# Patient Record
Sex: Female | Born: 1955 | Race: Black or African American | Hispanic: No | Smoking: Current every day smoker
Health system: Southern US, Community
[De-identification: ages and names within clinical notes are randomized; demographics above are authoritative.]

## PROBLEM LIST (undated history)

## (undated) DIAGNOSIS — M109 Gout, unspecified: Secondary | ICD-10-CM

## (undated) DIAGNOSIS — F329 Major depressive disorder, single episode, unspecified: Secondary | ICD-10-CM

## (undated) DIAGNOSIS — F209 Schizophrenia, unspecified: Secondary | ICD-10-CM

## (undated) DIAGNOSIS — I10 Essential (primary) hypertension: Secondary | ICD-10-CM

## (undated) DIAGNOSIS — M549 Dorsalgia, unspecified: Secondary | ICD-10-CM

## (undated) DIAGNOSIS — G8929 Other chronic pain: Secondary | ICD-10-CM

## (undated) DIAGNOSIS — M1990 Unspecified osteoarthritis, unspecified site: Secondary | ICD-10-CM

## (undated) DIAGNOSIS — E119 Type 2 diabetes mellitus without complications: Secondary | ICD-10-CM

## (undated) DIAGNOSIS — F32A Depression, unspecified: Secondary | ICD-10-CM

## (undated) HISTORY — DX: Essential (primary) hypertension: I10

## (undated) HISTORY — PX: BREAST BIOPSY: SHX20

---

## 1997-06-16 ENCOUNTER — Encounter: Admission: RE | Admit: 1997-06-16 | Discharge: 1997-06-16 | Payer: Self-pay | Admitting: Family Medicine

## 1997-07-09 ENCOUNTER — Encounter: Admission: RE | Admit: 1997-07-09 | Discharge: 1997-07-09 | Payer: Self-pay | Admitting: Family Medicine

## 1997-10-05 ENCOUNTER — Emergency Department (HOSPITAL_COMMUNITY): Admission: EM | Admit: 1997-10-05 | Discharge: 1997-10-05 | Payer: Self-pay | Admitting: Emergency Medicine

## 1997-10-15 ENCOUNTER — Ambulatory Visit (HOSPITAL_COMMUNITY): Admission: RE | Admit: 1997-10-15 | Discharge: 1997-10-16 | Payer: Self-pay | Admitting: General Surgery

## 1997-10-29 ENCOUNTER — Encounter: Payer: Self-pay | Admitting: General Surgery

## 1997-10-29 ENCOUNTER — Ambulatory Visit (HOSPITAL_COMMUNITY): Admission: RE | Admit: 1997-10-29 | Discharge: 1997-10-29 | Payer: Self-pay | Admitting: General Surgery

## 1998-01-04 ENCOUNTER — Other Ambulatory Visit: Admission: RE | Admit: 1998-01-04 | Discharge: 1998-01-04 | Payer: Self-pay | Admitting: *Deleted

## 1998-01-04 ENCOUNTER — Encounter: Admission: RE | Admit: 1998-01-04 | Discharge: 1998-01-04 | Payer: Self-pay | Admitting: Family Medicine

## 1998-02-17 ENCOUNTER — Encounter: Admission: RE | Admit: 1998-02-17 | Discharge: 1998-02-17 | Payer: Self-pay | Admitting: Family Medicine

## 1998-03-15 ENCOUNTER — Encounter: Admission: RE | Admit: 1998-03-15 | Discharge: 1998-03-15 | Payer: Self-pay | Admitting: Sports Medicine

## 1998-04-05 ENCOUNTER — Encounter: Admission: RE | Admit: 1998-04-05 | Discharge: 1998-04-05 | Payer: Self-pay | Admitting: Family Medicine

## 1998-04-12 ENCOUNTER — Encounter: Admission: RE | Admit: 1998-04-12 | Discharge: 1998-04-12 | Payer: Self-pay | Admitting: Sports Medicine

## 1998-04-19 ENCOUNTER — Encounter: Admission: RE | Admit: 1998-04-19 | Discharge: 1998-04-19 | Payer: Self-pay | Admitting: Sports Medicine

## 1998-04-26 ENCOUNTER — Encounter: Admission: RE | Admit: 1998-04-26 | Discharge: 1998-04-26 | Payer: Self-pay | Admitting: Sports Medicine

## 1998-05-03 ENCOUNTER — Encounter: Admission: RE | Admit: 1998-05-03 | Discharge: 1998-05-03 | Payer: Self-pay | Admitting: Family Medicine

## 1998-05-20 ENCOUNTER — Encounter: Admission: RE | Admit: 1998-05-20 | Discharge: 1998-05-20 | Payer: Self-pay | Admitting: Family Medicine

## 1998-07-18 ENCOUNTER — Encounter: Admission: RE | Admit: 1998-07-18 | Discharge: 1998-07-18 | Payer: Self-pay | Admitting: Family Medicine

## 1998-07-27 ENCOUNTER — Encounter: Admission: RE | Admit: 1998-07-27 | Discharge: 1998-07-27 | Payer: Self-pay | Admitting: Family Medicine

## 1998-08-04 ENCOUNTER — Encounter: Admission: RE | Admit: 1998-08-04 | Discharge: 1998-10-03 | Payer: Self-pay | Admitting: *Deleted

## 1998-08-29 ENCOUNTER — Encounter: Admission: RE | Admit: 1998-08-29 | Discharge: 1998-08-29 | Payer: Self-pay | Admitting: Family Medicine

## 1998-09-22 ENCOUNTER — Encounter: Admission: RE | Admit: 1998-09-22 | Discharge: 1998-09-22 | Payer: Self-pay | Admitting: Family Medicine

## 1998-10-04 ENCOUNTER — Encounter: Admission: RE | Admit: 1998-10-04 | Discharge: 1999-01-02 | Payer: Self-pay | Admitting: *Deleted

## 1998-10-14 ENCOUNTER — Encounter: Admission: RE | Admit: 1998-10-14 | Discharge: 1998-10-14 | Payer: Self-pay | Admitting: Family Medicine

## 1998-11-09 ENCOUNTER — Encounter: Admission: RE | Admit: 1998-11-09 | Discharge: 1998-11-09 | Payer: Self-pay | Admitting: Family Medicine

## 1998-11-23 ENCOUNTER — Encounter: Admission: RE | Admit: 1998-11-23 | Discharge: 1998-11-23 | Payer: Self-pay | Admitting: Family Medicine

## 1999-01-09 ENCOUNTER — Encounter: Admission: RE | Admit: 1999-01-09 | Discharge: 1999-01-09 | Payer: Self-pay | Admitting: Family Medicine

## 1999-01-16 ENCOUNTER — Encounter: Admission: RE | Admit: 1999-01-16 | Discharge: 1999-04-16 | Payer: Self-pay | Admitting: *Deleted

## 1999-01-19 ENCOUNTER — Encounter: Admission: RE | Admit: 1999-01-19 | Discharge: 1999-01-19 | Payer: Self-pay | Admitting: Family Medicine

## 1999-02-22 ENCOUNTER — Encounter: Admission: RE | Admit: 1999-02-22 | Discharge: 1999-02-22 | Payer: Self-pay | Admitting: Family Medicine

## 1999-03-17 ENCOUNTER — Encounter: Admission: RE | Admit: 1999-03-17 | Discharge: 1999-03-17 | Payer: Self-pay | Admitting: Family Medicine

## 1999-03-29 ENCOUNTER — Encounter: Admission: RE | Admit: 1999-03-29 | Discharge: 1999-03-29 | Payer: Self-pay | Admitting: Family Medicine

## 1999-04-24 ENCOUNTER — Encounter: Admission: RE | Admit: 1999-04-24 | Discharge: 1999-07-23 | Payer: Self-pay | Admitting: *Deleted

## 1999-05-03 ENCOUNTER — Encounter: Admission: RE | Admit: 1999-05-03 | Discharge: 1999-05-03 | Payer: Self-pay | Admitting: Family Medicine

## 1999-07-19 ENCOUNTER — Encounter: Admission: RE | Admit: 1999-07-19 | Discharge: 1999-07-19 | Payer: Self-pay | Admitting: Family Medicine

## 1999-07-24 ENCOUNTER — Encounter: Admission: RE | Admit: 1999-07-24 | Discharge: 1999-10-22 | Payer: Self-pay | Admitting: *Deleted

## 1999-07-26 ENCOUNTER — Encounter: Admission: RE | Admit: 1999-07-26 | Discharge: 1999-07-26 | Payer: Self-pay | Admitting: Family Medicine

## 1999-08-29 ENCOUNTER — Encounter: Admission: RE | Admit: 1999-08-29 | Discharge: 1999-08-29 | Payer: Self-pay | Admitting: Sports Medicine

## 1999-10-16 ENCOUNTER — Encounter: Admission: RE | Admit: 1999-10-16 | Discharge: 1999-10-16 | Payer: Self-pay | Admitting: Family Medicine

## 1999-10-24 ENCOUNTER — Encounter: Admission: RE | Admit: 1999-10-24 | Discharge: 2000-01-22 | Payer: Self-pay

## 1999-11-01 ENCOUNTER — Encounter: Admission: RE | Admit: 1999-11-01 | Discharge: 1999-11-01 | Payer: Self-pay | Admitting: Family Medicine

## 1999-11-17 ENCOUNTER — Encounter: Admission: RE | Admit: 1999-11-17 | Discharge: 1999-11-17 | Payer: Self-pay | Admitting: Family Medicine

## 1999-12-07 ENCOUNTER — Encounter: Admission: RE | Admit: 1999-12-07 | Discharge: 1999-12-07 | Payer: Self-pay | Admitting: Family Medicine

## 1999-12-19 ENCOUNTER — Encounter: Admission: RE | Admit: 1999-12-19 | Discharge: 1999-12-19 | Payer: Self-pay | Admitting: Family Medicine

## 2000-01-12 ENCOUNTER — Ambulatory Visit (HOSPITAL_COMMUNITY): Admission: RE | Admit: 2000-01-12 | Discharge: 2000-01-12 | Payer: Self-pay | Admitting: Family Medicine

## 2000-01-12 ENCOUNTER — Encounter: Admission: RE | Admit: 2000-01-12 | Discharge: 2000-01-12 | Payer: Self-pay | Admitting: Family Medicine

## 2000-01-12 ENCOUNTER — Encounter: Payer: Self-pay | Admitting: Family Medicine

## 2000-02-23 ENCOUNTER — Emergency Department (HOSPITAL_COMMUNITY): Admission: EM | Admit: 2000-02-23 | Discharge: 2000-02-23 | Payer: Self-pay | Admitting: Emergency Medicine

## 2000-03-04 ENCOUNTER — Encounter: Admission: RE | Admit: 2000-03-04 | Discharge: 2000-03-04 | Payer: Self-pay | Admitting: Family Medicine

## 2000-03-11 ENCOUNTER — Encounter: Admission: RE | Admit: 2000-03-11 | Discharge: 2000-03-11 | Payer: Self-pay | Admitting: Family Medicine

## 2000-03-29 ENCOUNTER — Encounter: Admission: RE | Admit: 2000-03-29 | Discharge: 2000-03-29 | Payer: Self-pay | Admitting: Family Medicine

## 2000-04-01 ENCOUNTER — Encounter: Admission: RE | Admit: 2000-04-01 | Discharge: 2000-04-01 | Payer: Self-pay | Admitting: Family Medicine

## 2000-04-02 ENCOUNTER — Encounter: Admission: RE | Admit: 2000-04-02 | Discharge: 2000-04-02 | Payer: Self-pay | Admitting: Family Medicine

## 2000-04-02 ENCOUNTER — Encounter: Payer: Self-pay | Admitting: Family Medicine

## 2000-04-08 ENCOUNTER — Encounter: Admission: RE | Admit: 2000-04-08 | Discharge: 2000-04-08 | Payer: Self-pay | Admitting: Family Medicine

## 2000-04-23 ENCOUNTER — Encounter: Admission: RE | Admit: 2000-04-23 | Discharge: 2000-04-23 | Payer: Self-pay | Admitting: Sports Medicine

## 2000-06-25 ENCOUNTER — Encounter: Admission: RE | Admit: 2000-06-25 | Discharge: 2000-06-25 | Payer: Self-pay | Admitting: Sports Medicine

## 2000-08-19 ENCOUNTER — Encounter: Admission: RE | Admit: 2000-08-19 | Discharge: 2000-08-19 | Payer: Self-pay | Admitting: Family Medicine

## 2000-09-03 ENCOUNTER — Encounter: Admission: RE | Admit: 2000-09-03 | Discharge: 2000-09-03 | Payer: Self-pay | Admitting: Family Medicine

## 2000-09-17 ENCOUNTER — Encounter: Admission: RE | Admit: 2000-09-17 | Discharge: 2000-09-17 | Payer: Self-pay | Admitting: Family Medicine

## 2000-09-19 ENCOUNTER — Inpatient Hospital Stay (HOSPITAL_COMMUNITY): Admission: EM | Admit: 2000-09-19 | Discharge: 2000-09-22 | Payer: Self-pay | Admitting: *Deleted

## 2000-09-19 ENCOUNTER — Encounter: Admission: RE | Admit: 2000-09-19 | Discharge: 2000-09-19 | Payer: Self-pay | Admitting: Family Medicine

## 2000-10-07 ENCOUNTER — Encounter: Admission: RE | Admit: 2000-10-07 | Discharge: 2000-10-07 | Payer: Self-pay | Admitting: Family Medicine

## 2000-10-18 ENCOUNTER — Encounter: Admission: RE | Admit: 2000-10-18 | Discharge: 2000-10-18 | Payer: Self-pay | Admitting: Family Medicine

## 2000-10-21 ENCOUNTER — Encounter: Payer: Self-pay | Admitting: Emergency Medicine

## 2000-10-21 ENCOUNTER — Emergency Department (HOSPITAL_COMMUNITY): Admission: EM | Admit: 2000-10-21 | Discharge: 2000-10-21 | Payer: Self-pay | Admitting: Emergency Medicine

## 2000-11-12 ENCOUNTER — Encounter: Admission: RE | Admit: 2000-11-12 | Discharge: 2000-11-12 | Payer: Self-pay | Admitting: Family Medicine

## 2000-11-14 ENCOUNTER — Encounter: Admission: RE | Admit: 2000-11-14 | Discharge: 2000-11-14 | Payer: Self-pay | Admitting: Family Medicine

## 2001-01-16 ENCOUNTER — Encounter: Admission: RE | Admit: 2001-01-16 | Discharge: 2001-01-16 | Payer: Self-pay | Admitting: Family Medicine

## 2001-02-17 ENCOUNTER — Encounter: Admission: RE | Admit: 2001-02-17 | Discharge: 2001-02-17 | Payer: Self-pay | Admitting: Family Medicine

## 2001-02-17 ENCOUNTER — Encounter: Payer: Self-pay | Admitting: Family Medicine

## 2001-03-14 ENCOUNTER — Encounter: Admission: RE | Admit: 2001-03-14 | Discharge: 2001-03-14 | Payer: Self-pay | Admitting: Family Medicine

## 2001-03-17 ENCOUNTER — Encounter: Admission: RE | Admit: 2001-03-17 | Discharge: 2001-03-17 | Payer: Self-pay | Admitting: Family Medicine

## 2001-03-25 ENCOUNTER — Encounter: Admission: RE | Admit: 2001-03-25 | Discharge: 2001-03-25 | Payer: Self-pay | Admitting: Family Medicine

## 2001-03-25 ENCOUNTER — Ambulatory Visit (HOSPITAL_COMMUNITY): Admission: RE | Admit: 2001-03-25 | Discharge: 2001-03-25 | Payer: Self-pay | Admitting: Family Medicine

## 2001-04-18 ENCOUNTER — Encounter: Admission: RE | Admit: 2001-04-18 | Discharge: 2001-04-18 | Payer: Self-pay | Admitting: Family Medicine

## 2001-06-04 ENCOUNTER — Encounter: Admission: RE | Admit: 2001-06-04 | Discharge: 2001-06-04 | Payer: Self-pay | Admitting: Family Medicine

## 2001-06-06 ENCOUNTER — Encounter: Admission: RE | Admit: 2001-06-06 | Discharge: 2001-06-06 | Payer: Self-pay | Admitting: Family Medicine

## 2001-07-03 ENCOUNTER — Encounter: Admission: RE | Admit: 2001-07-03 | Discharge: 2001-07-03 | Payer: Self-pay | Admitting: Family Medicine

## 2001-07-18 ENCOUNTER — Encounter: Admission: RE | Admit: 2001-07-18 | Discharge: 2001-07-18 | Payer: Self-pay | Admitting: Family Medicine

## 2001-08-05 ENCOUNTER — Encounter: Admission: RE | Admit: 2001-08-05 | Discharge: 2001-08-05 | Payer: Self-pay | Admitting: Family Medicine

## 2001-10-06 ENCOUNTER — Encounter: Admission: RE | Admit: 2001-10-06 | Discharge: 2001-10-06 | Payer: Self-pay | Admitting: Family Medicine

## 2001-11-07 ENCOUNTER — Encounter: Admission: RE | Admit: 2001-11-07 | Discharge: 2001-11-07 | Payer: Self-pay | Admitting: Family Medicine

## 2001-11-18 ENCOUNTER — Encounter: Admission: RE | Admit: 2001-11-18 | Discharge: 2001-11-18 | Payer: Self-pay | Admitting: Sports Medicine

## 2001-12-01 ENCOUNTER — Encounter: Admission: RE | Admit: 2001-12-01 | Discharge: 2001-12-01 | Payer: Self-pay | Admitting: Sports Medicine

## 2001-12-16 ENCOUNTER — Encounter: Admission: RE | Admit: 2001-12-16 | Discharge: 2001-12-16 | Payer: Self-pay | Admitting: *Deleted

## 2001-12-16 ENCOUNTER — Encounter: Admission: RE | Admit: 2001-12-16 | Discharge: 2001-12-16 | Payer: Self-pay | Admitting: Family Medicine

## 2001-12-16 ENCOUNTER — Encounter: Payer: Self-pay | Admitting: Sports Medicine

## 2002-01-02 ENCOUNTER — Encounter: Admission: RE | Admit: 2002-01-02 | Discharge: 2002-01-02 | Payer: Self-pay | Admitting: Family Medicine

## 2002-02-19 ENCOUNTER — Encounter: Payer: Self-pay | Admitting: Sports Medicine

## 2002-02-19 ENCOUNTER — Encounter: Admission: RE | Admit: 2002-02-19 | Discharge: 2002-02-19 | Payer: Self-pay | Admitting: Sports Medicine

## 2002-05-22 ENCOUNTER — Encounter: Admission: RE | Admit: 2002-05-22 | Discharge: 2002-05-22 | Payer: Self-pay | Admitting: Family Medicine

## 2002-06-01 ENCOUNTER — Encounter: Admission: RE | Admit: 2002-06-01 | Discharge: 2002-06-01 | Payer: Self-pay | Admitting: Family Medicine

## 2002-06-15 ENCOUNTER — Encounter: Admission: RE | Admit: 2002-06-15 | Discharge: 2002-06-15 | Payer: Self-pay | Admitting: Family Medicine

## 2002-06-25 ENCOUNTER — Encounter: Admission: RE | Admit: 2002-06-25 | Discharge: 2002-06-25 | Payer: Self-pay | Admitting: Family Medicine

## 2002-07-21 ENCOUNTER — Encounter: Admission: RE | Admit: 2002-07-21 | Discharge: 2002-07-21 | Payer: Self-pay | Admitting: Family Medicine

## 2003-05-10 ENCOUNTER — Encounter: Admission: RE | Admit: 2003-05-10 | Discharge: 2003-05-10 | Payer: Self-pay | Admitting: Sports Medicine

## 2003-05-14 ENCOUNTER — Encounter: Admission: RE | Admit: 2003-05-14 | Discharge: 2003-05-14 | Payer: Self-pay | Admitting: Family Medicine

## 2003-07-04 ENCOUNTER — Encounter (INDEPENDENT_AMBULATORY_CARE_PROVIDER_SITE_OTHER): Payer: Self-pay | Admitting: *Deleted

## 2003-07-04 LAB — CONVERTED CEMR LAB

## 2003-08-10 ENCOUNTER — Encounter: Admission: RE | Admit: 2003-08-10 | Discharge: 2003-08-10 | Payer: Self-pay | Admitting: Sports Medicine

## 2004-02-23 ENCOUNTER — Ambulatory Visit: Payer: Self-pay | Admitting: Family Medicine

## 2004-04-09 ENCOUNTER — Emergency Department (HOSPITAL_COMMUNITY): Admission: EM | Admit: 2004-04-09 | Discharge: 2004-04-09 | Payer: Self-pay | Admitting: Family Medicine

## 2004-04-18 ENCOUNTER — Ambulatory Visit: Payer: Self-pay | Admitting: Family Medicine

## 2004-04-28 ENCOUNTER — Ambulatory Visit: Payer: Self-pay | Admitting: Family Medicine

## 2004-05-10 ENCOUNTER — Encounter: Admission: RE | Admit: 2004-05-10 | Discharge: 2004-05-10 | Payer: Self-pay | Admitting: Sports Medicine

## 2004-05-24 ENCOUNTER — Ambulatory Visit: Payer: Self-pay | Admitting: Family Medicine

## 2004-06-06 ENCOUNTER — Ambulatory Visit: Payer: Self-pay | Admitting: Family Medicine

## 2004-07-04 ENCOUNTER — Ambulatory Visit: Payer: Self-pay | Admitting: Family Medicine

## 2004-07-14 ENCOUNTER — Ambulatory Visit: Payer: Self-pay | Admitting: Family Medicine

## 2004-07-24 ENCOUNTER — Ambulatory Visit: Payer: Self-pay | Admitting: Family Medicine

## 2004-08-01 ENCOUNTER — Ambulatory Visit: Payer: Self-pay | Admitting: Family Medicine

## 2004-08-16 ENCOUNTER — Ambulatory Visit: Payer: Self-pay | Admitting: Family Medicine

## 2004-08-31 ENCOUNTER — Ambulatory Visit: Payer: Self-pay | Admitting: Sports Medicine

## 2004-09-08 ENCOUNTER — Ambulatory Visit: Payer: Self-pay | Admitting: Family Medicine

## 2004-10-06 ENCOUNTER — Ambulatory Visit: Payer: Self-pay | Admitting: Family Medicine

## 2004-10-10 ENCOUNTER — Ambulatory Visit: Payer: Self-pay | Admitting: Family Medicine

## 2004-11-02 ENCOUNTER — Ambulatory Visit: Payer: Self-pay | Admitting: Family Medicine

## 2004-11-30 ENCOUNTER — Ambulatory Visit: Payer: Self-pay | Admitting: Family Medicine

## 2004-12-06 ENCOUNTER — Emergency Department (HOSPITAL_COMMUNITY): Admission: EM | Admit: 2004-12-06 | Discharge: 2004-12-06 | Payer: Self-pay | Admitting: Family Medicine

## 2004-12-08 ENCOUNTER — Ambulatory Visit: Payer: Self-pay | Admitting: Family Medicine

## 2005-01-24 ENCOUNTER — Ambulatory Visit: Payer: Self-pay | Admitting: Family Medicine

## 2005-02-20 ENCOUNTER — Ambulatory Visit: Payer: Self-pay | Admitting: Sports Medicine

## 2005-02-20 ENCOUNTER — Ambulatory Visit (HOSPITAL_COMMUNITY): Admission: RE | Admit: 2005-02-20 | Discharge: 2005-02-20 | Payer: Self-pay | Admitting: Family Medicine

## 2005-02-21 ENCOUNTER — Emergency Department (HOSPITAL_COMMUNITY): Admission: EM | Admit: 2005-02-21 | Discharge: 2005-02-21 | Payer: Self-pay | Admitting: Emergency Medicine

## 2005-02-23 ENCOUNTER — Ambulatory Visit: Payer: Self-pay | Admitting: Sports Medicine

## 2005-03-05 ENCOUNTER — Emergency Department (HOSPITAL_COMMUNITY): Admission: EM | Admit: 2005-03-05 | Discharge: 2005-03-05 | Payer: Self-pay | Admitting: Family Medicine

## 2005-03-08 ENCOUNTER — Ambulatory Visit: Payer: Self-pay | Admitting: Family Medicine

## 2005-04-09 ENCOUNTER — Ambulatory Visit: Payer: Self-pay | Admitting: Family Medicine

## 2005-04-24 ENCOUNTER — Ambulatory Visit: Payer: Self-pay | Admitting: Family Medicine

## 2005-04-26 ENCOUNTER — Ambulatory Visit: Payer: Self-pay | Admitting: Family Medicine

## 2005-05-14 ENCOUNTER — Encounter: Admission: RE | Admit: 2005-05-14 | Discharge: 2005-05-14 | Payer: Self-pay | Admitting: Sports Medicine

## 2005-05-21 ENCOUNTER — Encounter: Admission: RE | Admit: 2005-05-21 | Discharge: 2005-05-21 | Payer: Self-pay | Admitting: Sports Medicine

## 2005-05-21 ENCOUNTER — Encounter (INDEPENDENT_AMBULATORY_CARE_PROVIDER_SITE_OTHER): Payer: Self-pay | Admitting: *Deleted

## 2005-06-16 ENCOUNTER — Emergency Department (HOSPITAL_COMMUNITY): Admission: EM | Admit: 2005-06-16 | Discharge: 2005-06-16 | Payer: Self-pay | Admitting: Emergency Medicine

## 2005-06-22 ENCOUNTER — Emergency Department (HOSPITAL_COMMUNITY): Admission: EM | Admit: 2005-06-22 | Discharge: 2005-06-22 | Payer: Self-pay | Admitting: Family Medicine

## 2005-07-10 ENCOUNTER — Ambulatory Visit: Payer: Self-pay | Admitting: Sports Medicine

## 2005-07-16 ENCOUNTER — Ambulatory Visit: Payer: Self-pay | Admitting: Family Medicine

## 2005-08-26 ENCOUNTER — Emergency Department (HOSPITAL_COMMUNITY): Admission: EM | Admit: 2005-08-26 | Discharge: 2005-08-26 | Payer: Self-pay | Admitting: Emergency Medicine

## 2005-08-28 ENCOUNTER — Ambulatory Visit: Payer: Self-pay | Admitting: Family Medicine

## 2005-09-24 ENCOUNTER — Ambulatory Visit: Payer: Self-pay | Admitting: Family Medicine

## 2005-11-01 ENCOUNTER — Ambulatory Visit: Payer: Self-pay | Admitting: Family Medicine

## 2005-11-29 ENCOUNTER — Ambulatory Visit: Payer: Self-pay | Admitting: Sports Medicine

## 2005-12-25 ENCOUNTER — Ambulatory Visit: Payer: Self-pay | Admitting: Family Medicine

## 2006-01-15 ENCOUNTER — Encounter: Admission: RE | Admit: 2006-01-15 | Discharge: 2006-01-15 | Payer: Self-pay | Admitting: Sports Medicine

## 2006-01-15 ENCOUNTER — Ambulatory Visit: Payer: Self-pay | Admitting: Family Medicine

## 2006-02-01 ENCOUNTER — Encounter: Admission: RE | Admit: 2006-02-01 | Discharge: 2006-02-12 | Payer: Self-pay | Admitting: Family Medicine

## 2006-03-19 ENCOUNTER — Ambulatory Visit: Payer: Self-pay | Admitting: Family Medicine

## 2006-03-28 ENCOUNTER — Ambulatory Visit: Payer: Self-pay | Admitting: Family Medicine

## 2006-04-09 ENCOUNTER — Encounter: Payer: Self-pay | Admitting: Family Medicine

## 2006-04-09 ENCOUNTER — Ambulatory Visit: Payer: Self-pay | Admitting: Family Medicine

## 2006-04-09 LAB — CONVERTED CEMR LAB
AST: 14 units/L (ref 0–37)
Alkaline Phosphatase: 45 units/L (ref 39–117)
BUN: 11 mg/dL (ref 6–23)
CO2: 29 meq/L (ref 19–32)
Chloride: 99 meq/L (ref 96–112)
Creatinine, Ser: 0.87 mg/dL (ref 0.40–1.20)
Glucose, Bld: 103 mg/dL — ABNORMAL HIGH (ref 70–99)
Potassium: 3.9 meq/L (ref 3.5–5.3)
Total Bilirubin: 0.3 mg/dL (ref 0.3–1.2)
Total Protein: 7 g/dL (ref 6.0–8.3)

## 2006-05-02 DIAGNOSIS — I10 Essential (primary) hypertension: Secondary | ICD-10-CM | POA: Insufficient documentation

## 2006-05-02 DIAGNOSIS — K219 Gastro-esophageal reflux disease without esophagitis: Secondary | ICD-10-CM

## 2006-05-02 DIAGNOSIS — E669 Obesity, unspecified: Secondary | ICD-10-CM

## 2006-05-02 DIAGNOSIS — F209 Schizophrenia, unspecified: Secondary | ICD-10-CM | POA: Insufficient documentation

## 2006-05-03 ENCOUNTER — Encounter (INDEPENDENT_AMBULATORY_CARE_PROVIDER_SITE_OTHER): Payer: Self-pay | Admitting: *Deleted

## 2006-05-06 ENCOUNTER — Emergency Department (HOSPITAL_COMMUNITY): Admission: EM | Admit: 2006-05-06 | Discharge: 2006-05-06 | Payer: Self-pay | Admitting: Family Medicine

## 2006-05-07 ENCOUNTER — Telehealth: Payer: Self-pay | Admitting: *Deleted

## 2006-05-09 ENCOUNTER — Ambulatory Visit: Payer: Self-pay | Admitting: Sports Medicine

## 2006-05-13 ENCOUNTER — Telehealth: Payer: Self-pay | Admitting: Family Medicine

## 2006-05-16 ENCOUNTER — Telehealth: Payer: Self-pay | Admitting: Family Medicine

## 2006-05-16 ENCOUNTER — Encounter: Admission: RE | Admit: 2006-05-16 | Discharge: 2006-05-16 | Payer: Self-pay | Admitting: Sports Medicine

## 2006-05-16 ENCOUNTER — Emergency Department (HOSPITAL_COMMUNITY): Admission: EM | Admit: 2006-05-16 | Discharge: 2006-05-16 | Payer: Self-pay | Admitting: Family Medicine

## 2006-05-20 ENCOUNTER — Telehealth: Payer: Self-pay | Admitting: *Deleted

## 2006-05-20 ENCOUNTER — Emergency Department (HOSPITAL_COMMUNITY): Admission: EM | Admit: 2006-05-20 | Discharge: 2006-05-20 | Payer: Self-pay | Admitting: Family Medicine

## 2006-05-21 ENCOUNTER — Ambulatory Visit: Payer: Self-pay | Admitting: Family Medicine

## 2006-05-21 ENCOUNTER — Telehealth: Payer: Self-pay | Admitting: *Deleted

## 2006-05-23 ENCOUNTER — Telehealth: Payer: Self-pay | Admitting: Family Medicine

## 2006-05-24 ENCOUNTER — Telehealth: Payer: Self-pay | Admitting: *Deleted

## 2006-06-07 ENCOUNTER — Ambulatory Visit: Payer: Self-pay | Admitting: Family Medicine

## 2006-06-10 ENCOUNTER — Telehealth (INDEPENDENT_AMBULATORY_CARE_PROVIDER_SITE_OTHER): Payer: Self-pay | Admitting: *Deleted

## 2006-06-10 ENCOUNTER — Ambulatory Visit: Payer: Self-pay | Admitting: Family Medicine

## 2006-06-10 ENCOUNTER — Telehealth: Payer: Self-pay | Admitting: *Deleted

## 2006-06-11 ENCOUNTER — Telehealth: Payer: Self-pay | Admitting: *Deleted

## 2006-06-12 ENCOUNTER — Encounter: Payer: Self-pay | Admitting: Family Medicine

## 2006-06-12 ENCOUNTER — Ambulatory Visit: Payer: Self-pay | Admitting: Family Medicine

## 2006-06-12 LAB — CONVERTED CEMR LAB
ALT: 27 units/L (ref 0–35)
Albumin: 4.3 g/dL (ref 3.5–5.2)
Alkaline Phosphatase: 89 units/L (ref 39–117)
Basophils Absolute: <0.2 10*3/uL
Bilirubin Urine: NEGATIVE
Blood in Urine, dipstick: NEGATIVE
Creatinine, Ser: 0.75 mg/dL (ref 0.40–1.20)
Glucose, Bld: 102 mg/dL — ABNORMAL HIGH (ref 70–99)
Granulocyte count absolute: 3.8 10*3/uL
Granulocyte percent: 52.2 %
HCT: 40.2 %
Hemoglobin: 14 g/dL
Lymphocytes Relative: 40.5 %
MCV: 85 fL
Monocytes Absolute: 0.5 10*3/uL
Potassium: 3.8 meq/L (ref 3.5–5.3)
Protein, U semiquant: NEGATIVE
Specific Gravity, Urine: 1.015
Total Protein: 7.6 g/dL (ref 6.0–8.3)
Urobilinogen, UA: 0.2

## 2006-07-12 ENCOUNTER — Telehealth: Payer: Self-pay | Admitting: Family Medicine

## 2006-07-26 ENCOUNTER — Telehealth: Payer: Self-pay | Admitting: *Deleted

## 2006-07-27 ENCOUNTER — Emergency Department (HOSPITAL_COMMUNITY): Admission: EM | Admit: 2006-07-27 | Discharge: 2006-07-27 | Payer: Self-pay | Admitting: Emergency Medicine

## 2006-08-10 IMAGING — CR DG CHEST 2V
2 series · 2 of 2 positions shown · non-contrast
Comparison: 02/21/2005.

CLINICAL DATA: Chest pain for several months.    
 CHEST - 2 VIEW:

[w chest pa]
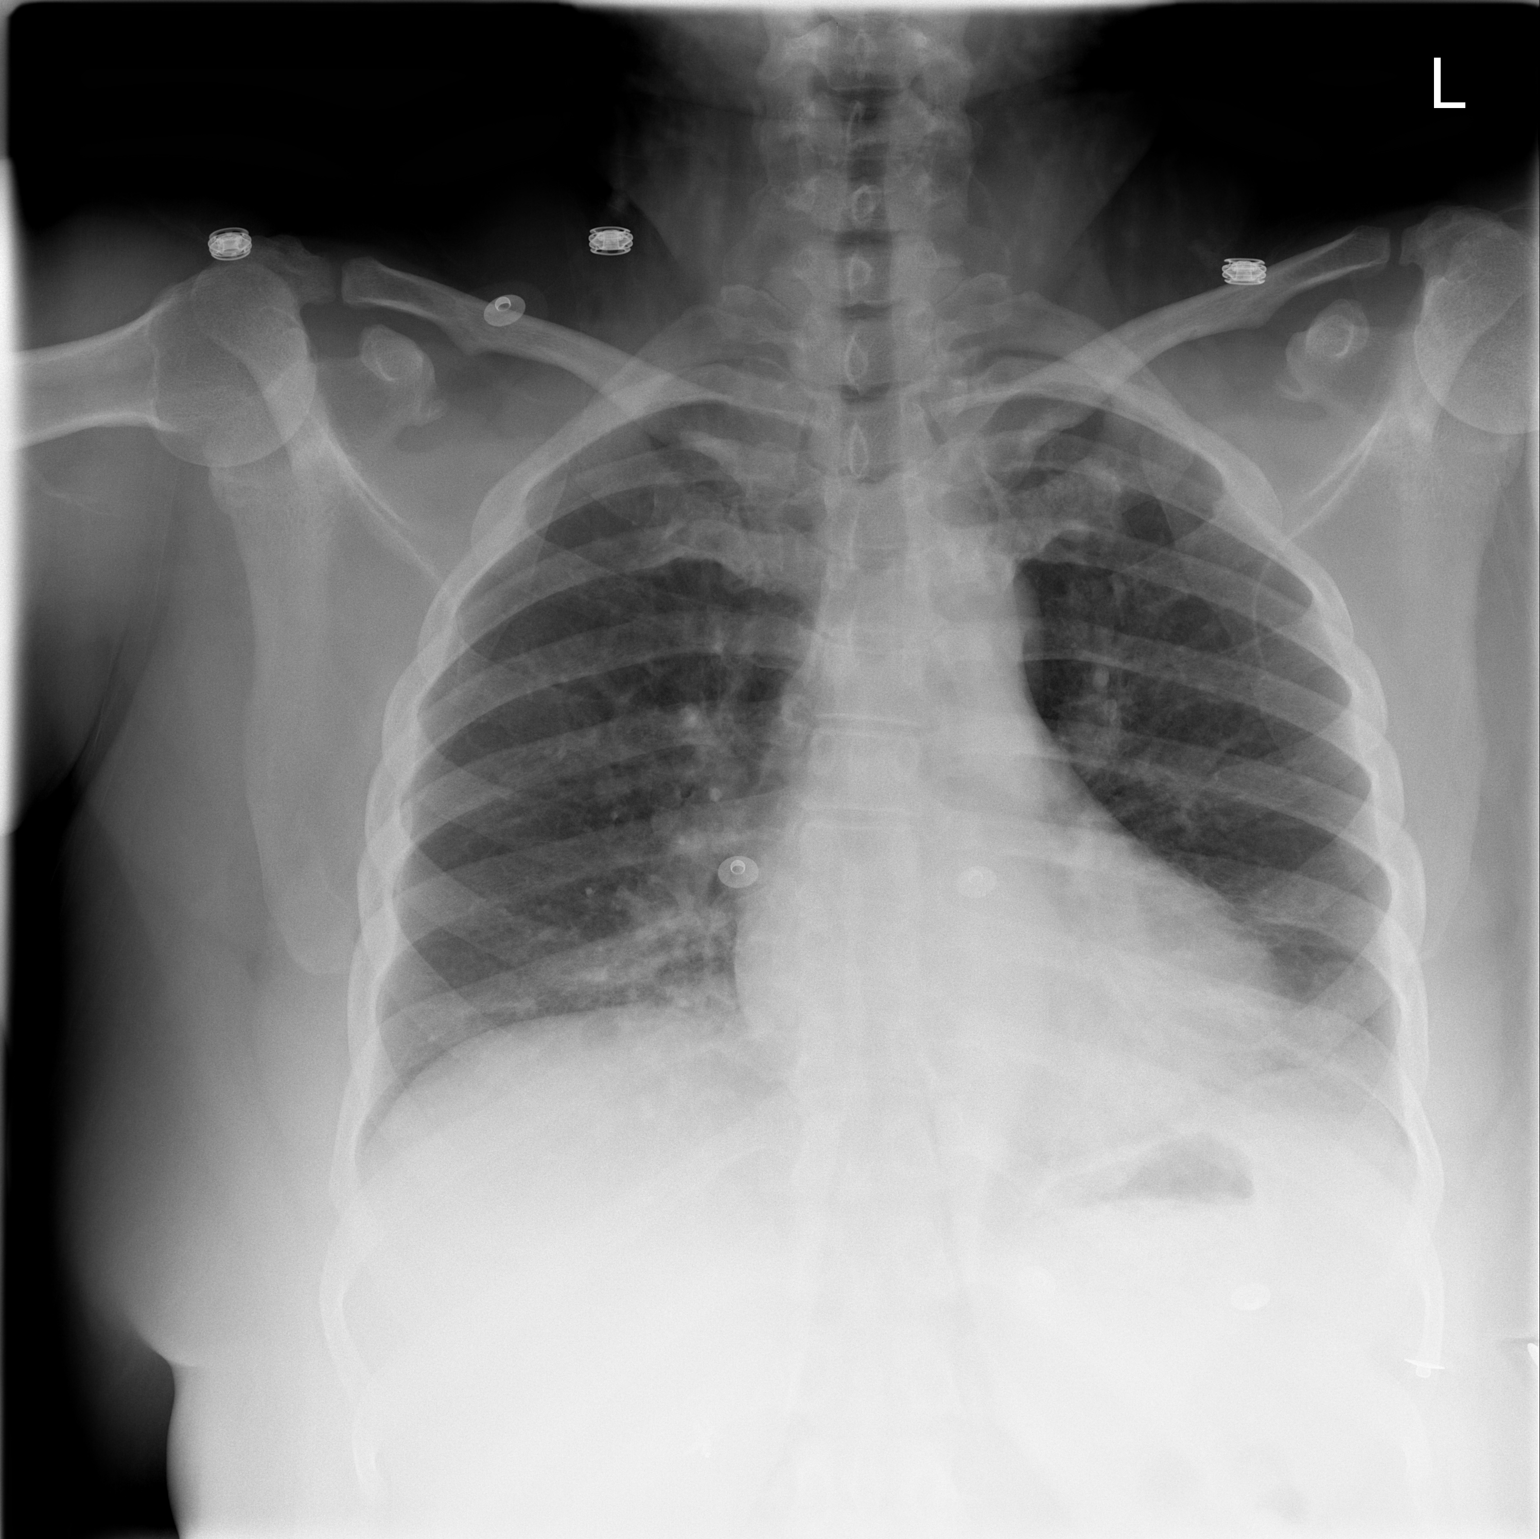

[w chest lat]
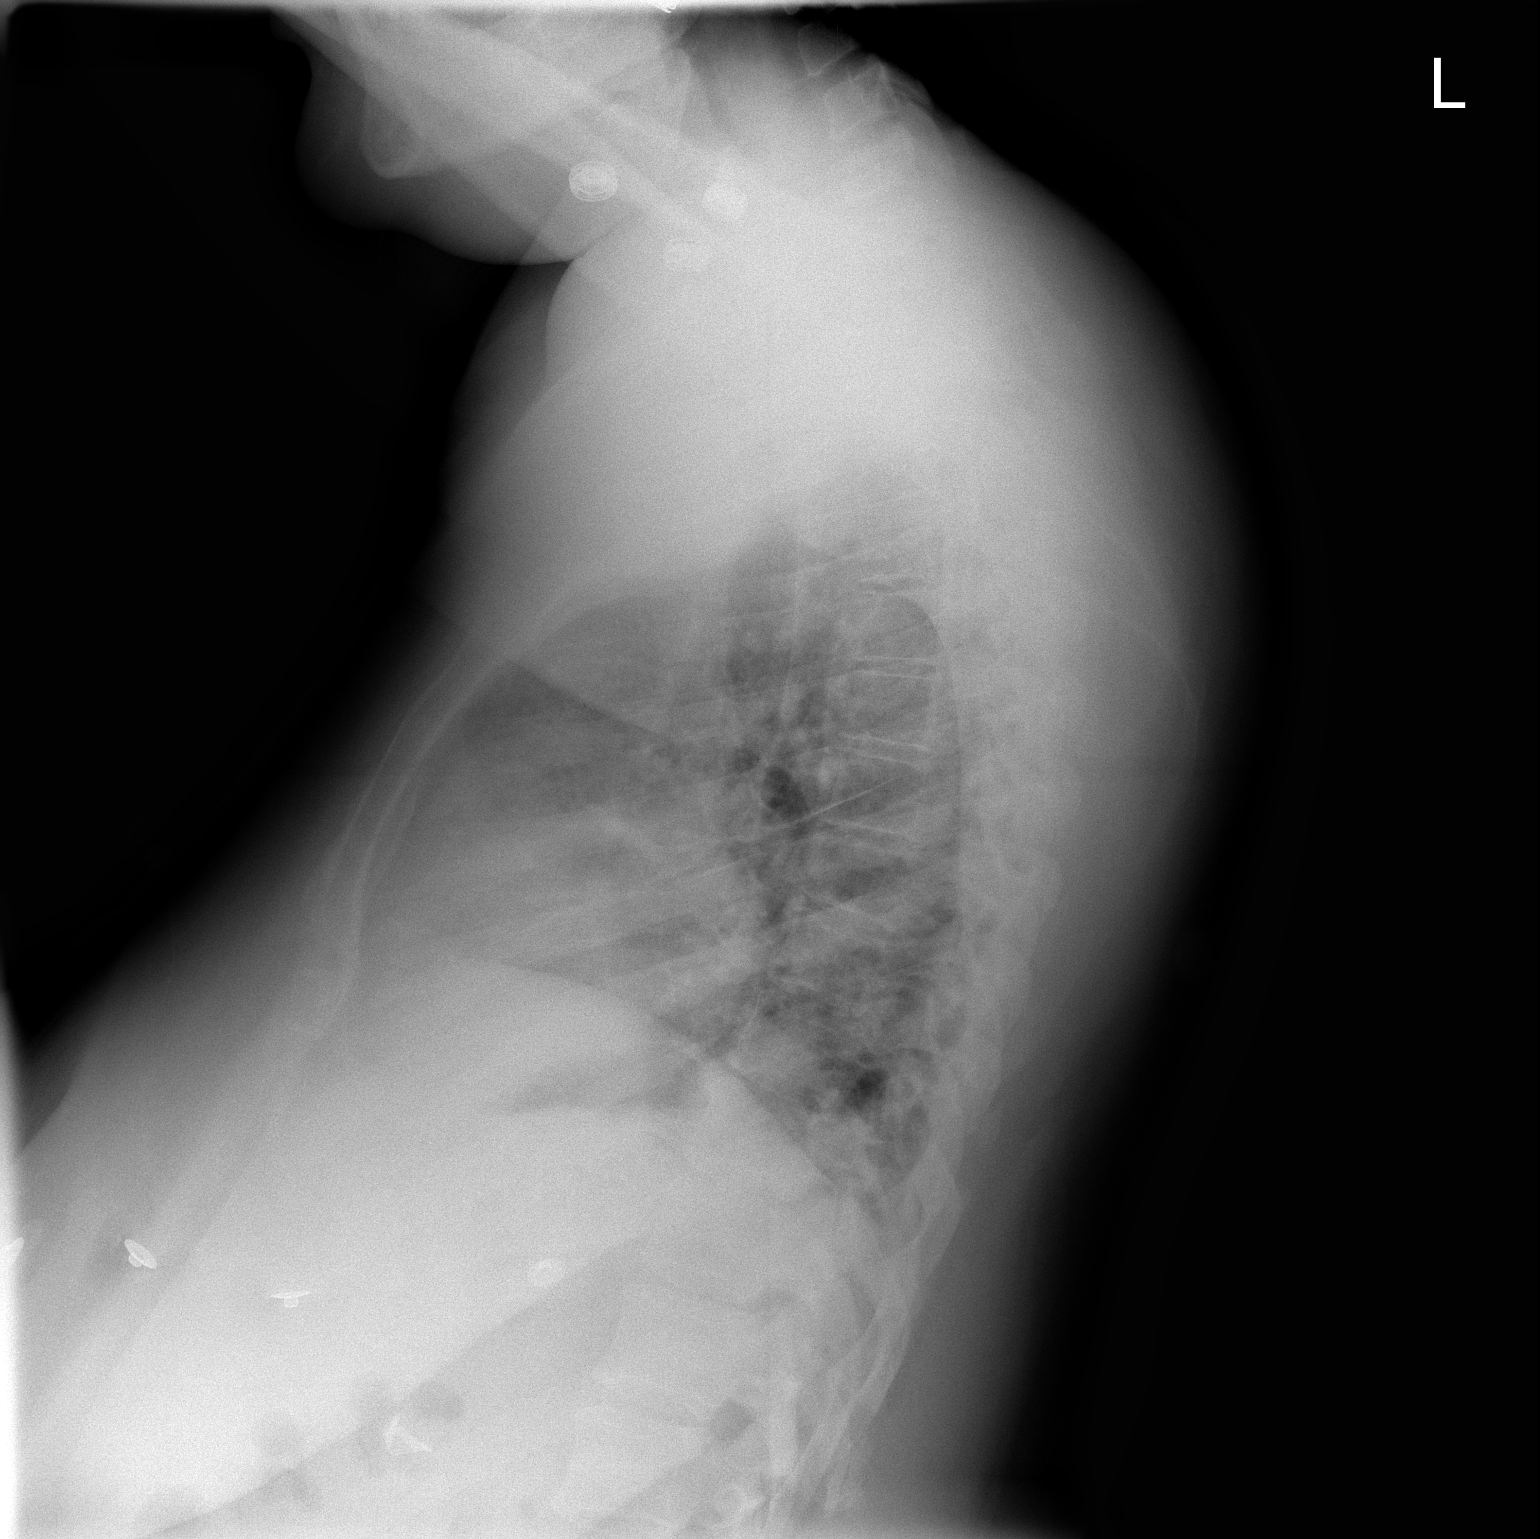

[2 of 2 positions shown; findings below may reference images not displayed]

FINDINGS: Heart size is normal.  There is bibasilar airspace disease which may be due to atelectasis and/or pneumonia.  No effusions noted.
IMPRESSION: Bibasilar airspace disease which may be due to atelectasis or infiltrate.

## 2006-08-20 ENCOUNTER — Telehealth: Payer: Self-pay | Admitting: *Deleted

## 2006-08-21 ENCOUNTER — Ambulatory Visit: Payer: Self-pay | Admitting: Family Medicine

## 2006-08-26 ENCOUNTER — Telehealth: Payer: Self-pay | Admitting: Family Medicine

## 2006-09-03 ENCOUNTER — Ambulatory Visit: Payer: Self-pay | Admitting: Family Medicine

## 2006-09-13 ENCOUNTER — Telehealth: Payer: Self-pay | Admitting: *Deleted

## 2006-09-16 ENCOUNTER — Ambulatory Visit: Payer: Self-pay | Admitting: Family Medicine

## 2006-09-20 ENCOUNTER — Telehealth: Payer: Self-pay | Admitting: Family Medicine

## 2006-10-12 ENCOUNTER — Emergency Department (HOSPITAL_COMMUNITY): Admission: EM | Admit: 2006-10-12 | Discharge: 2006-10-12 | Payer: Self-pay | Admitting: Family Medicine

## 2006-11-08 ENCOUNTER — Ambulatory Visit: Payer: Self-pay | Admitting: Family Medicine

## 2006-12-03 ENCOUNTER — Emergency Department (HOSPITAL_COMMUNITY): Admission: EM | Admit: 2006-12-03 | Discharge: 2006-12-03 | Payer: Self-pay | Admitting: Emergency Medicine

## 2006-12-12 ENCOUNTER — Telehealth (INDEPENDENT_AMBULATORY_CARE_PROVIDER_SITE_OTHER): Payer: Self-pay | Admitting: *Deleted

## 2006-12-12 ENCOUNTER — Encounter: Payer: Self-pay | Admitting: Family Medicine

## 2006-12-12 ENCOUNTER — Ambulatory Visit: Payer: Self-pay | Admitting: Family Medicine

## 2006-12-12 LAB — CONVERTED CEMR LAB
ALT: 13 units/L (ref 0–35)
AST: 15 units/L (ref 0–37)
BUN: 9 mg/dL (ref 6–23)
Basophils Absolute: 0 10*3/uL (ref 0.0–0.1)
Basophils Relative: 1 % (ref 0–1)
CO2: 28 meq/L (ref 19–32)
Creatinine, Ser: 0.77 mg/dL (ref 0.40–1.20)
Eosinophils Relative: 3 % (ref 0–5)
Glucose, Bld: 93 mg/dL (ref 70–99)
MCHC: 32.9 g/dL (ref 30.0–36.0)
Monocytes Absolute: 0.5 10*3/uL (ref 0.2–0.7)
Neutrophils Relative %: 45 % (ref 43–77)
RBC: 5.13 M/uL — ABNORMAL HIGH (ref 3.87–5.11)
WBC: 8.1 10*3/uL (ref 4.0–10.5)

## 2006-12-20 ENCOUNTER — Encounter: Payer: Self-pay | Admitting: Family Medicine

## 2006-12-27 ENCOUNTER — Telehealth: Payer: Self-pay | Admitting: *Deleted

## 2007-01-10 ENCOUNTER — Ambulatory Visit: Payer: Self-pay | Admitting: Family Medicine

## 2007-01-10 ENCOUNTER — Encounter: Payer: Self-pay | Admitting: Family Medicine

## 2007-01-10 LAB — CONVERTED CEMR LAB
ALT: 13 units/L (ref 0–35)
AST: 14 units/L (ref 0–37)
Albumin: 4.1 g/dL (ref 3.5–5.2)
Alkaline Phosphatase: 65 units/L (ref 39–117)
Blood in Urine, dipstick: NEGATIVE
Calcium: 9 mg/dL (ref 8.4–10.5)
Glucose, Bld: 96 mg/dL (ref 70–99)
Ketones, urine, test strip: NEGATIVE
MCHC: 32.5 g/dL (ref 30.0–36.0)
MCV: 87 fL (ref 78.0–100.0)
Potassium: 3.9 meq/L (ref 3.5–5.3)
Protein, U semiquant: NEGATIVE
RBC: 4.77 M/uL (ref 3.87–5.11)
Specific Gravity, Urine: 1.01
Total Bilirubin: 0.3 mg/dL (ref 0.3–1.2)
WBC: 8.2 10*3/uL (ref 4.0–10.5)
Whiff Test: NEGATIVE
pH: 5.5

## 2007-01-13 ENCOUNTER — Emergency Department (HOSPITAL_COMMUNITY): Admission: EM | Admit: 2007-01-13 | Discharge: 2007-01-13 | Payer: Self-pay | Admitting: Emergency Medicine

## 2007-01-24 ENCOUNTER — Encounter: Payer: Self-pay | Admitting: *Deleted

## 2007-01-24 ENCOUNTER — Telehealth: Payer: Self-pay | Admitting: Family Medicine

## 2007-01-24 ENCOUNTER — Emergency Department (HOSPITAL_COMMUNITY): Admission: EM | Admit: 2007-01-24 | Discharge: 2007-01-24 | Payer: Self-pay | Admitting: Family Medicine

## 2007-01-28 ENCOUNTER — Encounter: Admission: RE | Admit: 2007-01-28 | Discharge: 2007-01-28 | Payer: Self-pay | Admitting: Family Medicine

## 2007-02-03 ENCOUNTER — Telehealth: Payer: Self-pay | Admitting: Family Medicine

## 2007-02-03 ENCOUNTER — Encounter: Payer: Self-pay | Admitting: Family Medicine

## 2007-03-05 ENCOUNTER — Ambulatory Visit: Payer: Self-pay | Admitting: Family Medicine

## 2007-03-16 ENCOUNTER — Telehealth (INDEPENDENT_AMBULATORY_CARE_PROVIDER_SITE_OTHER): Payer: Self-pay | Admitting: *Deleted

## 2007-03-19 ENCOUNTER — Telehealth: Payer: Self-pay | Admitting: *Deleted

## 2007-04-01 ENCOUNTER — Ambulatory Visit: Payer: Self-pay | Admitting: Family Medicine

## 2007-04-01 ENCOUNTER — Encounter: Payer: Self-pay | Admitting: Family Medicine

## 2007-04-01 LAB — CONVERTED CEMR LAB
CO2: 25 meq/L (ref 19–32)
LDL Cholesterol: 87 mg/dL
Potassium: 4.3 meq/L (ref 3.5–5.3)
Sodium: 140 meq/L (ref 135–145)

## 2007-04-04 ENCOUNTER — Telehealth: Payer: Self-pay | Admitting: *Deleted

## 2007-04-25 ENCOUNTER — Telehealth: Payer: Self-pay | Admitting: Family Medicine

## 2007-04-25 ENCOUNTER — Encounter: Payer: Self-pay | Admitting: Family Medicine

## 2007-04-25 ENCOUNTER — Ambulatory Visit: Payer: Self-pay | Admitting: Family Medicine

## 2007-05-01 ENCOUNTER — Telehealth: Payer: Self-pay | Admitting: Family Medicine

## 2007-05-01 LAB — CONVERTED CEMR LAB: Pap Smear: NORMAL

## 2007-05-16 ENCOUNTER — Ambulatory Visit: Payer: Self-pay | Admitting: Family Medicine

## 2007-05-16 ENCOUNTER — Telehealth: Payer: Self-pay | Admitting: Family Medicine

## 2007-05-16 LAB — CONVERTED CEMR LAB
Bilirubin Urine: NEGATIVE
Blood in Urine, dipstick: NEGATIVE
Glucose, Urine, Semiquant: NEGATIVE
Nitrite: NEGATIVE
Protein, U semiquant: NEGATIVE

## 2007-05-19 ENCOUNTER — Encounter: Admission: RE | Admit: 2007-05-19 | Discharge: 2007-05-19 | Payer: Self-pay | Admitting: Sports Medicine

## 2007-05-21 ENCOUNTER — Encounter: Payer: Self-pay | Admitting: Family Medicine

## 2007-05-27 ENCOUNTER — Telehealth: Payer: Self-pay | Admitting: *Deleted

## 2007-05-27 ENCOUNTER — Encounter: Payer: Self-pay | Admitting: *Deleted

## 2007-05-28 ENCOUNTER — Emergency Department (HOSPITAL_COMMUNITY): Admission: EM | Admit: 2007-05-28 | Discharge: 2007-05-28 | Payer: Self-pay | Admitting: Family Medicine

## 2007-05-28 ENCOUNTER — Telehealth: Payer: Self-pay | Admitting: Family Medicine

## 2007-05-29 ENCOUNTER — Ambulatory Visit: Payer: Self-pay | Admitting: Family Medicine

## 2007-06-01 ENCOUNTER — Emergency Department (HOSPITAL_COMMUNITY): Admission: EM | Admit: 2007-06-01 | Discharge: 2007-06-01 | Payer: Self-pay | Admitting: Family Medicine

## 2007-06-03 ENCOUNTER — Telehealth: Payer: Self-pay | Admitting: Family Medicine

## 2007-06-05 ENCOUNTER — Ambulatory Visit: Payer: Self-pay | Admitting: Family Medicine

## 2007-06-06 ENCOUNTER — Telehealth: Payer: Self-pay | Admitting: *Deleted

## 2007-06-09 ENCOUNTER — Telehealth: Payer: Self-pay | Admitting: *Deleted

## 2007-06-09 ENCOUNTER — Ambulatory Visit: Payer: Self-pay | Admitting: Sports Medicine

## 2007-06-27 ENCOUNTER — Ambulatory Visit: Payer: Self-pay | Admitting: Family Medicine

## 2007-07-03 ENCOUNTER — Ambulatory Visit: Payer: Self-pay | Admitting: Family Medicine

## 2007-07-06 ENCOUNTER — Emergency Department (HOSPITAL_COMMUNITY): Admission: EM | Admit: 2007-07-06 | Discharge: 2007-07-06 | Payer: Self-pay | Admitting: Family Medicine

## 2007-07-29 ENCOUNTER — Emergency Department (HOSPITAL_COMMUNITY): Admission: EM | Admit: 2007-07-29 | Discharge: 2007-07-29 | Payer: Self-pay | Admitting: Family Medicine

## 2007-07-29 ENCOUNTER — Telehealth: Payer: Self-pay | Admitting: *Deleted

## 2007-08-11 ENCOUNTER — Ambulatory Visit: Payer: Self-pay | Admitting: Family Medicine

## 2007-08-15 ENCOUNTER — Emergency Department (HOSPITAL_COMMUNITY): Admission: EM | Admit: 2007-08-15 | Discharge: 2007-08-15 | Payer: Self-pay | Admitting: Emergency Medicine

## 2007-08-15 ENCOUNTER — Telehealth (INDEPENDENT_AMBULATORY_CARE_PROVIDER_SITE_OTHER): Payer: Self-pay | Admitting: Family Medicine

## 2007-08-15 ENCOUNTER — Telehealth: Payer: Self-pay | Admitting: *Deleted

## 2007-08-20 ENCOUNTER — Ambulatory Visit: Payer: Self-pay | Admitting: Family Medicine

## 2007-09-01 ENCOUNTER — Ambulatory Visit: Payer: Self-pay | Admitting: Family Medicine

## 2007-09-04 ENCOUNTER — Encounter: Payer: Self-pay | Admitting: Family Medicine

## 2007-09-04 ENCOUNTER — Telehealth: Payer: Self-pay | Admitting: *Deleted

## 2007-09-10 ENCOUNTER — Encounter: Payer: Self-pay | Admitting: *Deleted

## 2007-09-10 ENCOUNTER — Telehealth: Payer: Self-pay | Admitting: *Deleted

## 2007-09-23 ENCOUNTER — Ambulatory Visit: Payer: Self-pay | Admitting: Family Medicine

## 2007-09-30 ENCOUNTER — Encounter: Payer: Self-pay | Admitting: Family Medicine

## 2007-10-03 ENCOUNTER — Encounter: Payer: Self-pay | Admitting: Family Medicine

## 2007-10-18 ENCOUNTER — Emergency Department (HOSPITAL_COMMUNITY): Admission: EM | Admit: 2007-10-18 | Discharge: 2007-10-18 | Payer: Self-pay | Admitting: Family Medicine

## 2007-10-21 ENCOUNTER — Encounter: Payer: Self-pay | Admitting: Family Medicine

## 2007-11-07 ENCOUNTER — Telehealth: Payer: Self-pay | Admitting: Family Medicine

## 2007-11-17 ENCOUNTER — Ambulatory Visit: Payer: Self-pay | Admitting: Family Medicine

## 2007-11-20 ENCOUNTER — Telehealth: Payer: Self-pay | Admitting: *Deleted

## 2007-11-24 ENCOUNTER — Ambulatory Visit: Payer: Self-pay | Admitting: Family Medicine

## 2007-11-24 ENCOUNTER — Telehealth: Payer: Self-pay | Admitting: *Deleted

## 2007-11-24 ENCOUNTER — Encounter: Payer: Self-pay | Admitting: Family Medicine

## 2007-11-24 LAB — CONVERTED CEMR LAB
Bilirubin Urine: NEGATIVE
Glucose, Urine, Semiquant: NEGATIVE
Protein, U semiquant: NEGATIVE
WBC Urine, dipstick: NEGATIVE

## 2007-12-08 ENCOUNTER — Telehealth: Payer: Self-pay | Admitting: *Deleted

## 2007-12-08 ENCOUNTER — Encounter: Payer: Self-pay | Admitting: *Deleted

## 2008-02-03 ENCOUNTER — Ambulatory Visit: Payer: Self-pay | Admitting: Family Medicine

## 2008-02-03 ENCOUNTER — Telehealth: Payer: Self-pay | Admitting: *Deleted

## 2008-02-06 ENCOUNTER — Encounter: Payer: Self-pay | Admitting: Family Medicine

## 2008-02-06 ENCOUNTER — Telehealth: Payer: Self-pay | Admitting: Family Medicine

## 2008-02-19 ENCOUNTER — Telehealth: Payer: Self-pay | Admitting: *Deleted

## 2008-02-19 ENCOUNTER — Emergency Department (HOSPITAL_COMMUNITY): Admission: EM | Admit: 2008-02-19 | Discharge: 2008-02-19 | Payer: Self-pay | Admitting: Emergency Medicine

## 2008-02-24 ENCOUNTER — Telehealth: Payer: Self-pay | Admitting: *Deleted

## 2008-02-25 ENCOUNTER — Telehealth: Payer: Self-pay | Admitting: *Deleted

## 2008-02-25 ENCOUNTER — Ambulatory Visit: Payer: Self-pay | Admitting: Family Medicine

## 2008-03-19 ENCOUNTER — Ambulatory Visit: Payer: Self-pay | Admitting: Family Medicine

## 2008-03-19 ENCOUNTER — Encounter: Payer: Self-pay | Admitting: Family Medicine

## 2008-03-19 LAB — CONVERTED CEMR LAB: TSH: 1.19 microintl units/mL (ref 0.350–4.50)

## 2008-03-23 ENCOUNTER — Telehealth: Payer: Self-pay | Admitting: Family Medicine

## 2008-03-26 ENCOUNTER — Telehealth: Payer: Self-pay | Admitting: Family Medicine

## 2008-03-26 ENCOUNTER — Ambulatory Visit: Payer: Self-pay | Admitting: Family Medicine

## 2008-04-02 ENCOUNTER — Telehealth: Payer: Self-pay | Admitting: *Deleted

## 2008-04-16 ENCOUNTER — Ambulatory Visit (HOSPITAL_COMMUNITY): Admission: RE | Admit: 2008-04-16 | Discharge: 2008-04-16 | Payer: Self-pay | Admitting: Family Medicine

## 2008-04-16 ENCOUNTER — Ambulatory Visit: Payer: Self-pay | Admitting: Family Medicine

## 2008-04-16 DIAGNOSIS — H6060 Unspecified chronic otitis externa, unspecified ear: Secondary | ICD-10-CM

## 2008-04-20 ENCOUNTER — Encounter: Payer: Self-pay | Admitting: Family Medicine

## 2008-04-27 ENCOUNTER — Ambulatory Visit: Payer: Self-pay | Admitting: Family Medicine

## 2008-04-27 ENCOUNTER — Encounter: Payer: Self-pay | Admitting: Family Medicine

## 2008-04-27 ENCOUNTER — Other Ambulatory Visit: Admission: RE | Admit: 2008-04-27 | Discharge: 2008-04-27 | Payer: Self-pay | Admitting: Family Medicine

## 2008-04-27 LAB — CONVERTED CEMR LAB
ALT: 19 units/L (ref 0–35)
AST: 18 units/L (ref 0–37)
Albumin: 4.3 g/dL (ref 3.5–5.2)
Alkaline Phosphatase: 60 units/L (ref 39–117)
BUN: 13 mg/dL (ref 6–23)
Bilirubin Urine: NEGATIVE
Blood in Urine, dipstick: NEGATIVE
Calcium: 9.6 mg/dL (ref 8.4–10.5)
Chloride: 102 meq/L (ref 96–112)
Glucose, Urine, Semiquant: NEGATIVE
Ketones, urine, test strip: NEGATIVE
Potassium: 4.1 meq/L (ref 3.5–5.3)
Specific Gravity, Urine: 1.025
pH: 5.5

## 2008-04-28 ENCOUNTER — Telehealth: Payer: Self-pay | Admitting: Family Medicine

## 2008-05-19 ENCOUNTER — Encounter: Admission: RE | Admit: 2008-05-19 | Discharge: 2008-05-19 | Payer: Self-pay | Admitting: Family Medicine

## 2008-08-31 ENCOUNTER — Telehealth: Payer: Self-pay | Admitting: Family Medicine

## 2008-09-02 ENCOUNTER — Ambulatory Visit: Payer: Self-pay | Admitting: Family Medicine

## 2008-10-11 ENCOUNTER — Ambulatory Visit: Payer: Self-pay | Admitting: Family Medicine

## 2008-10-11 ENCOUNTER — Encounter: Payer: Self-pay | Admitting: Family Medicine

## 2008-10-12 ENCOUNTER — Encounter: Payer: Self-pay | Admitting: Family Medicine

## 2008-10-12 LAB — CONVERTED CEMR LAB
ALT: 16 units/L (ref 0–35)
AST: 17 units/L (ref 0–37)
Alkaline Phosphatase: 54 units/L (ref 39–117)
BUN: 11 mg/dL (ref 6–23)
Chloride: 100 meq/L (ref 96–112)
Creatinine, Ser: 0.74 mg/dL (ref 0.40–1.20)
HCT: 41.2 % (ref 36.0–46.0)
HCV Ab: NEGATIVE
Hemoglobin: 13.2 g/dL (ref 12.0–15.0)
MCHC: 32 g/dL (ref 30.0–36.0)
Platelets: 360 10*3/uL (ref 150–400)
Potassium: 3.9 meq/L (ref 3.5–5.3)
RDW: 14.1 % (ref 11.5–15.5)

## 2008-10-14 ENCOUNTER — Telehealth: Payer: Self-pay | Admitting: Family Medicine

## 2008-10-14 ENCOUNTER — Encounter: Admission: RE | Admit: 2008-10-14 | Discharge: 2008-10-14 | Payer: Self-pay | Admitting: Family Medicine

## 2008-10-26 ENCOUNTER — Telehealth: Payer: Self-pay | Admitting: *Deleted

## 2008-10-27 ENCOUNTER — Ambulatory Visit: Payer: Self-pay | Admitting: Family Medicine

## 2008-11-12 ENCOUNTER — Ambulatory Visit: Payer: Self-pay | Admitting: Family Medicine

## 2008-11-15 ENCOUNTER — Encounter: Payer: Self-pay | Admitting: Family Medicine

## 2008-11-25 ENCOUNTER — Telehealth: Payer: Self-pay | Admitting: Family Medicine

## 2008-11-30 ENCOUNTER — Ambulatory Visit: Payer: Self-pay | Admitting: Family Medicine

## 2008-11-30 DIAGNOSIS — L280 Lichen simplex chronicus: Secondary | ICD-10-CM

## 2008-12-07 ENCOUNTER — Ambulatory Visit: Payer: Self-pay | Admitting: Family Medicine

## 2009-01-20 ENCOUNTER — Telehealth: Payer: Self-pay | Admitting: Family Medicine

## 2009-01-21 ENCOUNTER — Encounter: Payer: Self-pay | Admitting: Family Medicine

## 2009-03-03 ENCOUNTER — Encounter: Admission: RE | Admit: 2009-03-03 | Discharge: 2009-03-03 | Payer: Self-pay | Admitting: Family Medicine

## 2009-03-03 ENCOUNTER — Telehealth: Payer: Self-pay | Admitting: Family Medicine

## 2009-03-03 ENCOUNTER — Ambulatory Visit: Payer: Self-pay | Admitting: Family Medicine

## 2009-03-07 ENCOUNTER — Telehealth: Payer: Self-pay | Admitting: Family Medicine

## 2009-03-10 ENCOUNTER — Telehealth: Payer: Self-pay | Admitting: Family Medicine

## 2009-03-10 ENCOUNTER — Encounter: Payer: Self-pay | Admitting: Family Medicine

## 2009-04-05 ENCOUNTER — Encounter: Payer: Self-pay | Admitting: Family Medicine

## 2009-04-21 ENCOUNTER — Telehealth: Payer: Self-pay | Admitting: Family Medicine

## 2009-04-27 ENCOUNTER — Telehealth: Payer: Self-pay | Admitting: Family Medicine

## 2009-05-06 ENCOUNTER — Telehealth: Payer: Self-pay | Admitting: Family Medicine

## 2009-05-18 ENCOUNTER — Telehealth: Payer: Self-pay | Admitting: Family Medicine

## 2009-05-20 ENCOUNTER — Encounter: Admission: RE | Admit: 2009-05-20 | Discharge: 2009-05-20 | Payer: Self-pay | Admitting: Family Medicine

## 2009-05-23 ENCOUNTER — Telehealth: Payer: Self-pay | Admitting: *Deleted

## 2009-05-27 ENCOUNTER — Ambulatory Visit: Payer: Self-pay | Admitting: Family Medicine

## 2009-05-30 ENCOUNTER — Encounter: Payer: Self-pay | Admitting: Family Medicine

## 2009-05-30 ENCOUNTER — Ambulatory Visit: Payer: Self-pay | Admitting: Family Medicine

## 2009-05-30 LAB — CONVERTED CEMR LAB
ALT: 18 units/L (ref 0–35)
Albumin: 4.3 g/dL (ref 3.5–5.2)
CO2: 26 meq/L (ref 19–32)
Calcium: 9.8 mg/dL (ref 8.4–10.5)
Chloride: 100 meq/L (ref 96–112)
Cholesterol: 132 mg/dL (ref 0–200)
Glucose, Bld: 103 mg/dL — ABNORMAL HIGH (ref 70–99)
Sodium: 143 meq/L (ref 135–145)
Total Protein: 7.4 g/dL (ref 6.0–8.3)
Triglycerides: 114 mg/dL (ref <150)

## 2009-05-31 ENCOUNTER — Encounter: Payer: Self-pay | Admitting: Family Medicine

## 2009-06-01 ENCOUNTER — Telehealth: Payer: Self-pay | Admitting: Family Medicine

## 2009-06-02 ENCOUNTER — Emergency Department (HOSPITAL_COMMUNITY): Admission: EM | Admit: 2009-06-02 | Discharge: 2009-06-02 | Payer: Self-pay | Admitting: Plastic Surgery

## 2009-06-02 ENCOUNTER — Encounter: Payer: Self-pay | Admitting: Family Medicine

## 2009-06-06 ENCOUNTER — Encounter: Payer: Self-pay | Admitting: Family Medicine

## 2009-06-06 ENCOUNTER — Ambulatory Visit: Payer: Self-pay | Admitting: Family Medicine

## 2009-06-06 ENCOUNTER — Telehealth: Payer: Self-pay | Admitting: *Deleted

## 2009-06-06 LAB — CONVERTED CEMR LAB: Uric Acid, Serum: 9.9 mg/dL — ABNORMAL HIGH (ref 2.4–7.0)

## 2009-06-07 ENCOUNTER — Telehealth: Payer: Self-pay | Admitting: Family Medicine

## 2009-06-07 ENCOUNTER — Encounter: Payer: Self-pay | Admitting: Family Medicine

## 2009-06-07 DIAGNOSIS — M109 Gout, unspecified: Secondary | ICD-10-CM

## 2009-06-08 ENCOUNTER — Telehealth: Payer: Self-pay | Admitting: Family Medicine

## 2009-06-09 ENCOUNTER — Telehealth: Payer: Self-pay | Admitting: Family Medicine

## 2009-06-23 ENCOUNTER — Telehealth: Payer: Self-pay | Admitting: Family Medicine

## 2009-07-06 ENCOUNTER — Telehealth: Payer: Self-pay | Admitting: *Deleted

## 2009-07-08 ENCOUNTER — Encounter: Payer: Self-pay | Admitting: Family Medicine

## 2009-07-08 ENCOUNTER — Ambulatory Visit: Payer: Self-pay | Admitting: Family Medicine

## 2009-07-08 LAB — CONVERTED CEMR LAB: Uric Acid, Serum: 5.9 mg/dL (ref 2.4–7.0)

## 2009-07-11 ENCOUNTER — Telehealth: Payer: Self-pay | Admitting: Family Medicine

## 2009-07-12 ENCOUNTER — Encounter: Payer: Self-pay | Admitting: Family Medicine

## 2009-07-18 ENCOUNTER — Telehealth: Payer: Self-pay | Admitting: Family Medicine

## 2009-07-22 ENCOUNTER — Telehealth: Payer: Self-pay | Admitting: *Deleted

## 2009-07-25 ENCOUNTER — Telehealth: Payer: Self-pay | Admitting: Family Medicine

## 2009-07-26 ENCOUNTER — Encounter: Payer: Self-pay | Admitting: Family Medicine

## 2009-08-03 ENCOUNTER — Telehealth: Payer: Self-pay | Admitting: Family Medicine

## 2009-10-04 ENCOUNTER — Ambulatory Visit: Payer: Self-pay | Admitting: Family Medicine

## 2009-10-04 DIAGNOSIS — N951 Menopausal and female climacteric states: Secondary | ICD-10-CM

## 2009-10-12 ENCOUNTER — Telehealth: Payer: Self-pay | Admitting: Family Medicine

## 2009-10-14 ENCOUNTER — Telehealth: Payer: Self-pay | Admitting: Family Medicine

## 2009-10-19 ENCOUNTER — Telehealth: Payer: Self-pay | Admitting: Family Medicine

## 2009-10-31 ENCOUNTER — Ambulatory Visit: Payer: Self-pay | Admitting: Family Medicine

## 2009-11-21 ENCOUNTER — Telehealth: Payer: Self-pay | Admitting: Family Medicine

## 2009-12-08 ENCOUNTER — Telehealth: Payer: Self-pay | Admitting: Family Medicine

## 2010-01-03 ENCOUNTER — Ambulatory Visit: Payer: Self-pay | Admitting: Family Medicine

## 2010-01-16 ENCOUNTER — Telehealth: Payer: Self-pay | Admitting: *Deleted

## 2010-01-19 ENCOUNTER — Telehealth: Payer: Self-pay | Admitting: Family Medicine

## 2010-01-19 ENCOUNTER — Ambulatory Visit: Payer: Self-pay | Admitting: Family Medicine

## 2010-01-19 DIAGNOSIS — E1165 Type 2 diabetes mellitus with hyperglycemia: Secondary | ICD-10-CM

## 2010-01-19 DIAGNOSIS — L97909 Non-pressure chronic ulcer of unspecified part of unspecified lower leg with unspecified severity: Secondary | ICD-10-CM | POA: Insufficient documentation

## 2010-01-19 LAB — CONVERTED CEMR LAB
Albumin: 4.6 g/dL (ref 3.5–5.2)
BUN: 17 mg/dL (ref 6–23)
CO2: 29 meq/L (ref 19–32)
Calcium: 9.9 mg/dL (ref 8.4–10.5)
Chloride: 98 meq/L (ref 96–112)
Glucose, Bld: 177 mg/dL — ABNORMAL HIGH (ref 70–99)
Hemoglobin: 13.6 g/dL (ref 12.0–15.0)
Hgb A1c MFr Bld: 8.4 %
Potassium: 4.1 meq/L (ref 3.5–5.3)
RBC: 4.8 M/uL (ref 3.87–5.11)
RDW: 14.2 % (ref 11.5–15.5)

## 2010-01-20 ENCOUNTER — Telehealth: Payer: Self-pay | Admitting: Family Medicine

## 2010-01-23 ENCOUNTER — Ambulatory Visit: Payer: Self-pay | Admitting: Family Medicine

## 2010-01-30 ENCOUNTER — Ambulatory Visit: Payer: Self-pay | Admitting: Family Medicine

## 2010-02-07 ENCOUNTER — Encounter: Payer: Self-pay | Admitting: Family Medicine

## 2010-02-21 ENCOUNTER — Encounter: Payer: Self-pay | Admitting: Family Medicine

## 2010-02-21 LAB — CONVERTED CEMR LAB
BUN: 14 mg/dL (ref 6–23)
Chloride: 99 meq/L (ref 96–112)
Glucose, Bld: 165 mg/dL — ABNORMAL HIGH (ref 70–99)
Potassium: 4 meq/L (ref 3.5–5.3)

## 2010-02-22 ENCOUNTER — Encounter: Payer: Self-pay | Admitting: Family Medicine

## 2010-02-22 ENCOUNTER — Ambulatory Visit: Payer: Self-pay | Admitting: Family Medicine

## 2010-02-22 ENCOUNTER — Telehealth: Payer: Self-pay | Admitting: Family Medicine

## 2010-02-23 ENCOUNTER — Telehealth: Payer: Self-pay | Admitting: Family Medicine

## 2010-03-27 ENCOUNTER — Encounter: Payer: Self-pay | Admitting: Family Medicine

## 2010-03-28 ENCOUNTER — Encounter: Payer: Self-pay | Admitting: Family Medicine

## 2010-03-28 ENCOUNTER — Encounter (INDEPENDENT_AMBULATORY_CARE_PROVIDER_SITE_OTHER): Payer: Self-pay | Admitting: *Deleted

## 2010-04-06 NOTE — Progress Notes (Signed)
Summary: phn msg  Phone Note Call from Patient Call back at Home Phone [phone]   Caller: Patient Summary of Call: pt called wanting to change doctors because she wants a younger one but not a resident- doesn't want to hear the doctors problems and that is not why she is coming there. Initial call taken by: De Nurse,  April 27, 2009 2:24 PM  Follow-up for Phone Call        Called NA left message to call back Follow-up by: Pearlean Brownie MD,  May 02, 2009 10:59 AM  Additional Follow-up for Phone Call Additional follow up Details #1::        Called again spoke with patient who sounded very sleepy.  Responded that she had thought about things and is happy to continue with her PCP Additional Follow-up by: Pearlean Brownie MD,  May 03, 2009 5:25 PM

## 2010-04-06 NOTE — Progress Notes (Signed)
Summary: triage  Phone Note Call from Patient   Caller: Patient Summary of Call: Confused about some medicine Jacqueline Reese put her on. Initial call taken by: Jacqueline Reese,  June 08, 2009 12:18 PM  Follow-up for Phone Call        told her what diclofenac was for. states she has no pain now but will take if she starts hurting. told her what allopurinol was & told her to take as written she agreed with plan Follow-up by: Jacqueline Circle RN,  June 08, 2009 12:27 PM  Additional Follow-up for Phone Call Additional follow up Details #1::        allopurinol alone could percipitate a flair, called and explained this to her.  Recommended that she use both for 4 weeks then allopurinol alone to prevent gout. Additional Follow-up by: Jacqueline Murphy NP,  June 08, 2009 1:50 PM

## 2010-04-06 NOTE — Assessment & Plan Note (Signed)
Summary: Jacqueline Reese,tcb   Vital Signs:  Patient profile:   55 year old female Height:      64 inches Weight:      239 pounds BMI:     41.17 Temp:     98.4 degrees F Pulse rate:   64 / minute BP sitting:   134 / 82  (left arm) Cuff size:   large  Vitals Entered By: Tessie Fass CMA (May 27, 2009 9:27 AM) CC: Jacqueline Reese Is Patient Diabetic? No Pain Assessment Patient in pain? no        Primary Care Provider:  Luretha Murphy NP  CC:  Jacqueline Reese.  History of Present Illness: Patient here for Jacqueline Reese without pap, reports no health problems or concerns this visit.  Continues to see mental health provider for management of schizophrenia.    Habits & Providers  Alcohol-Tobacco-Diet     Tobacco Status: quit  Current Medications (verified): 1)  Geodon 80 Mg Caps (Ziprasidone Hcl) .... Take 3 Capsule By Mouth Once A Day 2)  Hydrochlorothiazide 25 Mg  Tabs (Hydrochlorothiazide) .... Take 1 Tab By Mouth Every Morning 3)  Wellbutrin Sr 150 Mg  Tb12 (Bupropion Hcl) 4)  Aspirin Buffered 325 Mg  Tabs (Aspirin Buff(Mgcarb-Alaminoac)) 5)  Clonazepam 1 Mg  Tabs (Clonazepam) .... Two Times A Day 6)  Base D Polyethylene Glycol  Powd (Polyethylene Glycol 4500) .Marland KitchenMarland KitchenMarland Kitchen 17 Gm in 4 Oz Water Two Times A Day, Qs 7)  Anumed-Hc 25 Mg Supp (Hydrocortisone Acetate) .... Insert One As Needed 8)  Metformin Hcl 500 Mg Tabs (Metformin Hcl) .... Two Times A Day 9)  Ibuprofen 800 Mg Tabs (Ibuprofen) .... One Three Times A Day As Needed Pain 10)  Hydroxyzine Hcl 10 Mg Tabs (Hydroxyzine Hcl) .... Three Times A Day As Needed For Itch 11)  Hydrocortisone 2.5 % Oint (Hydrocortisone) .... Apply To Rash Bid , 80 Gm Tube 12)  Omeprazole 20 Mg Cpdr (Omeprazole) .... One Daily  Allergies (verified): 1)  ! Prednisone 2)  ! Doxycycline 3)  ! * Lamisil  Past History:  Past Medical History: Reviewed history from 03/10/2009 and no changes required. ? Syphillis exposure 8/96,  idiopathic angioedema  [facility] [phone],    radiocarpal joint space loss 6/98,  Reacurring vaginal candidiasis-Boric Acid Sup.,  secondary amenorrhea resolved 3/94     Recurrent chest wall pain from large breasts Cervical spine film: Evidence of chronic C5-C6 and C6-C7 disc degeneration. 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 Right Hip film: Mild to moderate degenerative changes at the right hip for age. No acute osseous abnormality identified.  Past Surgical History: Reviewed history from 08/20/2007 and no changes required. 3 hour GTT-no indiacative of DM - 01/29/2005,  Appendectomy 1977  c/s 1/97   ganglionectomy lt. Wrist 1977  Tubal ligation 1992  UGI Series 11/96 wnl       Family History: Reviewed history from 05/02/2006 and no changes required. asthma, diabetes, HTN  Social History: Reviewed history from 04/27/2008 and no changes required. tob-QUIT, no etoh, no drugs  Physical Exam  General:  Well-developed,well-nourished,in no acute distress; alert,appropriate and cooperative throughout examination Head:  Normocephalic and atraumatic without obvious abnormalities. No apparent alopecia or balding. Eyes:  No corneal or conjunctival inflammation noted. EOMI. Perrla. Vision grossly normal. Ears:  External ear exam shows no significant lesions or deformities.  Otoscopic examination reveals clear canals, tympanic membranes are intact bilaterally without bulging, retraction, inflammation or discharge. Hearing is grossly normal bilaterally. Nose:  External nasal examination  shows no deformity or inflammation. Nasal mucosa are pink and moist without lesions or exudates. Mouth:  Oral mucosa and oropharynx without lesions or exudates.  Teeth in good repair. Neck:  No deformities, masses, or tenderness noted. Chest Wall:  No deformities, masses, or tenderness noted. Lungs:  Normal respiratory effort, chest expands symmetrically. Lungs are clear to auscultation, no crackles or wheezes. Heart:  Normal rate  and regular rhythm. S1 and S2 normal without gallop, murmur, click, rub or other extra sounds. Abdomen:  Bowel sounds positive,abdomen soft and non-tender without masses, organomegaly or hernias noted. Msk:  No deformity or scoliosis noted of thoracic or lumbar spine.   Pulses:  R and L carotid,radial,femoral,dorsalis pedis and posterior tibial pulses are full and equal bilaterally Extremities:  No clubbing, cyanosis, edema, or deformity noted with normal full range of motion of all joints.   Neurologic:  No cranial nerve deficits noted. Station and gait are normal. Plantar reflexes are down-going bilaterally. DTRs are symmetrical throughout. Sensory, motor and coordinative functions appear intact. Skin:  Intact without suspicious lesions or rashes Cervical Nodes:  No lymphadenopathy noted Psych:  Cognition and judgment appear intact. Alert and cooperative, flat affect.  No apparent delusions, illusions, hallucinations   Impression & Recommendations:  Problem # 1:  HEALTH MAINTENANCE EXAM (ICD-V70.0)  For physical exam today, no concerns or complaints, to return to office fasting for labs and appointment in the next couple of months for Pap smear.  Orders: [facility] - Est  40-64 yrs (54098)  Problem # 2:  SCREENING OTHER&UNSPEC CARDIOVASCULAR CONDITIONS (ICD-V81.2) Fasting labs next week Future Orders: Lipid-[facility] ([id number]) ... 05/05/2010  Problem # 3:  HYPERTENSION, BENIGN SYSTEMIC (ICD-401.1) Well controlled Her updated medication list for this problem includes:    Hydrochlorothiazide 25 Mg Tabs (Hydrochlorothiazide) .Marland Kitchen... Take 1 tab by mouth every morning  Future Orders: Comp Met-[facility] ([id number]) ... 05/05/2010  Problem # 4:  OBESITY, NOS (ICD-278.00) One of her biggest struggles, she likes to eat, keeps food out on the table at all times.  Family snacks all day, they do not sit down and eat meals.  Complete Medication List: 1)  Geodon 80 Mg Caps (Ziprasidone hcl) .... Take 3  capsule by mouth once a day 2)  Hydrochlorothiazide 25 Mg Tabs (Hydrochlorothiazide) .... Take 1 tab by mouth every morning 3)  Wellbutrin Sr 150 Mg Tb12 (Bupropion hcl) 4)  Aspirin Buffered 325 Mg Tabs (Aspirin buff(mgcarb-alaminoac)) 5)  Clonazepam 1 Mg Tabs (Clonazepam) .... Two times a day 6)  Base D Polyethylene Glycol Powd (Polyethylene glycol 4500) .Marland KitchenMarland Kitchen. 17 gm in 4 oz water two times a day, qs 7)  Anumed-hc 25 Mg Supp (Hydrocortisone acetate) .... Insert one as needed 8)  Metformin Hcl 500 Mg Tabs (Metformin hcl) .... Two times a day 9)  Ibuprofen 800 Mg Tabs (Ibuprofen) .... One three times a day as needed pain 10)  Hydroxyzine Hcl 10 Mg Tabs (Hydroxyzine hcl) .... Three times a day as needed for itch 11)  Hydrocortisone 2.5 % Oint (Hydrocortisone) .... Apply to rash bid , 80 gm tube 12)  Omeprazole 20 Mg Cpdr (Omeprazole) .... One daily  Other Orders: Future Orders: TSH-[facility] ([id number]) ... 05/05/2010  Patient Instructions: 1)  Return Monday AM for fasting labs 2)  Schedule appt for Pap testing in 3 year intervals.   Prevention & Chronic Care Immunizations   Influenza vaccine: Fluvax MCR  (12/07/2008)   Influenza vaccine due: 11/23/2008    Tetanus booster: 01/03/2001: Done.   Tetanus booster  due: 01/04/2011    Pneumococcal vaccine: Not documented  Colorectal Screening   Hemoccult: abnormal  (09/04/2007)   Hemoccult action/deferral: Not indicated  (04/05/2009)   Hemoccult due: 09/03/2008    Colonoscopy: abnormal  (09/30/2007)   Colonoscopy due: 09/29/2012  Other Screening   Pap smear: NEGATIVE FOR INTRAEPITHELIAL LESIONS OR MALIGNANCY.  (04/27/2008)   Pap smear action/deferral: Deferred-3 yr interval  (04/05/2009)   Pap smear due: 04/30/2008    Mammogram: ASSESSMENT: Negative - BI-RADS 1^MM DIGITAL SCREENING  (05/20/2009)   Mammogram due: 05/18/2008   Smoking status: quit  (05/27/2009)  Lipids   Total Cholesterol: Not documented   LDL: 87  (04/01/2007)    LDL Direct: 83  (04/01/2007)   HDL: Not documented   Triglycerides: Not documented    SGOT (AST): 17  (10/11/2008)   SGPT (ALT): 16  (10/11/2008) CMP ordered    Alkaline phosphatase: 54  (10/11/2008)   Total bilirubin: 0.3  (10/11/2008)    Lipid flowsheet reviewed?: Yes   Progress toward LDL goal: At goal  Hypertension   Last Blood Pressure: 134 / 82  (05/27/2009)   Serum creatinine: 0.74  (10/11/2008)   Serum potassium 3.9  (10/11/2008) CMP ordered     Hypertension flowsheet reviewed?: Yes   Progress toward BP goal: At goal  Self-Management Support :   Personal Goals (by the next clinic visit) :      Personal blood pressure goal: 130/80  (11/30/2008)     Personal LDL goal: 130  (11/30/2008)    Hypertension self-management support: Not documented    Lipid self-management support: Not documented

## 2010-04-06 NOTE — Progress Notes (Signed)
Summary: start glipizide  Phone Note Call from Patient Call back at Home Phone [phone]   Caller: Patient Summary of Call: pt returning call Initial call taken by: De Nurse,  January 20, 2010 1:33 PM  Follow-up for Phone Call        I informed her that her sugars are high and I am sending in an additional medicine. Using Glipizide since her transaminases are slightly elevated.  Follow-up by: Zachery Dauer MD,  January 20, 2010 1:53 PM    New/Updated Medications: GLIPIZIDE 2.5 MG XR24H-TAB (GLIPIZIDE) Take one tab once daily Prescriptions: GLIPIZIDE 2.5 MG XR24H-TAB (GLIPIZIDE) Take one tab once daily  #30 x 3   Entered and Authorized by:   Zachery Dauer MD   Signed by:   Zachery Dauer MD on 01/20/2010   Method used:   Electronically to        [facility] [address] (retail)       [address]       [place], [place]  [id number]       Ph: [phone]       Fax: [phone]   RxID:   [id number]

## 2010-04-06 NOTE — Assessment & Plan Note (Signed)
Summary: F/U  KH   Vital Signs:  Patient profile:   55 year old female Height:      64 inches Weight:      248 pounds BMI:     42.72 Pulse rate:   73 / minute BP sitting:   133 / 82  (left arm) Cuff size:   large  Vitals Entered By: Tessie Fass CMA (February 21, 2010 8:30 AM) CC: F/U   Primary Care Provider:  Luretha Murphy NP  CC:  F/U.  History of Present Illness: Follow up for newly diagnosed diabetes with A1C at 8.4% at last visit, had been treateing glucose intolerace likely related to many factors including psych meds.  She is feeling better in general, she is afraid of diabetes.  She feels that she does not have the money to eat healthy.  Last night she had Ban[employer] Chicken, pees and mashed potatoes, she does not cook fresh food.  She is afraid to walk outside because of [place].  She brought in all of her meds today and her psych meds were updated.  Current Medications (verified): 1)  Geodon 80 Mg Caps (Ziprasidone Hcl) .... Take 3 Capsule By Mouth Once A Day 2)  Hydrochlorothiazide 25 Mg  Tabs (Hydrochlorothiazide) .... Take 1 Tab By Mouth Every Morning 3)  Wellbutrin Sr 150 Mg  Tb12 (Bupropion Hcl) 4)  Aspir-Low 81 Mg Tbec (Aspirin) .... One Daily 5)  Clonazepam 1 Mg  Tabs (Clonazepam) .... Two Times A Day 6)  Base D Polyethylene Glycol  Powd (Polyethylene Glycol 4500) .Marland KitchenMarland KitchenMarland Kitchen 17 Gm in 4 Oz Water Two Times A Day, Qs 7)  Metformin Hcl 500 Mg Tabs (Metformin Hcl) .... Two Times A Day 8)  Hydrocortisone 2.5 % Oint (Hydrocortisone) .... Apply To Rash Bid , 80 Gm Tube 9)  Omeprazole 20 Mg Cpdr (Omeprazole) .... One Daily 10)  Allopurinol 100 Mg Tabs (Allopurinol) .... One Tab Daily, in 2 Weeks Increase To 2 Tabs Daily 11)  Tramadol Hcl 50 Mg Tabs (Tramadol Hcl) .... One Three Times A Day As Needed Pain 12)  Diclofenac Sodium 75 Mg Tbec (Diclofenac Sodium) .... One Two Times A Day As Needed Pain 13)  Glipizide 2.5 Mg Xr24h-Tab (Glipizide) .... Take One Tab  Once Daily 14)  Haloperidol 2 Mg Tabs (Haloperidol) .... One At Bedtime Per Psych 15)  Lexapro 20 Mg Tabs (Escitalopram Oxalate) .... Per Psych  Allergies: 1)  ! Prednisone 2)  ! Doxycycline 3)  ! * Lamisil  Physical Exam  General:  Well-developed,well-nourished,in no acute distress; alert,appropriate and cooperative throughout examination Lungs:  normal respiratory effort and normal breath sounds.   Heart:  normal rate.     Impression & Recommendations:  Problem # 1:  DIAB W/O MENTION COMP TYPE II/UNS TYPE UNCNTRL (ICD-250.02) Will check labs today, at next visit will increase metformin, she is resistent to any big changes in a row.  She will need referral to Diabetes Management but not sure she will go so will start with pharmacy group and then Dr. Gerilyn Pilgrim to help with diet. Her updated medication list for this problem includes:    Aspir-low 81 Mg Tbec (Aspirin) ..... One daily    Metformin Hcl 500 Mg Tabs (Metformin hcl) .Marland Kitchen..Marland Kitchen Two times a day    Glipizide 2.5 Mg Xr24h-tab (Glipizide) .Marland Kitchen... Take one tab once daily  Orders: UA Microalbumin-[facility] ([address]109) Basic Met-[facility] ([id number]) [facility]- Est Level  3 (91478)  Problem # 2:  FOOT ULCER, RIGHT (ICD-707.10)  resolved  Problem # 3:  SCHIZOPHRENIA (ICD-295.90) med list updated, followed by psychiatriy, compliant with meds  Problem # 4:  HYPERTENSION, BENIGN SYSTEMIC (ICD-401.1) plan to switch to ACE/thiazide combo Her updated medication list for this problem includes:    Hydrochlorothiazide 25 Mg Tabs (Hydrochlorothiazide) .Marland Kitchen... Take 1 tab by mouth every morning  Orders: [facility]- Est Level  3 (99213)  Complete Medication List: 1)  Geodon 80 Mg Caps (Ziprasidone hcl) .... Take 3 capsule by mouth once a day 2)  Hydrochlorothiazide 25 Mg Tabs (Hydrochlorothiazide) .... Take 1 tab by mouth every morning 3)  Wellbutrin Sr 150 Mg Tb12 (Bupropion hcl) 4)  Aspir-low 81 Mg Tbec (Aspirin) .... One daily 5)  Clonazepam 1 Mg Tabs (Clonazepam)  .... Two times a day 6)  Base D Polyethylene Glycol Powd (Polyethylene glycol 4500) .Marland KitchenMarland Kitchen. 17 gm in 4 oz water two times a day, qs 7)  Metformin Hcl 500 Mg Tabs (Metformin hcl) .... Two times a day 8)  Hydrocortisone 2.5 % Oint (Hydrocortisone) .... Apply to rash bid , 80 gm tube 9)  Omeprazole 20 Mg Cpdr (Omeprazole) .... One daily 10)  Allopurinol 100 Mg Tabs (Allopurinol) .... One tab daily, in 2 weeks increase to 2 tabs daily 11)  Tramadol Hcl 50 Mg Tabs (Tramadol hcl) .... One three times a day as needed pain 12)  Diclofenac Sodium 75 Mg Tbec (Diclofenac sodium) .... One two times a day as needed pain 13)  Glipizide 2.5 Mg Xr24h-tab (Glipizide) .... Take one tab once daily 14)  Haloperidol 2 Mg Tabs (Haloperidol) .... One at bedtime per psych 15)  Lexapro 20 Mg Tabs (Escitalopram oxalate) .... Per psych  Patient Instructions: 1)  Please schedule a follow-up appointment in 2 months.  2)  I will call with your labs    Orders Added: 1)  UA Microalbumin-[facility] [[id number]] 2)  Basic Met-[facility] [[id number]] 3)  [facility]- Est Level  3 [71696]     Prevention & Chronic Care Immunizations   Influenza vaccine: Historical  (01/03/2010)   Influenza vaccine due: 11/23/2008    Tetanus booster: 01/03/2001: Done.   Tetanus booster due: 01/04/2011    Pneumococcal vaccine: Not documented  Colorectal Screening   Hemoccult: abnormal  (09/04/2007)   Hemoccult action/deferral: Not indicated  (04/05/2009)   Hemoccult due: 09/03/2008    Colonoscopy: abnormal  (09/30/2007)   Colonoscopy due: 09/29/2012  Other Screening   Pap smear: NEGATIVE FOR INTRAEPITHELIAL LESIONS OR MALIGNANCY.  (04/27/2008)   Pap smear action/deferral: Deferred-3 yr interval  (04/05/2009)   Pap smear due: 04/06/2011    Mammogram: ASSESSMENT: Negative - BI-RADS 1^MM DIGITAL SCREENING  (05/20/2009)   Mammogram due: 05/18/2008   Smoking status: quit  (01/30/2010)  Diabetes Mellitus   HgbA1C: 8.4  (01/19/2010)    Eye exam: Not  documented    Foot exam: yes  (01/19/2010)   High risk foot: Not documented   Foot care education: Not documented    Urine microalbumin/creatinine ratio: Not documented   Urine microalbumin action/deferral: Ordered    Diabetes flowsheet reviewed?: Yes   Progress toward A1C goal: Unchanged  Lipids   Total Cholesterol: 132  (05/30/2009)   LDL: 66  (05/30/2009)   LDL Direct: 83  (04/01/2007)   HDL: 43  (05/30/2009)   Triglycerides: 114  (05/30/2009)  Hypertension   Last Blood Pressure: 133 / 82  (02/21/2010)   Serum creatinine: 0.72  (01/19/2010)   Serum potassium 4.1  (01/19/2010)  Self-Management Support :   Personal Goals (by the  next clinic visit) :      Personal blood pressure goal: 130/80  (11/30/2008)     Personal LDL goal: 130  (11/30/2008)    Diabetes self-management support: Not documented    Hypertension self-management support: Written self-care plan  (07/08/2009)  Appended Document: MALBU report    Lab Visit  Laboratory Results   Urine Tests  Date/Time Received: February 21, 2010 8:56 AM  Date/Time Reported: February 21, 2010 12:22 PM   Microalbumin (urine): 10 mg/L Creatinine: 100mg /dL  A:C Ratio <16 Normal Comments: ...............test performed by......Marland KitchenBonnie A. [place], MLS (ASCP)cm    Orders Today:

## 2010-04-06 NOTE — Miscellaneous (Signed)
  Clinical Lists Changes  Medications: Added new medication of TRAMADOL HCL 50 MG TABS (TRAMADOL HCL) one three times a day as needed pain - Signed Rx of TRAMADOL HCL 50 MG TABS (TRAMADOL HCL) one three times a day as needed pain;  #90 x 1;  Signed;  Entered by: Luretha Murphy NP;  Authorized by: Luretha Murphy NP;  Method used: Electronically to [facility] [address], [place], [place]  [id number], Ph: [phone], Fax: [phone]    Prescriptions: TRAMADOL HCL 50 MG TABS (TRAMADOL HCL) one three times a day as needed pain  #90 x 1   Entered and Authorized by:   Luretha Murphy NP   Signed by:   Luretha Murphy NP on 07/26/2009   Method used:   Electronically to        [facility] (retail)       [address]       [place], [place]  [id number]       Ph: [phone]       Fax: [phone]   RxID:   [id number]

## 2010-04-06 NOTE — Assessment & Plan Note (Signed)
Summary: f/up,tcb   Vital Signs:  Patient profile:   55 year old female Weight:      236.4 pounds Pulse rate:   60 / minute BP sitting:   130 / 80  (left arm) Cuff size:   large  Vitals Entered By: [name], (Jul 08, 2009 8:36 AM) CC: labs per s.Wilder Amodei Is Patient Diabetic? No Pain Assessment Patient in pain? no        Primary Care Provider:  Luretha Murphy NP  CC:  labs per s.Houa Ackert.  History of Present Illness: Here for follow up of gout, feels better on the NSAID.  Has not had any bouts of severe pain in her feet.  Starting to miss periods, discussed menopause.  Habits & Providers  Alcohol-Tobacco-Diet     Tobacco Status: quit  Current Medications (verified): 1)  Geodon 80 Mg Caps (Ziprasidone Hcl) .... Take 3 Capsule By Mouth Once A Day 2)  Hydrochlorothiazide 25 Mg  Tabs (Hydrochlorothiazide) .... Take 1/2 Tab By Mouth Every Morning 3)  Wellbutrin Sr 150 Mg  Tb12 (Bupropion Hcl) 4)  Aspirin Buffered 325 Mg  Tabs (Aspirin Buff(Mgcarb-Alaminoac)) 5)  Clonazepam 1 Mg  Tabs (Clonazepam) .... Two Times A Day 6)  Base D Polyethylene Glycol  Powd (Polyethylene Glycol 4500) .Marland KitchenMarland KitchenMarland Kitchen 17 Gm in 4 Oz Water Two Times A Day, Qs 7)  Anumed-Hc 25 Mg Supp (Hydrocortisone Acetate) .... Insert One As Needed 8)  Metformin Hcl 500 Mg Tabs (Metformin Hcl) .... Two Times A Day 9)  Hydroxyzine Hcl 10 Mg Tabs (Hydroxyzine Hcl) .... Three Times A Day As Needed For Itch 10)  Hydrocortisone 2.5 % Oint (Hydrocortisone) .... Apply To Rash Bid , 80 Gm Tube 11)  Omeprazole 20 Mg Cpdr (Omeprazole) .... One Daily 12)  Diclofenac Sodium 75 Mg Tbec (Diclofenac Sodium) .... One Tab Two Times A Day 13)  Allopurinol 100 Mg Tabs (Allopurinol) .... One Tab Daily, in 2 Weeks Increase To 2 Tabs Daily  Allergies (verified): 1)  ! Prednisone 2)  ! Doxycycline 3)  ! * Lamisil  Physical Exam  General:  Usual appearing Lungs:  normal respiratory effort and normal breath sounds.   Heart:  normal rate,  regular rhythm, and no murmur.   Msk:  Normal ROM of all joints, no deformities, or tenderness.     Impression & Recommendations:  Problem # 1:  GOUT, UNSPECIFIED (ICD-274.9)  Her updated medication list for this problem includes:    Diclofenac Sodium 75 Mg Tbec (Diclofenac sodium) ..... One tab two times a day    Allopurinol 100 Mg Tabs (Allopurinol) ..... One tab daily, in 2 weeks increase to 2 tabs daily  Orders: Uric Acid-[facility] ([id number])  Problem # 2:  CONSTIPATION (ICD-564.00) resolved o PEG  Problem # 3:  HYPERTENSION, BENIGN SYSTEMIC (ICD-401.1) at goal Her updated medication list for this problem includes:    Hydrochlorothiazide 25 Mg Tabs (Hydrochlorothiazide) .Marland Kitchen... Take 1/2 tab by mouth every morning  Complete Medication List: 1)  Geodon 80 Mg Caps (Ziprasidone hcl) .... Take 3 capsule by mouth once a day 2)  Hydrochlorothiazide 25 Mg Tabs (Hydrochlorothiazide) .... Take 1/2 tab by mouth every morning 3)  Wellbutrin Sr 150 Mg Tb12 (Bupropion hcl) 4)  Aspirin Buffered 325 Mg Tabs (Aspirin buff(mgcarb-alaminoac)) 5)  Clonazepam 1 Mg Tabs (Clonazepam) .... Two times a day 6)  Base D Polyethylene Glycol Powd (Polyethylene glycol 4500) .Marland KitchenMarland Kitchen. 17 gm in 4 oz water two times a day, qs 7)  Anumed-hc 25  Mg Supp (Hydrocortisone acetate) .... Insert one as needed 8)  Metformin Hcl 500 Mg Tabs (Metformin hcl) .... Two times a day 9)  Hydroxyzine Hcl 10 Mg Tabs (Hydroxyzine hcl) .... Three times a day as needed for itch 10)  Hydrocortisone 2.5 % Oint (Hydrocortisone) .... Apply to rash bid , 80 gm tube 11)  Omeprazole 20 Mg Cpdr (Omeprazole) .... One daily 12)  Diclofenac Sodium 75 Mg Tbec (Diclofenac sodium) .... One tab two times a day 13)  Allopurinol 100 Mg Tabs (Allopurinol) .... One tab daily, in 2 weeks increase to 2 tabs daily  Patient Instructions: 1)  Please schedule a follow-up appointment in 2 months.    Prevention & Chronic Care Immunizations   Influenza vaccine:  Fluvax MCR  (12/07/2008)   Influenza vaccine due: 11/23/2008    Tetanus booster: 01/03/2001: Done.   Tetanus booster due: 01/04/2011    Pneumococcal vaccine: Not documented  Colorectal Screening   Hemoccult: abnormal  (09/04/2007)   Hemoccult action/deferral: Not indicated  (04/05/2009)   Hemoccult due: 09/03/2008    Colonoscopy: abnormal  (09/30/2007)   Colonoscopy due: 09/29/2012  Other Screening   Pap smear: NEGATIVE FOR INTRAEPITHELIAL LESIONS OR MALIGNANCY.  (04/27/2008)   Pap smear action/deferral: Deferred-3 yr interval  (04/05/2009)   Pap smear due: 04/06/2011    Mammogram: ASSESSMENT: Negative - BI-RADS 1^MM DIGITAL SCREENING  (05/20/2009)   Mammogram due: 05/18/2008   Smoking status: quit  (07/08/2009)  Lipids   Total Cholesterol: 132  (05/30/2009)   LDL: 66  (05/30/2009)   LDL Direct: 83  (04/01/2007)   HDL: 43  (05/30/2009)   Triglycerides: 114  (05/30/2009)  Hypertension   Last Blood Pressure: 130 / 80  (07/08/2009)   Serum creatinine: 0.80  (05/30/2009)   Serum potassium 4.2  (05/30/2009)    Hypertension flowsheet reviewed?: Yes   Progress toward BP goal: At goal  Self-Management Support :   Personal Goals (by the next clinic visit) :      Personal blood pressure goal: 130/80  (11/30/2008)   Patient will work on the following items until the next clinic visit to reach self-care goals:     Eating: drink diet soda or water instead of juice or soda, eat more vegetables, use fresh or frozen vegetables, eat foods that are low in salt, eat baked foods instead of fried foods, eat fruit for snacks and desserts  (07/08/2009)    Hypertension self-management support: Written self-care plan  (07/08/2009)   Hypertension self-care plan printed.

## 2010-04-06 NOTE — Progress Notes (Signed)
Summary: pt needs OV/TS  Phone Note Call from Patient Call back at Home Phone [phone]   Caller: Patient Summary of Call: has a swollen toe and it's "leaking" - wants to know what to do Initial call taken by: De Nurse,  January 16, 2010 3:22 PM  Follow-up for Phone Call        called pt and advised to sched. OV to have toe examined. pt agreed. (pt's speech was slurry) Follow-up by: [name],,  January 16, 2010 4:23 PM

## 2010-04-06 NOTE — Miscellaneous (Signed)
Summary: changing practices  Clinical Lists Changes  rec'd med rec request from [facility], Dr Ventura Bruns  March 28, 2010 4:38 PM

## 2010-04-06 NOTE — Progress Notes (Signed)
Summary: Lab Wk  Phone Note Call from Patient   Caller: Patient Summary of Call: Pt had blood work this a.m. wants to know if it can be checked to see if she has gout? Initial call taken by: [name],  June 06, 2009 11:23 AM  Follow-up for Phone Call        called pt and explained that she had uric acid checked today. pt verbalized understanding. Follow-up by: [name],,  June 06, 2009 11:55 AM

## 2010-04-06 NOTE — Progress Notes (Signed)
Summary: test results  Phone Note Call from Patient Call back at Home Phone [phone]   Caller: Patient Summary of Call: wants to know results of test Initial call taken by: De Nurse,  Jul 11, 2009 11:57 AM  Follow-up for Phone Call        told her uric acid levels were ok. wanted to know if she should still take the gout med. told her I will send that to S. Fleta Borgeson & will call her with the answer Follow-up by: Golden Circle RN,  Jul 11, 2009 12:09 PM  Additional Follow-up for Phone Call Additional follow up Details #1::        called, must stay on allopurinol to keep uric acid below 6, she understood Additional Follow-up by: Luretha Murphy NP,  Jul 11, 2009 12:13 PM

## 2010-04-06 NOTE — Miscellaneous (Signed)
Summary: call from pt  Clinical Lists Changes Pt want to know about her glucose level.  Want to know if there was an improvement showing since she has been taking her metformin.  Would like for you to call her at [phone] after 12:00 pm Abundio Miu  June 02, 2009 12:12 PM    called Luretha Murphy NP  June 02, 2009 1:30 PM

## 2010-04-06 NOTE — Assessment & Plan Note (Signed)
Summary: hot flashes,df   Vital Signs:  Patient profile:   55 year old female Height:      64 inches Weight:      251.3 pounds BMI:     43.29 Pulse rate:   74 / minute BP sitting:   144 / 89  (right arm)  Vitals Entered By: [name], (October 04, 2009 9:36 AM) CC: c/o hot flashes. during night is worse. Is Patient Diabetic? No Pain Assessment Patient in pain? no        Primary Care Provider:  Luretha Murphy NP  CC:  c/o hot flashes. during night is worse.Marland Kitchen  History of Present Illness: Severe hot flashes, having to change sheets during the night.  Hair is soaked.  She is miserable.  Had menses in May, but they have been very sporatic.  Would like to try estrogen replacement.  Discussed increased risk reported by studies of breast cancer.  She stays current with mammogram and does not have + family history. No personal history of deep vein thrombosis.  Does not like the fact that she will be going on another medicaion.  She feels that she is already on plenty.  Most meds for psychosis and prescribed by psychiatrist.  Habits & Providers  Alcohol-Tobacco-Diet     Tobacco Status: quit  Current Medications (verified): 1)  Geodon 80 Mg Caps (Ziprasidone Hcl) .... Take 3 Capsule By Mouth Once A Day 2)  Hydrochlorothiazide 25 Mg  Tabs (Hydrochlorothiazide) .... Take 1/2 Tab By Mouth Every Morning 3)  Wellbutrin Sr 150 Mg  Tb12 (Bupropion Hcl) 4)  Aspir-Low 81 Mg Tbec (Aspirin) .... One Daily 5)  Clonazepam 1 Mg  Tabs (Clonazepam) .... Two Times A Day 6)  Base D Polyethylene Glycol  Powd (Polyethylene Glycol 4500) .Marland KitchenMarland KitchenMarland Kitchen 17 Gm in 4 Oz Water Two Times A Day, Qs 7)  Anumed-Hc 25 Mg Supp (Hydrocortisone Acetate) .... Insert One As Needed 8)  Metformin Hcl 500 Mg Tabs (Metformin Hcl) .... Two Times A Day 9)  Hydroxyzine Hcl 10 Mg Tabs (Hydroxyzine Hcl) .... Three Times A Day As Needed For Itch 10)  Hydrocortisone 2.5 % Oint (Hydrocortisone) .... Apply To Rash Bid , 80 Gm Tube 11)   Omeprazole 20 Mg Cpdr (Omeprazole) .... One Daily 12)  Allopurinol 100 Mg Tabs (Allopurinol) .... One Tab Daily, in 2 Weeks Increase To 2 Tabs Daily 13)  Tramadol Hcl 50 Mg Tabs (Tramadol Hcl) .... One Three Times A Day As Needed Pain 14)  Estradiol 1 Mg Tabs (Estradiol) .... One Daily 15)  Provera 2.5 Mg Tabs (Medroxyprogesterone Acetate) .... One Daily  Allergies: 1)  ! Prednisone 2)  ! Doxycycline 3)  ! 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  Estradiol 1 mg and Provera 2.5 mg daily Her updated medication list for this problem includes:    Estradiol 1 Mg Tabs (Estradiol) ..... One daily  Orders: [facility]- Est Level  3 (99213)  Complete Medication List: 1)  Geodon 80 Mg Caps (Ziprasidone hcl) .... Take 3 capsule by mouth once a day 2)  Hydrochlorothiazide 25 Mg Tabs (Hydrochlorothiazide) .... Take 1/2 tab by mouth every morning 3)  Wellbutrin Sr 150 Mg Tb12 (Bupropion hcl) 4)  Aspir-low 81 Mg Tbec (Aspirin) .... One daily 5)  Clonazepam 1 Mg  Tabs (Clonazepam) .... Two times a day 6)  Base D Polyethylene Glycol Powd (Polyethylene glycol 4500) .Marland KitchenMarland Kitchen. 17 gm in 4 oz water two times a day, qs 7)  Anumed-hc 25 Mg Supp (Hydrocortisone acetate) .... Insert one as needed 8)  Metformin Hcl 500 Mg Tabs (Metformin hcl) .... Two times a day 9)  Hydroxyzine Hcl 10 Mg Tabs (Hydroxyzine hcl) .... Three times a day as needed for itch 10)  Hydrocortisone 2.5 % Oint (Hydrocortisone) .... Apply to rash bid , 80 gm tube 11)  Omeprazole 20 Mg Cpdr (Omeprazole) .... One daily 12)  Allopurinol 100 Mg Tabs (Allopurinol) .... One tab daily, in 2 weeks increase to 2 tabs daily 13)  Tramadol Hcl 50 Mg Tabs (Tramadol hcl) .... One three times a day as needed pain 14)  Estradiol 1 Mg Tabs (Estradiol)  .... One daily 15)  Provera 2.5 Mg Tabs (Medroxyprogesterone acetate) .... One daily  Patient Instructions: 1)  Please schedule a follow-up appointment in 1 month.  Prescriptions: PROVERA 2.5 MG TABS (MEDROXYPROGESTERONE ACETATE) one daily  #30 x 2   Entered and Authorized by:   Luretha Murphy NP   Signed by:   Luretha Murphy NP on 10/04/2009   Method used:   Electronically to        [facility] (retail)       [address]       [place], [place]  [id number]       Ph: [phone]       Fax: [phone]   RxID:   [id number] ESTRADIOL 1 MG TABS (ESTRADIOL) one daily  #30 x 2   Entered and Authorized by:   Luretha Murphy NP   Signed by:   Luretha Murphy NP on 10/04/2009   Method used:   Electronically to        [facility] [address] (retail)       [address]       [place], [place]  [id number]       Ph: [phone]       Fax: [phone]   RxID:   [id number]    Prevention & Chronic Care Immunizations   Influenza vaccine: Fluvax MCR  (12/07/2008)   Influenza vaccine due: 11/23/2008    Tetanus booster: 01/03/2001: Done.   Tetanus booster due: 01/04/2011    Pneumococcal vaccine: Not documented  Colorectal Screening   Hemoccult: abnormal  (09/04/2007)   Hemoccult action/deferral: Not indicated  (04/05/2009)   Hemoccult due: 09/03/2008    Colonoscopy: abnormal  (09/30/2007)   Colonoscopy due: 09/29/2012  Other Screening   Pap smear: NEGATIVE FOR INTRAEPITHELIAL LESIONS OR MALIGNANCY.  (04/27/2008)   Pap smear action/deferral: Deferred-3 yr interval  (04/05/2009)   Pap smear due: 04/06/2011    Mammogram: ASSESSMENT: Negative - BI-RADS 1^MM DIGITAL SCREENING  (05/20/2009)   Mammogram due: 05/18/2008   Smoking status: quit  (10/04/2009)  Lipids   Total Cholesterol: 132  (05/30/2009)   LDL: 66  (05/30/2009)   LDL Direct: 83  (04/01/2007)   HDL: 43  (05/30/2009)   Triglycerides: 114  (05/30/2009)  Hypertension   Last Blood  Pressure: 144 / 89  (10/04/2009)   Serum creatinine: 0.80  (05/30/2009)   Serum potassium 4.2  (05/30/2009)  Self-Management Support :   Personal Goals (by the next clinic visit) :      Personal blood pressure goal: 130/80  (11/30/2008)   Hypertension self-management support: Written self-care plan  (07/08/2009)

## 2010-04-06 NOTE — Miscellaneous (Signed)
  Clinical Lists Changes  Medications: Added new medication of TRAMADOL HCL 50 MG TABS (TRAMADOL HCL) 1-2 three times a day as needed pain - Signed Rx of TRAMADOL HCL 50 MG TABS (TRAMADOL HCL) 1-2 three times a day as needed pain;  #50 x 3;  Signed;  Entered by: Luretha Murphy NP;  Authorized by: Luretha Murphy NP;  Method used: Electronically to [facility] [address] [address], [place], [place]  [id number], Ph: [phone], Fax: [phone]    Prescriptions: TRAMADOL HCL 50 MG TABS (TRAMADOL HCL) 1-2 three times a day as needed pain  #50 x 3   Entered and Authorized by:   Luretha Murphy NP   Signed by:   Luretha Murphy NP on 06/02/2009   Method used:   Electronically to        [facility] [address] (retail)       [address]       [place], [place]  [id number]       Ph: [phone]       Fax: [phone]   RxID:   [id number]

## 2010-04-06 NOTE — Assessment & Plan Note (Signed)
Summary: hot flashes/eo   Vital Signs:  Patient profile:   55 year old female Height:      64 inches Weight:      248 pounds BMI:     42.72 Temp:     97.9 degrees F oral Pulse rate:   62 / minute BP sitting:   132 / 84  (left arm) Cuff size:   large  Vitals Entered By: Tessie Fass CMA (October 31, 2009 11:31 AM) CC: hot flashes Is Patient Diabetic? No Pain Assessment Patient in pain? no        Primary Care Provider:  Luretha Murphy NP  CC:  hot flashes.  History of Present Illness: Trial of HRT to control hot flashes cause her to have nausea and she stopped.  She subsequently had some vaginal bleeding.  She still has not gone one year without a menses.  Most bothered by sweats at night.  She does not like the idea of taking hormones and does not want to see a GYN for a second opinion.  She will stop the HRT and try to handle the hot flashes on her own.  She talked a lot about her psychiatric illness today, her private psychiatrist.  She wouldl like to not take her meds but knows that it keeps her stable.  She is worried about her daughter Arlana Hove who does not take care of herself and is pleased with her son at age 22.  He is overweight but does well in school.  Habits & Providers  Alcohol-Tobacco-Diet     Tobacco Status: quit  Current Medications (verified): 1)  Geodon 80 Mg Caps (Ziprasidone Hcl) .... Take 3 Capsule By Mouth Once A Day 2)  Hydrochlorothiazide 25 Mg  Tabs (Hydrochlorothiazide) .... Take 1/2 Tab By Mouth Every Morning 3)  Wellbutrin Sr 150 Mg  Tb12 (Bupropion Hcl) 4)  Aspir-Low 81 Mg Tbec (Aspirin) .... One Daily 5)  Clonazepam 1 Mg  Tabs (Clonazepam) .... Two Times A Day 6)  Base D Polyethylene Glycol  Powd (Polyethylene Glycol 4500) .Marland KitchenMarland KitchenMarland Kitchen 17 Gm in 4 Oz Water Two Times A Day, Qs 7)  Metformin Hcl 500 Mg Tabs (Metformin Hcl) .... Two Times A Day 8)  Hydrocortisone 2.5 % Oint (Hydrocortisone) .... Apply To Rash Bid , 80 Gm Tube 9)  Omeprazole 20 Mg Cpdr  (Omeprazole) .... One Daily 10)  Allopurinol 100 Mg Tabs (Allopurinol) .... One Tab Daily, in 2 Weeks Increase To 2 Tabs Daily 11)  Tramadol Hcl 50 Mg Tabs (Tramadol Hcl) .... One Three Times A Day As Needed Pain  Allergies (verified): 1)  ! Prednisone 2)  ! Doxycycline 3)  ! * Lamisil  Physical Exam  General:  Usual appearing Psych:  normally interactive and good eye contact.     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 The following medications were removed from the medication list:    Estradiol 1 Mg Tabs (Estradiol) ..... One daily  Problem # 2:  GOUT, UNSPECIFIED (ICD-274.9) continue prevention therapy no further bouts Her updated medication list for this problem includes:    Allopurinol 100 Mg Tabs (Allopurinol) ..... One tab daily, in 2 weeks increase to 2 tabs daily  Problem # 3:  HIP PAIN, RIGHT (ICD-719.45) improved on tramadol Her updated medication list for this problem includes:    Aspir-low 81 Mg Tbec (Aspirin) .Marland KitchenMarland KitchenMarland KitchenMarland Kitchen  One daily    Tramadol Hcl 50 Mg Tabs (Tramadol hcl) ..... One three times a day as needed pain  Problem # 4:  SCHIZOPHRENIA (ICD-295.90) very stable, compliant with her meds  Complete Medication List: 1)  Geodon 80 Mg Caps (Ziprasidone hcl) .... Take 3 capsule by mouth once a day 2)  Hydrochlorothiazide 25 Mg Tabs (Hydrochlorothiazide) .... Take 1/2 tab by mouth every morning 3)  Wellbutrin Sr 150 Mg Tb12 (Bupropion hcl) 4)  Aspir-low 81 Mg Tbec (Aspirin) .... One daily 5)  Clonazepam 1 Mg Tabs (Clonazepam) .... Two times a day 6)  Base D Polyethylene Glycol Powd (Polyethylene glycol 4500) .Marland KitchenMarland Kitchen. 17 gm in 4 oz water two times a day, qs 7)  Metformin Hcl 500 Mg Tabs (Metformin hcl) .... Two times a day 8)  Hydrocortisone 2.5 % Oint (Hydrocortisone) .... Apply to rash bid , 80 gm  tube 9)  Omeprazole 20 Mg Cpdr (Omeprazole) .... One daily 10)  Allopurinol 100 Mg Tabs (Allopurinol) .... One tab daily, in 2 weeks increase to 2 tabs daily 11)  Tramadol Hcl 50 Mg Tabs (Tramadol hcl) .... One three times a day as needed pain  Patient Instructions: 1)  return in October for flu shot Prescriptions: HYDROCHLOROTHIAZIDE 25 MG  TABS (HYDROCHLOROTHIAZIDE) Take 1/2 tab by mouth every morning  #30 x 6   Entered and Authorized by:   Luretha Murphy NP   Signed by:   Luretha Murphy NP on 10/31/2009   Method used:   Electronically to        [facility] (retail)       [address]       [place], [place]  [id number]       Ph: [phone]       Fax: [phone]   RxID:   [id number]    Prevention & Chronic Care Immunizations   Influenza vaccine: Fluvax MCR  (12/07/2008)   Influenza vaccine due: 11/23/2008    Tetanus booster: 01/03/2001: Done.   Tetanus booster due: 01/04/2011    Pneumococcal vaccine: Not documented  Colorectal Screening   Hemoccult: abnormal  (09/04/2007)   Hemoccult action/deferral: Not indicated  (04/05/2009)   Hemoccult due: 09/03/2008    Colonoscopy: abnormal  (09/30/2007)   Colonoscopy due: 09/29/2012  Other Screening   Pap smear: NEGATIVE FOR INTRAEPITHELIAL LESIONS OR MALIGNANCY.  (04/27/2008)   Pap smear action/deferral: Deferred-3 yr interval  (04/05/2009)   Pap smear due: 04/06/2011    Mammogram: ASSESSMENT: Negative - BI-RADS 1^MM DIGITAL SCREENING  (05/20/2009)   Mammogram due: 05/18/2008   Smoking status: quit  (10/31/2009)  Lipids   Total Cholesterol: 132  (05/30/2009)   LDL: 66  (05/30/2009)   LDL Direct: 83  (04/01/2007)   HDL: 43  (05/30/2009)   Triglycerides: 114  (05/30/2009)  Hypertension   Last Blood Pressure: 132 / 84  (10/31/2009)   Serum creatinine: 0.80  (05/30/2009)   Serum potassium 4.2  (05/30/2009)  Self-Management Support :   Personal Goals (by the next clinic visit) :       Personal blood pressure goal: 130/80  (11/30/2008)   Hypertension self-management support: Written self-care plan  (07/08/2009)  Appended Document: hot flashes/eo    Clinical Lists Changes  Orders: Added new Test order of [facility]- Est Level  3 (24401) - Signed

## 2010-04-06 NOTE — Progress Notes (Signed)
  Phone Note Call from Patient   Caller: Patient Call For: [phone] Summary of Call: Jacqueline Reese Bible c/o of rt foot burning to foot. Initial call taken by: Britta Mccreedy mcgregor  Follow-up for Phone Call        states this has happened before. R foot. same as the one she has gout in. taking all meds as ordered. wants to know if it is a side effect of any f her meds. unable to come for an appt. told her I will ask her pcp & call her back Follow-up by: Golden Circle RN,  November 21, 2009 11:37 AM  Additional Follow-up for Phone Call Additional follow up Details #1::        Burning sensation lasts a few minutes and comes and goes.  She is worried that she might have "sugar'. reasured her that blood sugars have been normal. Additional Follow-up by: Luretha Murphy NP,  November 21, 2009 12:11 PM

## 2010-04-06 NOTE — Assessment & Plan Note (Signed)
Summary: RECHECK FOOT/KH   Vital Signs:  Patient profile:   55 year old female Height:      64 inches Weight:      246 pounds BMI:     42.38 Temp:     99.0 degrees F oral Pulse rate:   80 / minute BP sitting:   136 / 81  (left arm) Cuff size:   large  Vitals Entered By: Tessie Fass CMA (January 23, 2010 8:41 AM) Is Patient Diabetic? Yes Pain Assessment Patient in pain? no        Primary Care Provider:  Luretha Murphy NP   History of Present Illness: 55 year old female comes to clinic for recheck of toe ulcer.  Was seen in clinic last Thurs and Rx Bactrim.  She is taking Bactrim 1 tab two times a day because 2 tab two times a day caused nausea.  She states that the ulcer is better and that there is less drainage from it.    ROS: Denies foot pain +R ankle sweling.   Denies fever, chills, pain that wakes her up at night, SOB, N/V.  Current Medications (verified): 1)  Geodon 80 Mg Caps (Ziprasidone Hcl) .... Take 3 Capsule By Mouth Once A Day 2)  Hydrochlorothiazide 25 Mg  Tabs (Hydrochlorothiazide) .... Take Tab By Mouth Every Morning 3)  Wellbutrin Sr 150 Mg  Tb12 (Bupropion Hcl) 4)  Aspir-Low 81 Mg Tbec (Aspirin) .... One Daily 5)  Clonazepam 1 Mg  Tabs (Clonazepam) .... Two Times A Day 6)  Base D Polyethylene Glycol  Powd (Polyethylene Glycol 4500) .Marland KitchenMarland KitchenMarland Kitchen 17 Gm in 4 Oz Water Two Times A Day, Qs 7)  Metformin Hcl 500 Mg Tabs (Metformin Hcl) .... Two Times A Day 8)  Hydrocortisone 2.5 % Oint (Hydrocortisone) .... Apply To Rash Bid , 80 Gm Tube 9)  Omeprazole 20 Mg Cpdr (Omeprazole) .... One Daily 10)  Allopurinol 100 Mg Tabs (Allopurinol) .... One Tab Daily, in 2 Weeks Increase To 2 Tabs Daily 11)  Tramadol Hcl 50 Mg Tabs (Tramadol Hcl) .... One Three Times A Day As Needed Pain 12)  Diclofenac Sodium 75 Mg Tbec (Diclofenac Sodium) .... One Two Times A Day As Needed Pain 13)  Bactrim Ds 800-160 Mg Tabs (Sulfamethoxazole-Trimethoprim) .... Take Two Tablets Every 12 Hours As  Scheduled. 14)  Glipizide 2.5 Mg Xr24h-Tab (Glipizide) .... Take One Tab Once Daily  Allergies (verified): 1)  ! Prednisone 2)  ! Doxycycline 3)  ! * Lamisil  Physical Exam  General:  Well-developed,well-nourished,in no acute distress; alert,appropriate and cooperative throughout examination. Vitals reviewed.  Msk:  Normal ROM of all joints, no deformities, or tenderness.   Extremities:  No warmth, trace right pedal edema, non-pitting.  Open ulcer between 2nd and 3rd digits, size .5cm x 1cm. No drainage from ulcer.  Normal gait.  Sensation intact to light touch. No streaking. No redness to rest of foot.   Impression & Recommendations:  Problem # 1:  FOOT ULCER, RIGHT (ICD-707.10) Assessment Improved Foot ulcer better since seen last week.  Pt did not tolerate 2 tabs of bactrim two times a day, so she is taking it 1 tab two times a day.  Advised her to continue this.  Pt to rtc in 1 wk for recheck, red flags given for return earlier (fever, chills, worsening redness, swelling, drainage).  Pt agreed.    Orders: [facility]- Est Level  3 (16109)  Problem # 2:  HYPERTENSION, BENIGN SYSTEMIC (ICD-401.1) Assessment: Comment Only Pt  states that she has not taken the 1/2 tab of this medication at all, she has been taking full tablet.  Looking at chart, change was made in Aug, but pt did not make this change.  Looking at 01/2010 labs, electrolytes wnl and renal function wnl.  Given that BP is at goal on this dose and labs look ok, will continue HCTZ 25mg  daily.     Her updated medication list for this problem includes:    Hydrochlorothiazide 25 Mg Tabs (Hydrochlorothiazide) .Marland Kitchen... Take 1 tab by mouth every morning  Orders: [facility]- Est Level  3 (99213)  Complete Medication List: 1)  Geodon 80 Mg Caps (Ziprasidone hcl) .... Take 3 capsule by mouth once a day 2)  Hydrochlorothiazide 25 Mg Tabs (Hydrochlorothiazide) .... Take 1 tab by mouth every morning 3)  Wellbutrin Sr 150 Mg Tb12 (Bupropion  hcl) 4)  Aspir-low 81 Mg Tbec (Aspirin) .... One daily 5)  Clonazepam 1 Mg Tabs (Clonazepam) .... Two times a day 6)  Base D Polyethylene Glycol Powd (Polyethylene glycol 4500) .Marland KitchenMarland Kitchen. 17 gm in 4 oz water two times a day, qs 7)  Metformin Hcl 500 Mg Tabs (Metformin hcl) .... Two times a day 8)  Hydrocortisone 2.5 % Oint (Hydrocortisone) .... Apply to rash bid , 80 gm tube 9)  Omeprazole 20 Mg Cpdr (Omeprazole) .... One daily 10)  Allopurinol 100 Mg Tabs (Allopurinol) .... One tab daily, in 2 weeks increase to 2 tabs daily 11)  Tramadol Hcl 50 Mg Tabs (Tramadol hcl) .... One three times a day as needed pain 12)  Diclofenac Sodium 75 Mg Tbec (Diclofenac sodium) .... One two times a day as needed pain 13)  Bactrim Ds 800-160 Mg Tabs (Sulfamethoxazole-trimethoprim) .... Take two tablets every 12 hours as scheduled. 14)  Glipizide 2.5 Mg Xr24h-tab (Glipizide) .... Take one tab once daily  Patient Instructions: 1)  Please schedule a follow-up appointment next Monday at 8:30 for foot ulcer. 2)  Continue antibiotic as directed.  3)  Take your new diabetes medications.     Orders Added: 1)  [facility]- Est Level  3 [16109]

## 2010-04-06 NOTE — Progress Notes (Signed)
Summary: triage  Phone Note Call from Patient Call back at Home Phone [phone]   Caller: Patient Summary of Call: Pt asking to speak to [facility]. Initial call taken by: [name],  May 18, 2009 12:01 PM  Follow-up for Phone Call        wants to take herbal life? hard to understand her speech at times. wants to know if it is safe to help her lose weight. told her i will send to her pcp & will call back with answer Follow-up by: Golden Circle RN,  May 18, 2009 12:13 PM  Additional Follow-up for Phone Call Additional follow up Details #1::        discouruage any use fo OTC diet pills, she has been talked to many times about diet and exercise. Additional Follow-up by: Luretha Murphy NP,  May 18, 2009 5:02 PM

## 2010-04-06 NOTE — Progress Notes (Signed)
Summary: triage  Phone Note Call from Patient   Caller: Patient Summary of Call: Pt having alot of stiff joints.  Requesting to speak to Luretha Murphy. Initial call taken by: [name],  May 06, 2009 1:59 PM  Follow-up for Phone Call        c/o stiff joints. states she takes the ibu once in a while. advised taking it every 8 hrs as ordered. she agreed to try. to eat before taking it Follow-up by: Golden Circle RN,  May 06, 2009 2:09 PM

## 2010-04-06 NOTE — Progress Notes (Signed)
Summary: results  Phone Note Call from Patient Call back at Home Phone [phone]   Caller: Patient Summary of Call: wants to the results of xrays Initial call taken by: De Nurse,  March 07, 2009 2:20 PM  Follow-up for Phone Call        told her arthritis shows on films. she wants pcp to call her friday Follow-up by: Golden Circle RN,  March 07, 2009 2:29 PM  Additional Follow-up for Phone Call Additional follow up Details #1::        pt returned call Additional Follow-up by: De Nurse,  March 10, 2009 9:28 AM    Additional Follow-up for Phone Call Additional follow up Details #2::    called 03/10/09, see note Follow-up by: Luretha Murphy NP,  March 10, 2009 10:56 AM

## 2010-04-06 NOTE — Progress Notes (Signed)
Summary: phn msg/called pt/ts  Phone Note Call from Patient Call back at Home Phone [phone]   Caller: Patient Summary of Call: Wants to talk to Jacqueline Reese about losing weight. Initial call taken by: [name],  Jul 18, 2009 3:39 PM  Follow-up for Phone Call        called pt. [name] is in clinic. Pt said, that she thinks that her pain and gout meds make her gain weight and she does not want to take her meds any more. Advised to watch food intake and exercise. Will call her back, after [name]. Follow-up by: [name],,  Jul 18, 2009 3:47 PM  Additional Follow-up for Phone Call Additional follow up Details #1::        Tried to call, not at home.  It is up to her as to whether she takes her meds or not.  She was diagnosed with gout and this is the standard of treatment prescribed, as she had several visits including to Urgent care for such. Additional Follow-up by: Jacqueline Murphy NP,  Jul 19, 2009 10:59 AM

## 2010-04-06 NOTE — Progress Notes (Signed)
  Phone Note Call from Patient   Caller: Patient Call For: [phone] Summary of Call: Call patient back with lab results and to discuss dizziness before 1:00 today. Initial call taken by: Abundio Miu,  February 22, 2010 8:49 AM  Follow-up for Phone Call        discussed blood sugar elevation and increase in metformin.  She got dizzy going from lying to standing, she is on a number of psychotropic meds that could cause this, counseled on moving slowly.  She will need referral to Diabetic Management Classes she is resistant, she will come in the week after New Years. Follow-up by: Luretha Murphy NP,  February 22, 2010 9:24 AM

## 2010-04-06 NOTE — Progress Notes (Signed)
Summary: meds prob  Phone Note Call from Patient Call back at Home Phone [phone]   Caller: Patient Summary of Call: pt is taking 1/2 of HCTZ and is giving her a HA & wants to increase to whole Initial call taken by: De Nurse,  January 19, 2010 4:16 PM  Follow-up for Phone Call        spoke with patient  and she states she has always taken a whole HCTZ. now she has realized that her bottle states take 1/2 tablet. she did take 1/2 today and now she has a headache and is concerned about her BP.  reassured her that BP was in a good range this AM at her appointment. will send message to Dr. Sheffield Slider or to ask if need to wait for Luretha Murphy  to advise on Monday Follow-up by: Theresia Lo RN,  January 19, 2010 4:44 PM

## 2010-04-06 NOTE — Assessment & Plan Note (Signed)
Summary: fu per Jacqueline Reese/kh   Vital Signs:  Patient profile:   55 year old female Height:      64 inches Weight:      250 pounds BMI:     43.07 Temp:     98.9 degrees F oral Pulse rate:   81 / minute BP sitting:   129 / 85  (left arm) Cuff size:   large  Vitals Entered By: Tessie Fass CMA (January 30, 2010 8:34 AM) CC: F/U Is Patient Diabetic? Yes Pain Assessment Patient in pain? no        Primary Care Provider:  Luretha Murphy NP  CC:  F/U.  History of Present Illness: 54 year old female comes to clinic for recheck of toe ulcer.  Was seen in clinic last wee and Rx Bactrim.  She is taking Bactrim 1 tab two times a day because 2 tab two times a day caused nausea.  She has 15 tablets left (7 days).  She took one this morning.  She states that the ulcer is better and that there is no drainage from it.    ROS: Denies foot pain - R ankle sweling.   Denies fever, chills, pain that wakes her up at night, SOB, N/V.    Habits & Providers  Alcohol-Tobacco-Diet     Tobacco Status: quit  Current Medications (verified): 1)  Geodon 80 Mg Caps (Ziprasidone Hcl) .... Take 3 Capsule By Mouth Once A Day 2)  Hydrochlorothiazide 25 Mg  Tabs (Hydrochlorothiazide) .... Take 1 Tab By Mouth Every Morning 3)  Wellbutrin Sr 150 Mg  Tb12 (Bupropion Hcl) 4)  Aspir-Low 81 Mg Tbec (Aspirin) .... One Daily 5)  Clonazepam 1 Mg  Tabs (Clonazepam) .... Two Times A Day 6)  Base D Polyethylene Glycol  Powd (Polyethylene Glycol 4500) .Marland KitchenMarland KitchenMarland Kitchen 17 Gm in 4 Oz Water Two Times A Day, Qs 7)  Metformin Hcl 500 Mg Tabs (Metformin Hcl) .... Two Times A Day 8)  Hydrocortisone 2.5 % Oint (Hydrocortisone) .... Apply To Rash Bid , 80 Gm Tube 9)  Omeprazole 20 Mg Cpdr (Omeprazole) .... One Daily 10)  Allopurinol 100 Mg Tabs (Allopurinol) .... One Tab Daily, in 2 Weeks Increase To 2 Tabs Daily 11)  Tramadol Hcl 50 Mg Tabs (Tramadol Hcl) .... One Three Times A Day As Needed Pain 12)  Diclofenac Sodium 75 Mg Tbec (Diclofenac  Sodium) .... One Two Times A Day As Needed Pain 13)  Glipizide 2.5 Mg Xr24h-Tab (Glipizide) .... Take One Tab Once Daily  Allergies (verified): 1)  ! Prednisone 2)  ! Doxycycline 3)  ! * Lamisil  Review of Systems       per hpi   Physical Exam  General:  Well-developed,well-nourished,in no acute distress; alert,appropriate and cooperative throughout examination. Vitals reviewed.  Msk:  Normal ROM of all joints, no deformities, or tenderness.   Extremities:  No warmth, no right pedal edema.  Closed ulcer between 2nd and 3rd digits, size .5cm x 1cm. No drainage from ulcer.  Normal gait.  Sensation intact to light touch. No streaking. No redness to rest of foot.   Impression & Recommendations:  Problem # 1:  FOOT ULCER, RIGHT (ICD-707.10) Assessment Improved  Ulcer much improved.  Has closed.  No fever, chills, drainage, redness, swelling, streaking.  Will continue Bacrtim 1 tab two times a day (instead of 2 tabs two times a day) until all finished (7 more days).  She will cont to take glipizide 2.5mg  AM (new) and Metformin 500mg   two times a day.  Will rtc in 1 month to see pcp for dm f/u, sooner for acute issues.    Orders: [facility]- Est Level  3 (99213)  Complete Medication List: 1)  Geodon 80 Mg Caps (Ziprasidone hcl) .... Take 3 capsule by mouth once a day 2)  Hydrochlorothiazide 25 Mg Tabs (Hydrochlorothiazide) .... Take 1 tab by mouth every morning 3)  Wellbutrin Sr 150 Mg Tb12 (Bupropion hcl) 4)  Aspir-low 81 Mg Tbec (Aspirin) .... One daily 5)  Clonazepam 1 Mg Tabs (Clonazepam) .... Two times a day 6)  Base D Polyethylene Glycol Powd (Polyethylene glycol 4500) .Marland KitchenMarland Kitchen. 17 gm in 4 oz water two times a day, qs 7)  Metformin Hcl 500 Mg Tabs (Metformin hcl) .... Two times a day 8)  Hydrocortisone 2.5 % Oint (Hydrocortisone) .... Apply to rash bid , 80 gm tube 9)  Omeprazole 20 Mg Cpdr (Omeprazole) .... One daily 10)  Allopurinol 100 Mg Tabs (Allopurinol) .... One tab daily, in 2  weeks increase to 2 tabs daily 11)  Tramadol Hcl 50 Mg Tabs (Tramadol hcl) .... One three times a day as needed pain 12)  Diclofenac Sodium 75 Mg Tbec (Diclofenac sodium) .... One two times a day as needed pain 13)  Glipizide 2.5 Mg Xr24h-tab (Glipizide) .... Take one tab once daily  Patient Instructions: 1)  Please schedule a follow-up appointment in 1 month with Luretha Murphy for diabetes.  2)  You are doing a great job with keeping your foot dry and clean.  3)  Continue taking your antibiotic two times a day. 4)  Take all your diabetes and other medicines as directed.    Orders Added: 1)  [facility]- Est Level  3 [13086]

## 2010-04-06 NOTE — Progress Notes (Signed)
Summary: phn msg  Phone Note Call from Patient Call back at Home Phone [phone]   Caller: Patient Summary of Call: pt is needing to talk to nurse Initial call taken by: De Nurse,  May 23, 2009 10:54 AM  Follow-up for Phone Call        she returned my call about the herbal life. told her pcp not ok with this. do not take it. she was ok with answer Follow-up by: Golden Circle RN,  May 23, 2009 10:57 AM

## 2010-04-06 NOTE — Miscellaneous (Signed)
  Clinical Lists Changes  Orders: Added new Test order of [facility]- Est Level  2 (91478) - Signed

## 2010-04-06 NOTE — Miscellaneous (Signed)
  Clinical Lists Changes  Observations: Added new observation of PAPRECACT: Deferred-3 yr interval (04/05/2009 16:14) Added new observation of HEMOCCRECACT: Not indicated (04/05/2009 16:14) Added new observation of DM PROGRESS: N/A (04/05/2009 16:14) Added new observation of DM FSREVIEW: N/A (04/05/2009 16:14)      Prevention & Chronic Care Immunizations   Influenza vaccine: Fluvax MCR  (12/07/2008)   Influenza vaccine due: 11/23/2008    Tetanus booster: 01/03/2001: Done.   Tetanus booster due: 01/04/2011    Pneumococcal vaccine: Not documented  Colorectal Screening   Hemoccult: abnormal  (09/04/2007)   Hemoccult action/deferral: Not indicated  (04/05/2009)   Hemoccult due: 09/03/2008    Colonoscopy: abnormal  (09/30/2007)   Colonoscopy due: 09/29/2012  Other Screening   Pap smear: NEGATIVE FOR INTRAEPITHELIAL LESIONS OR MALIGNANCY.  (04/27/2008)   Pap smear action/deferral: Deferred-3 yr interval  (04/05/2009)   Pap smear due: 04/30/2008    Mammogram: ASSESSMENT: Negative - BI-RADS 1^MM DIGITAL SCREENING  (05/19/2008)   Mammogram due: 05/18/2008   Smoking status: quit  (03/03/2009)  Lipids   Total Cholesterol: Not documented   LDL: 87  (04/01/2007)   LDL Direct: 83  (04/01/2007)   HDL: Not documented   Triglycerides: Not documented    SGOT (AST): 17  (10/11/2008)   SGPT (ALT): 16  (10/11/2008)   Alkaline phosphatase: 54  (10/11/2008)   Total bilirubin: 0.3  (10/11/2008)  Hypertension   Last Blood Pressure: 134 / 81  (03/03/2009)   Serum creatinine: 0.74  (10/11/2008)   Serum potassium 3.9  (10/11/2008)  Self-Management Support :   Personal Goals (by the next clinic visit) :      Personal blood pressure goal: 130/80  (11/30/2008)     Personal LDL goal: 130  (11/30/2008)    Hypertension self-management support: Not documented    Lipid self-management support: Not documented

## 2010-04-06 NOTE — Progress Notes (Signed)
Summary: phn msg  Phone Note Call from Patient Call back at Home Phone [phone]   Caller: Patient Summary of Call: Says the medicine that Darl Pikes put her on for her hot flashes is not working. Initial call taken by: [name],  October 12, 2009 10:05 AM  Follow-up for Phone Call        she needs to give it 4-6 weeks. Follow-up by: Luretha Murphy NP,  October 12, 2009 12:29 PM

## 2010-04-06 NOTE — Progress Notes (Signed)
Summary: Rx Req-electronically sent 3/30  Phone Note Call from Patient Call back at Home Phone [phone]   Caller: Patient Summary of Call: Pt asking if she can get an rx for yeast inf called in. Initial call taken by: [name],  June 01, 2009 1:54 PM  Follow-up for Phone Call        will forward to Luretha Murphy. Follow-up by: Theresia Lo RN,  June 01, 2009 3:03 PM  Additional Follow-up for Phone Call Additional follow up Details #1::        sent in to her pharmacy. Additional Follow-up by: Luretha Murphy NP,  June 01, 2009 3:06 PM

## 2010-04-06 NOTE — Progress Notes (Signed)
Summary: change in medication  Phone Note Call from Patient Call back at Home Phone [phone]   Reason for Call: Talk to Nurse Summary of Call: pt requesting to lower her dosage in asprin Initial call taken by: Knox Royalty,  August 03, 2009 2:38 PM    New/Updated Medications: ASPIR-LOW 81 MG TBEC (ASPIRIN) one daily

## 2010-04-06 NOTE — Progress Notes (Signed)
Summary: triage  Phone Note Call from Patient Call back at Home Phone [phone]   Caller: Patient Summary of Call: pt wants to know if she can take Ibuprofen for her pain. Initial call taken by: De Nurse,  Jul 06, 2009 1:43 PM  Follow-up for Phone Call        Told her it was ok to take the Ibuprofen but to make sure not to take it any sooner that every 4 hours.  Voiced understanding. Follow-up by: Dennison Nancy RN,  Jul 06, 2009 1:59 PM

## 2010-04-06 NOTE — Progress Notes (Signed)
Summary: phn msg  Phone Note Call from Patient Call back at Home Phone [phone]   Caller: Patient Summary of Call: has yeast inf and wants something called in Initial call taken by: De Nurse,  December 08, 2009 9:40 AM  Follow-up for Phone Call        was done electronically Follow-up by: Luretha Murphy NP,  December 08, 2009 10:58 AM

## 2010-04-06 NOTE — Letter (Signed)
Summary: Generic Letter  [facility]  [address]   [place], [place] [address]   Phone: [phone]  Fax: [phone]    05/31/2009  Jacqueline Reese [address] [place], [place]  [address]  Dear Ms. Dethloff,   Your blood work is very normal.  Your blood sugar was a littl high but that is why we have you on the low dose metformin, to treat what I would refer to as pre-diabetes.  Your cholesterol was perfect.  Try to exercise as much as you can even if it is marching in place at home.  Keep active and eat healthy that is the best medicine.        Sincerely,   Luretha Murphy NP  Appended Document: Generic Letter mailed.

## 2010-04-06 NOTE — Progress Notes (Signed)
  Pt called: [phone] Pt says she is having pain in her toe (shooting pains) and that is gets worse from time to time. Worse in the morning. Suggested taking Tylenol 650 mg every 6 hours. Pt says she does not have any Tylenol but can get some in the morning. I suggested getting either regular Tylenol or Tylenol Arthritis. Told Pt I would let Luretha Murphy know. If she has more pain not controlled by her Diclofenax and Tylenol then she should make appt with Darl Pikes. She agrees.   Appended Document:  Added tramadol 50 mg three times a day as needed.  Patient has poor understanding of self care.  Tried to teach her not to call the doctor in the evennings unless it is an emergency.  Mental Illness interfers with her abilitiy to understand.

## 2010-04-06 NOTE — Progress Notes (Signed)
Summary: CALLED PT/FYI/TS  Phone Note Call from Patient Call back at Home Phone [phone]   Summary of Call: Pt says she is waiting on a phone call from Luretha Murphy. Initial call taken by: [name],  Jul 22, 2009 12:27 PM  Follow-up for Phone Call        called pt and advised per [name], that she can d/c her gout meds. and also discussed again how to loose weight. pt asked the same questions again and I asked her to write down all of her concerns and sched. ov to discuss. [name] is in clinic and can not call her every time she calls. Pt agreed. Follow-up by: [name],,  Jul 22, 2009 3:40 PM

## 2010-04-06 NOTE — Progress Notes (Signed)
Summary: triage  Phone Note Call from Patient Call back at Home Phone [phone]   Caller: Patient Summary of Call: Pt having side effects from medication she was given for hot flashes. Initial call taken by: [name],  October 14, 2009 4:11 PM  Follow-up for Phone Call        spoke with patient and she reports some nausea since starting medication and she is still having hot flashes. advised it may take  several weeks to notice a difference. advised to be sure sure she is taking medicine  after eating to maybe help with nausea. she has not had vomiting. advised if nausea continuing to let us know. Follow-up by: Theresia Lo RN,  October 14, 2009 4:28 PM

## 2010-04-06 NOTE — Progress Notes (Signed)
Summary: returning call  Phone Note Call from Patient Call back at Home Phone [phone]   Reason for Call: Talk to Nurse Summary of Call: pt is requesting MD call Initial call taken by: Knox Royalty,  June 07, 2009 11:54 AM  Follow-up for Phone Call        Called she is not sure she wants to take medicine for her gout.  She does not want to take medicine the rest of her life.  I explained several times that this is prevention.  She will talk to her pharmacist and make an apointment with me in one month Follow-up by: Luretha Murphy NP,  June 07, 2009 3:20 PM

## 2010-04-06 NOTE — Miscellaneous (Signed)
[facility]  [facility]: [facility]   Imported By: Knox Royalty 03/30/2010 10:18:26  _____________________________________________________________________  External Attachment:    Type:   Image     Comment:   External Document

## 2010-04-06 NOTE — Progress Notes (Signed)
Summary: phn msg  Phone Note Call from Patient Call back at Home Phone [phone]   Caller: Patient Summary of Call: Wants to talk to Luretha Murphy about her medication. Initial call taken by: [name],  June 09, 2009 2:33 PM  Follow-up for Phone Call        reclarified proper way to take medicaion for gout Follow-up by: Luretha Murphy NP,  June 09, 2009 3:07 PM

## 2010-04-06 NOTE — Assessment & Plan Note (Signed)
Summary: leg swollen,df   Vital Signs:  Patient profile:   55 year old female Height:      64 inches Weight:      249 pounds BMI:     42.90 Temp:     97.8 degrees F oral Pulse rate:   73 / minute BP sitting:   131 / 80  (left arm) Cuff size:   large  Vitals Entered By: Tessie Fass CMA (January 03, 2010 11:32 AM) CC: right leg edema x 1 week Is Patient Diabetic? No Pain Assessment Patient in pain? no        Primary Care Gagandeep Kossman:  Luretha Murphy NP  CC:  right leg edema x 1 week.  History of Present Illness: Right leg was swollen last week, her daughter pointed it out to her.  She denies pain.  She does consume a lot of sodium by eating frozen dinners.  Current Medications (verified): 1)  Geodon 80 Mg Caps (Ziprasidone Hcl) .... Take 3 Capsule By Mouth Once A Day 2)  Hydrochlorothiazide 25 Mg  Tabs (Hydrochlorothiazide) .... Take 1/2 Tab By Mouth Every Morning 3)  Wellbutrin Sr 150 Mg  Tb12 (Bupropion Hcl) 4)  Aspir-Low 81 Mg Tbec (Aspirin) .... One Daily 5)  Clonazepam 1 Mg  Tabs (Clonazepam) .... Two Times A Day 6)  Base D Polyethylene Glycol  Powd (Polyethylene Glycol 4500) .Marland KitchenMarland KitchenMarland Kitchen 17 Gm in 4 Oz Water Two Times A Day, Qs 7)  Metformin Hcl 500 Mg Tabs (Metformin Hcl) .... Two Times A Day 8)  Hydrocortisone 2.5 % Oint (Hydrocortisone) .... Apply To Rash Bid , 80 Gm Tube 9)  Omeprazole 20 Mg Cpdr (Omeprazole) .... One Daily 10)  Allopurinol 100 Mg Tabs (Allopurinol) .... One Tab Daily, in 2 Weeks Increase To 2 Tabs Daily 11)  Tramadol Hcl 50 Mg Tabs (Tramadol Hcl) .... One Three Times A Day As Needed Pain 12)  Diclofenac Sodium 75 Mg Tbec (Diclofenac Sodium) .... One Two Times A Day As Needed Pain  Allergies (verified): 1)  ! Prednisone 2)  ! Doxycycline 3)  ! * Lamisil  Review of Systems       The patient complains of peripheral edema.  The patient denies anorexia, fever, weight loss, weight gain, chest pain, dyspnea on exertion, prolonged cough, headaches,  abdominal pain, and severe indigestion/heartburn.    Physical Exam  General:  Obese, AA female in no distress Heart:  normal rate, regular rhythm, and no murmur.   Extremities:  No clubbing, cyanosis, edema, or deformity noted with normal full range of motion of all joints.     Impression & Recommendations:  Problem # 1:  EDEMA (ICD-782.3)  subjective complaint, no objective signs today, counseled on causes and sodium restriction Her updated medication list for this problem includes:    Hydrochlorothiazide 25 Mg Tabs (Hydrochlorothiazide) .Marland Kitchen... Take 1/2 tab by mouth every morning  Orders: [facility]- Est Level  3 (99213)  Complete Medication List: 1)  Geodon 80 Mg Caps (Ziprasidone hcl) .... Take 3 capsule by mouth once a day 2)  Hydrochlorothiazide 25 Mg Tabs (Hydrochlorothiazide) .... Take 1/2 tab by mouth every morning 3)  Wellbutrin Sr 150 Mg Tb12 (Bupropion hcl) 4)  Aspir-low 81 Mg Tbec (Aspirin) .... One daily 5)  Clonazepam 1 Mg Tabs (Clonazepam) .... Two times a day 6)  Base D Polyethylene Glycol Powd (Polyethylene glycol 4500) .Marland KitchenMarland Kitchen. 17 gm in 4 oz water two times a day, qs 7)  Metformin Hcl 500 Mg Tabs (Metformin hcl) .Marland KitchenMarland KitchenMarland Kitchen  Two times a day 8)  Hydrocortisone 2.5 % Oint (Hydrocortisone) .... Apply to rash bid , 80 gm tube 9)  Omeprazole 20 Mg Cpdr (Omeprazole) .... One daily 10)  Allopurinol 100 Mg Tabs (Allopurinol) .... One tab daily, in 2 weeks increase to 2 tabs daily 11)  Tramadol Hcl 50 Mg Tabs (Tramadol hcl) .... One three times a day as needed pain 12)  Diclofenac Sodium 75 Mg Tbec (Diclofenac sodium) .... One two times a day as needed pain   Orders Added: 1)  [facility]- Est Level  3 [95621]   Immunization History:  Influenza Immunization History:    Influenza:  historical (01/03/2010)   Immunization History:  Influenza Immunization History:    Influenza:  Historical (01/03/2010)   Prevention & Chronic Care Immunizations   Influenza vaccine: Historical   (01/03/2010)   Influenza vaccine due: 11/23/2008    Tetanus booster: 01/03/2001: Done.   Tetanus booster due: 01/04/2011    Pneumococcal vaccine: Not documented  Colorectal Screening   Hemoccult: abnormal  (09/04/2007)   Hemoccult action/deferral: Not indicated  (04/05/2009)   Hemoccult due: 09/03/2008    Colonoscopy: abnormal  (09/30/2007)   Colonoscopy due: 09/29/2012  Other Screening   Pap smear: NEGATIVE FOR INTRAEPITHELIAL LESIONS OR MALIGNANCY.  (04/27/2008)   Pap smear action/deferral: Deferred-3 yr interval  (04/05/2009)   Pap smear due: 04/06/2011    Mammogram: ASSESSMENT: Negative - BI-RADS 1^MM DIGITAL SCREENING  (05/20/2009)   Mammogram due: 05/18/2008   Smoking status: quit  (10/31/2009)  Lipids   Total Cholesterol: 132  (05/30/2009)   LDL: 66  (05/30/2009)   LDL Direct: 83  (04/01/2007)   HDL: 43  (05/30/2009)   Triglycerides: 114  (05/30/2009)  Hypertension   Last Blood Pressure: 131 / 80  (01/03/2010)   Serum creatinine: 0.80  (05/30/2009)   Serum potassium 4.2  (05/30/2009)    Hypertension flowsheet reviewed?: Yes   Progress toward BP goal: At goal  Self-Management Support :   Personal Goals (by the next clinic visit) :      Personal blood pressure goal: 130/80  (11/30/2008)   Patient will work on the following items until the next clinic visit to reach self-care goals:     Eating: drink diet soda or water instead of juice or soda, eat more vegetables, use fresh or frozen vegetables, eat foods that are low in salt, eat baked foods instead of fried foods, eat fruit for snacks and desserts  (01/03/2010)    Hypertension self-management support: Written self-care plan  (07/08/2009)

## 2010-04-06 NOTE — Assessment & Plan Note (Signed)
Summary: swollen foot and blister,df   Vital Signs:  Patient profile:   55 year old female Weight:      252 pounds Pulse rate:   80 / minute BP sitting:   130 / 80  (right arm)  Vitals Entered By: Renato Battles slade,cma CC: check feet. swelling.   Primary Care Provider:  Luretha Murphy NP  CC:  check feet. swelling.Marland Kitchen  History of Present Illness: 55 year old female comes to clinic for feet swelling.  Right ankle has been swollen x6 days.  It has become progressively worse.  Says it is different from acute gout.  Pt says a blister formed between her 2nd and 3rd toes 7 days ago; she peeled it off and it has been open and draining ever since.  Describes the pain as sore.  Nothing worsens the symptoms, nothing alleviates the symptoms.    ROS: Endorses mild right foot pain, R ankle sweling.  Denies fever, chills, pain that wakes her up at night, SOB, N/V.  Current Medications (verified): 1)  Geodon 80 Mg Caps (Ziprasidone Hcl) .... Take 3 Capsule By Mouth Once A Day 2)  Hydrochlorothiazide 25 Mg  Tabs (Hydrochlorothiazide) .... Take 1/2 Tab By Mouth Every Morning 3)  Wellbutrin Sr 150 Mg  Tb12 (Bupropion Hcl) 4)  Aspir-Low 81 Mg Tbec (Aspirin) .... One Daily 5)  Clonazepam 1 Mg  Tabs (Clonazepam) .... Two Times A Day 6)  Base D Polyethylene Glycol  Powd (Polyethylene Glycol 4500) .Marland KitchenMarland KitchenMarland Kitchen 17 Gm in 4 Oz Water Two Times A Day, Qs 7)  Metformin Hcl 500 Mg Tabs (Metformin Hcl) .... Two Times A Day 8)  Hydrocortisone 2.5 % Oint (Hydrocortisone) .... Apply To Rash Bid , 80 Gm Tube 9)  Omeprazole 20 Mg Cpdr (Omeprazole) .... One Daily 10)  Allopurinol 100 Mg Tabs (Allopurinol) .... One Tab Daily, in 2 Weeks Increase To 2 Tabs Daily 11)  Tramadol Hcl 50 Mg Tabs (Tramadol Hcl) .... One Three Times A Day As Needed Pain 12)  Diclofenac Sodium 75 Mg Tbec (Diclofenac Sodium) .... One Two Times A Day As Needed Pain 13)  Bactrim Ds 800-160 Mg Tabs (Sulfamethoxazole-Trimethoprim) .... Take Two Tablets Every 12  Hours As Scheduled.  Allergies (verified): 1)  ! Prednisone 2)  ! Doxycycline 3)  ! * Lamisil  Review of Systems       per HPI  Physical Exam  Extremities:  Warm, 2+ right pedal edema, non-pitting.  Open ulcer between 2nd and 3rd digits; clear drainage from ulcer.  Normal gait.  Sensation intact to light touch. No calf or thigh tendernes  Diabetes Management Exam:    Foot Exam (with socks and/or shoes not present):       Sensory-Monofilament:          Left foot: normal          Right foot: normal       Nails:          Left foot: normal          Right foot: normal   Impression & Recommendations:  Problem # 1:  FOOT ULCER, RIGHT (ICD-707.10) Will get wound culture.  Patient has not been diagnosed with DM yet, but we are concerned about the foot ulcer.  We will get a glucose cap today.   Will treat with Bactrim DS 2 tab two times a day x10 days.  Advised pt to take 1 tablet twice daily if she experiences GI side effects.  Advised pt to call  MD or RTC if symptoms become worse.  Patient is to return for follow-up appointment on Monday 11/21 to re-assess the wound.    Orders: Culture, Wound -[facility] (16109) Glucose Cap-[facility] (60454) [facility]- Est Level  3 (09811)  Problem # 2:  OBESITY, NOS (ICD-278.00) Patient has  metabolic disorder.  Will get A1C today.  Will also check CBC and CMET.  Patient currently taking Metformin.  May consider increasing Metformin to maximum dose at next visit.    Orders: A1C-[facility] (91478) Comp Met-[facility] ([address]562-13086) CBC-[facility] (57846) [facility]- Est Level  3 (96295)  Problem # 3:  DIAB W/O MENTION COMP TYPE II/UNS TYPE UNCNTRL (ICD-250.02)  Her updated medication list for this problem includes:    Aspir-low 81 Mg Tbec (Aspirin) ..... One daily    Metformin Hcl 500 Mg Tabs (Metformin hcl) .Marland Kitchen..Marland Kitchen Two times a day  Complete Medication List: 1)  Geodon 80 Mg Caps (Ziprasidone hcl) .... Take 3 capsule by mouth once a day 2)  Hydrochlorothiazide 25 Mg Tabs  (Hydrochlorothiazide) .... Take 1/2 tab by mouth every morning 3)  Wellbutrin Sr 150 Mg Tb12 (Bupropion hcl) 4)  Aspir-low 81 Mg Tbec (Aspirin) .... One daily 5)  Clonazepam 1 Mg Tabs (Clonazepam) .... Two times a day 6)  Base D Polyethylene Glycol Powd (Polyethylene glycol 4500) .Marland KitchenMarland Kitchen. 17 gm in 4 oz water two times a day, qs 7)  Metformin Hcl 500 Mg Tabs (Metformin hcl) .... Two times a day 8)  Hydrocortisone 2.5 % Oint (Hydrocortisone) .... Apply to rash bid , 80 gm tube 9)  Omeprazole 20 Mg Cpdr (Omeprazole) .... One daily 10)  Allopurinol 100 Mg Tabs (Allopurinol) .... One tab daily, in 2 weeks increase to 2 tabs daily 11)  Tramadol Hcl 50 Mg Tabs (Tramadol hcl) .... One three times a day as needed pain 12)  Diclofenac Sodium 75 Mg Tbec (Diclofenac sodium) .... One two times a day as needed pain 13)  Bactrim Ds 800-160 Mg Tabs (Sulfamethoxazole-trimethoprim) .... Take two tablets every 12 hours as scheduled.  Patient Instructions: 1)  It was great to see you today. 2)  Please pick up new medication at your pharmacy, [facility] [place]. 3)  Take 2 tablets every 12 hours.  If it upsets your stomach, you can take 1 tablet every 12 hours. 4)  Please schedule an appointment to re-evaulate your foot on Monday morning. 5)  We will call you with the results of your lab work. 6)  Please call MD if symptoms become worse. 7)  Thanks. Prescriptions: BACTRIM DS 800-160 MG TABS (SULFAMETHOXAZOLE-TRIMETHOPRIM) take two tablets every 12 hours as scheduled.  #40 x 0   Entered by:   Ivy de Lawson Radar  MD   Authorized by:   Zachery Dauer MD   Signed by:   Barnabas Lister  MD on 01/19/2010   Method used:   Electronically to        [facility] (retail)       [address]       [place], [place]  [id number]       Ph: [phone]       Fax: [phone]   RxID:   [id number]    Orders Added: 1)  Culture, Wound -[facility] [87070] 2)  A1C-[facility] [83036] 3)  Glucose Cap-[facility] [82948] 4)   Comp Met-[facility] [16606-30160] 5)  CBC-[facility] [85027] 6)  [facility]- Est Level  3 [10932]    Laboratory Results   Blood Tests  Date/Time Received: January 19, 2010 9:24 AM  Date/Time Reported: January 19, 2010 9:55 AM   HGBA1C: 8.4%   (Normal Range: Non-Diabetic - 3-6%   Control Diabetic - 6-8%)  Comments: ...............test performed by......Marland KitchenBonnie A. Swaz[place], MLS (ASCP)cm

## 2010-04-06 NOTE — Miscellaneous (Signed)
  Clinical Lists Changes  Medications: Changed medication from METFORMIN HCL 500 MG TABS (METFORMIN HCL) two times a day to METFORMIN HCL 1000 MG TABS (METFORMIN HCL) one tab two times a day (dosage change) - Signed Rx of METFORMIN HCL 1000 MG TABS (METFORMIN HCL) one tab two times a day (dosage change);  #60 x 6;  Signed;  Entered by: Luretha Murphy NP;  Authorized by: Luretha Murphy NP;  Method used: Electronically to [facility] [address], [place], [place]  [id number], Ph: [phone], Fax: [phone]  Fasting blood sugar was 165, will increase metformin to 1000 mg two times a day for one month then increase SU next month, likely change to glimepride. Luretha Murphy NP  February 22, 2010 8:40 AM   Prescriptions: METFORMIN HCL 1000 MG TABS (METFORMIN HCL) one tab two times a day (dosage change)  #60 x 6   Entered and Authorized by:   Luretha Murphy NP   Signed by:   Luretha Murphy NP on 02/22/2010   Method used:   Electronically to        [facility] (retail)       [address]       [place], [place]  [id number]       Ph: [phone]       Fax: [phone]   RxID:   [id number]

## 2010-04-06 NOTE — Miscellaneous (Signed)
  Clinical Lists Changes  Medications: Changed medication from HYDROCHLOROTHIAZIDE 25 MG  TABS (HYDROCHLOROTHIAZIDE) Take 1 tab by mouth every morning to HYDROCHLOROTHIAZIDE 25 MG  TABS (HYDROCHLOROTHIAZIDE) Take 1 tab by mouth every morning - Signed Rx of HYDROCHLOROTHIAZIDE 25 MG  TABS (HYDROCHLOROTHIAZIDE) Take 1 tab by mouth every morning;  #30 x 6;  Signed;  Entered by: Luretha Murphy NP;  Authorized by: Luretha Murphy NP;  Method used: Electronically to [facility] [address], [place], [place]  [id number], Ph: [phone], Fax: [phone]    Prescriptions: HYDROCHLOROTHIAZIDE 25 MG  TABS (HYDROCHLOROTHIAZIDE) Take 1 tab by mouth every morning  #30 x 6   Entered and Authorized by:   Luretha Murphy NP   Signed by:   Luretha Murphy NP on 02/07/2010   Method used:   Electronically to        [facility] (retail)       [address]       [place], [place]  [address]       Ph: [phone]       Fax: [phone]   RxID:   [id number]

## 2010-04-06 NOTE — Progress Notes (Signed)
Summary: needs meds  Phone Note Call from Patient Call back at Home Phone [phone]   Caller: Patient Summary of Call: wants cough meds [facility]  Follow-up for Phone Call        to pcp for advice or rx Follow-up by: Golden Circle RN,  June 23, 2009 8:44 AM

## 2010-04-06 NOTE — Progress Notes (Signed)
Summary: phn msg  Phone Note Call from Patient Call back at Home Phone [phone]   Caller: Patient Summary of Call: Pt says she is returning Kristin Bruins phone call. Initial call taken by: [name],  March 10, 2009 10:24 AM  Follow-up for Phone Call        called, discussed results. offered physical therapy, discussed weight loss Follow-up by: Luretha Murphy NP,  March 10, 2009 10:56 AM

## 2010-04-06 NOTE — Progress Notes (Signed)
Summary: phn msg  Phone Note Call from Patient Call back at Home Phone [phone]   Caller: Patient Summary of Call: has a question biotin 1000mg  - wants to know if it makes your hair grow Initial call taken by: De Nurse,  April 21, 2009 1:41 PM  Follow-up for Phone Call        wanted to know if it would help grow hair & improve engery. told me that her dermatologist told her to take it. told her I did not see anything on internet about it growing hair. a deficiency can cause hair breakage, but this is unlikely as most people get adequate amounts in their diets. I read her a list of food that have it. she wants to know if she should stop . told her no. if md ordered it, take it. also c/o increase in appeteite. told her i do not see that listed as a side effect. wants to know what treatment S. Humberto Seals wants her to do to grow hair. told her i will send that request to her pcp & have her call her to discuss Follow-up by: Golden Circle RN,  April 21, 2009 1:59 PM

## 2010-04-06 NOTE — Miscellaneous (Signed)
Summary: Gout  Clinical Lists Changes Awaiting patient call to explain plan to reduce uric acid and treat gout./see phone note Luretha Murphy NP  June 07, 2009 1:20 PM     Problems: Added new problem of GOUT, UNSPECIFIED (ICD-274.9) - Signed Medications: Removed medication of IBUPROFEN 800 MG TABS (IBUPROFEN) one three times a day as needed pain - Signed Added new medication of DICLOFENAC SODIUM 75 MG TBEC (DICLOFENAC SODIUM) one tab two times a day - Signed Added new medication of ALLOPURINOL 100 MG TABS (ALLOPURINOL) one tab daily, in 2 weeks increase to 2 tabs daily - Signed Changed medication from HYDROCHLOROTHIAZIDE 25 MG  TABS (HYDROCHLOROTHIAZIDE) Take 1 tab by mouth every morning to HYDROCHLOROTHIAZIDE 25 MG  TABS (HYDROCHLOROTHIAZIDE) Take 1/2 tab by mouth every morning - Signed Rx of DICLOFENAC SODIUM 75 MG TBEC (DICLOFENAC SODIUM) one tab two times a day;  #60 x 6;  Signed;  Entered by: Luretha Murphy NP;  Authorized by: Luretha Murphy NP;  Method used: Electronically to [facility] [address], [place], [place]  [id number], Ph: [phone], Fax: [phone] Rx of ALLOPURINOL 100 MG TABS (ALLOPURINOL) one tab daily, in 2 weeks increase to 2 tabs daily;  #60 x 6;  Signed;  Entered by: Luretha Murphy NP;  Authorized by: Luretha Murphy NP;  Method used: Electronically to [facility] [address], [place], [place]  [id number], Ph: [phone], Fax: [phone]    Prescriptions: ALLOPURINOL 100 MG TABS (ALLOPURINOL) one tab daily, in 2 weeks increase to 2 tabs daily  #60 x 6   Entered and Authorized by:   Luretha Murphy NP   Signed by:   Luretha Murphy NP on 06/07/2009   Method used:   Electronically to        [facility] (retail)       [address]       [place], [place]  [address]       Ph: [phone]       Fax: [phone]   RxID:   [id number] DICLOFENAC SODIUM 75 MG TBEC (DICLOFENAC SODIUM) one tab two  times a day  #60 x 6   Entered and Authorized by:   Luretha Murphy NP   Signed by:   Luretha Murphy NP on 06/07/2009   Method used:   Electronically to        [facility] (retail)       [address]       [place], [place]  [address]       Ph: [phone]       Fax: [phone]   RxID:   [id number]

## 2010-04-06 NOTE — Progress Notes (Signed)
Summary: triage  Phone Note Call from Patient Call back at Home Phone [phone]   Caller: Patient Summary of Call: Pt asking to speak to [facility] again. Initial call taken by: [name],  June 08, 2009 4:37 PM  Follow-up for Phone Call        to her pcp Follow-up by: Golden Circle RN,  June 08, 2009 4:41 PM  Additional Follow-up for Phone Call Additional follow up Details #1::        reviewed her meds again. told her what pcp wrote about the diclofenac. gave her the dates to stop the diclofenac & when to start 2 pills of allopurinol Additional Follow-up by: Golden Circle RN,  June 08, 2009 4:42 PM

## 2010-04-06 NOTE — Miscellaneous (Signed)
Summary: Xray reports to EMR  Clinical Lists Changes  Observations: Added new observation of PAST MED HX: ? 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 Right Hip film: Mild to moderate degenerative changes at the right hip for age. No acute osseous abnormality identified. (03/10/2009 9:15) Added new observation of OTHER X-RAY: Mild to moderate degenerative changes at the right hip for age. No acute osseous abnormality identified. (03/03/2009 9:18) Added new observation of OTHER X-RAY: Evidence of chronic C5-C6 and C6-C7 disc degeneration. 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 (03/03/2009 9:16)      X-ray Musculoskeletal  Procedure date:  03/03/2009  Findings:      Evidence of chronic C5-C6 and C6-C7 disc degeneration. 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  X-ray Musculoskeletal  Procedure date:  03/03/2009  Findings:      Mild to moderate degenerative changes at the right hip for age. No acute osseous abnormality identified.    Past Medical History:    ? 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    Right Hip film: Mild to moderate degenerative changes at the right hip for age. No    acute osseous abnormality  identified.    X-ray Musculoskeletal  Procedure date:  03/03/2009  Findings:      Evidence of chronic C5-C6 and C6-C7 disc degeneration. 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  X-ray Musculoskeletal  Procedure date:  03/03/2009  Findings:      Mild to moderate degenerative changes at the right hip for age. No acute osseous abnormality identified.

## 2010-04-06 NOTE — Progress Notes (Signed)
Summary: meds prob  Phone Note Call from Patient Call back at Home Phone [phone]   Caller: Patient Summary of Call: states that the metformin is too strong and is causing her to be shakey - wants to reduce it Initial call taken by: De Nurse,  February 23, 2010 1:36 PM  Follow-up for Phone Call        may reduce back to 500 mg feels that she is shaky and nervous and it is too strong for her.  She is to return next month. Follow-up by: Luretha Murphy NP,  February 23, 2010 1:44 PM

## 2010-04-06 NOTE — Progress Notes (Signed)
Summary: triage  Phone Note Call from Patient Call back at Home Phone [phone]   Caller: Patient Summary of Call: Pt having side effects from medication given to her for hot flashes.  Has tried taking it at different times during the day with no help. Initial call taken by: [name],  October 19, 2009 9:17 AM  Follow-up for Phone Call        appt made to see pcp. states she is not going to take the med Follow-up by: Golden Circle RN,  October 19, 2009 9:24 AM

## 2010-04-12 ENCOUNTER — Other Ambulatory Visit: Payer: Self-pay | Admitting: Internal Medicine

## 2010-04-12 DIAGNOSIS — Z1231 Encounter for screening mammogram for malignant neoplasm of breast: Secondary | ICD-10-CM

## 2010-04-30 ENCOUNTER — Other Ambulatory Visit: Payer: Self-pay | Admitting: Family Medicine

## 2010-04-30 NOTE — Telephone Encounter (Signed)
Refill request

## 2010-05-05 ENCOUNTER — Other Ambulatory Visit: Payer: Self-pay | Admitting: Family Medicine

## 2010-05-05 NOTE — Telephone Encounter (Signed)
Please review and refill

## 2010-05-11 ENCOUNTER — Other Ambulatory Visit: Payer: Self-pay | Admitting: Obstetrics and Gynecology

## 2010-05-22 ENCOUNTER — Ambulatory Visit
Admission: RE | Admit: 2010-05-22 | Discharge: 2010-05-22 | Disposition: A | Discharge status: Home / Self Care | Payer: Medicaid Other | Source: Ambulatory Visit | Attending: Internal Medicine | Admitting: Internal Medicine

## 2010-05-22 DIAGNOSIS — Z1231 Encounter for screening mammogram for malignant neoplasm of breast: Secondary | ICD-10-CM

## 2010-05-26 ENCOUNTER — Other Ambulatory Visit: Payer: Self-pay | Admitting: Family Medicine

## 2010-05-26 NOTE — Telephone Encounter (Signed)
Refill request

## 2010-06-02 ENCOUNTER — Emergency Department (HOSPITAL_COMMUNITY)
Admission: EM | Admit: 2010-06-02 | Discharge: 2010-06-02 | Disposition: A | Discharge status: Home / Self Care | Payer: Medicare Other | Attending: Emergency Medicine | Admitting: Emergency Medicine

## 2010-06-02 ENCOUNTER — Emergency Department (HOSPITAL_COMMUNITY): Payer: Medicare Other

## 2010-06-02 ENCOUNTER — Inpatient Hospital Stay (INDEPENDENT_AMBULATORY_CARE_PROVIDER_SITE_OTHER)
Admission: RE | Admit: 2010-06-02 | Discharge: 2010-06-02 | Disposition: A | Discharge status: Home / Self Care | Payer: Medicare Other | Source: Ambulatory Visit | Attending: Emergency Medicine | Admitting: Emergency Medicine

## 2010-06-02 DIAGNOSIS — I10 Essential (primary) hypertension: Secondary | ICD-10-CM | POA: Insufficient documentation

## 2010-06-02 DIAGNOSIS — E119 Type 2 diabetes mellitus without complications: Secondary | ICD-10-CM | POA: Insufficient documentation

## 2010-06-02 DIAGNOSIS — R1013 Epigastric pain: Secondary | ICD-10-CM | POA: Insufficient documentation

## 2010-06-02 DIAGNOSIS — R109 Unspecified abdominal pain: Secondary | ICD-10-CM

## 2010-06-02 DIAGNOSIS — N390 Urinary tract infection, site not specified: Secondary | ICD-10-CM | POA: Insufficient documentation

## 2010-06-02 DIAGNOSIS — R110 Nausea: Secondary | ICD-10-CM | POA: Insufficient documentation

## 2010-06-02 DIAGNOSIS — Z8659 Personal history of other mental and behavioral disorders: Secondary | ICD-10-CM | POA: Insufficient documentation

## 2010-06-02 DIAGNOSIS — Z79899 Other long term (current) drug therapy: Secondary | ICD-10-CM | POA: Insufficient documentation

## 2010-06-02 DIAGNOSIS — K219 Gastro-esophageal reflux disease without esophagitis: Secondary | ICD-10-CM | POA: Insufficient documentation

## 2010-06-02 LAB — URINALYSIS, ROUTINE W REFLEX MICROSCOPIC
Glucose, UA: NEGATIVE mg/dL
Protein, ur: >300 mg/dL — AB
Specific Gravity, Urine: 1.031 — ABNORMAL HIGH (ref 1.005–1.030)
Urobilinogen, UA: 1 mg/dL (ref 0.0–1.0)

## 2010-06-02 LAB — CBC
MCH: 29.8 pg (ref 26.0–34.0)
MCHC: 35 g/dL (ref 30.0–36.0)
MCV: 85.1 fL (ref 78.0–100.0)
Platelets: 324 10*3/uL (ref 150–400)
RBC: 4.9 MIL/uL (ref 3.87–5.11)
RDW: 13.7 % (ref 11.5–15.5)

## 2010-06-02 LAB — COMPREHENSIVE METABOLIC PANEL
AST: 31 U/L (ref 0–37)
Albumin: 4 g/dL (ref 3.5–5.2)
BUN: 11 mg/dL (ref 6–23)
Calcium: 9.1 mg/dL (ref 8.4–10.5)
Creatinine, Ser: 0.7 mg/dL (ref 0.4–1.2)
GFR calc Af Amer: >60 mL/min (ref >60)
Total Protein: 7.3 g/dL (ref 6.0–8.3)

## 2010-06-02 LAB — DIFFERENTIAL
Basophils Relative: 1 % (ref 0–1)
Eosinophils Relative: 3 % (ref 0–5)
Lymphocytes Relative: 43 % (ref 12–46)
Monocytes Relative: 7 % (ref 3–12)
Neutrophils Relative %: 46 % (ref 43–77)

## 2010-06-02 LAB — URINE MICROSCOPIC-ADD ON

## 2010-06-04 LAB — URINE CULTURE: Culture  Setup Time: 201203301740

## 2010-06-12 ENCOUNTER — Other Ambulatory Visit: Payer: Self-pay | Admitting: Obstetrics and Gynecology

## 2010-06-12 ENCOUNTER — Ambulatory Visit (HOSPITAL_COMMUNITY)
Admission: RE | Admit: 2010-06-12 | Discharge: 2010-06-12 | Disposition: A | Discharge status: Home / Self Care | Payer: Medicare Other | Source: Ambulatory Visit | Attending: Obstetrics and Gynecology | Admitting: Obstetrics and Gynecology

## 2010-06-12 DIAGNOSIS — E119 Type 2 diabetes mellitus without complications: Secondary | ICD-10-CM | POA: Insufficient documentation

## 2010-06-12 DIAGNOSIS — I10 Essential (primary) hypertension: Secondary | ICD-10-CM | POA: Insufficient documentation

## 2010-06-12 DIAGNOSIS — N950 Postmenopausal bleeding: Secondary | ICD-10-CM | POA: Insufficient documentation

## 2010-06-12 DIAGNOSIS — N840 Polyp of corpus uteri: Secondary | ICD-10-CM | POA: Insufficient documentation

## 2010-06-12 LAB — CBC
MCV: 87.8 fL (ref 78.0–100.0)
Platelets: 306 10*3/uL (ref 150–400)
RBC: 4.75 MIL/uL (ref 3.87–5.11)
RDW: 14.4 % (ref 11.5–15.5)
WBC: 7.6 10*3/uL (ref 4.0–10.5)

## 2010-06-12 LAB — GLUCOSE, CAPILLARY: Glucose-Capillary: 130 mg/dL — ABNORMAL HIGH (ref 70–99)

## 2010-06-21 NOTE — Op Note (Signed)
NAMEELIVIA, Jacqueline Reese           ACCOUNT NO.:  [id number]  MEDICAL RECORD NO.:  [id number]           PATIENT TYPE:  O  LOCATION:  [facility]                          FACILITY:  [facility]  PHYSICIAN:  Patsy Baltimore, MD     DATE OF BIRTH:  Jan 08, 1956  DATE OF PROCEDURE:  06/12/2010 DATE OF DISCHARGE:                              OPERATIVE REPORT   PREOPERATIVE DIAGNOSIS:  Postmenopausal bleeding.  POSTOPERATIVE DIAGNOSIS:  Uterine polyp.  PROCEDURES PERFORMED: 1. Hysteroscopy. 2. Polypectomy. 3. Dilation and curettage.  SURGEON:  Patsy Baltimore, MD  ANESTHESIA:  General.  FINDINGS:  Uterine polyps.  SPECIMENS SENT:  Endometrial polyps and curettings.  Specimen was sent to pathology.  ESTIMATED BLOOD LOSS:  Minimal.  FLUID DEFICIT:  540 mL with a pool of approximately 150 mL that leaked onto the floor.  Jacqueline Reese is a 55 year old para 0 who was seen in the office for irregular bleeding.  Her last menstrual period was 3 years ago.  She had an endometrial biopsy done that was negative.  However, her ultrasound demonstrated a thickened endometrial lining, it measured 1 cm.  Therefore, informed consent was obtained from the patient for hysteroscopy with D and C and possible polypectomy.  She was seen by her medical doctor and cleared for the surgery.  She stopped taking her aspirin 2 weeks prior to the surgery.  On the day of surgery, she was on her menstrual cycle.  She reiterated her informed consent.  She was taken to the operating room with IV fluids running.  She was put under general anesthesia without issue.  Her legs were then lifted up to the dorsal lithotomy position.  She was prepped and draped in the usual sterile fashion.  Time-out was called, and I began the procedure.  Her bladder was emptied with a red rubber catheter.  A one-sided speculum was inserted into the vagina.  The anterior lip of the cervix was grasped with a single-toothed tenaculum.   However, this kept on slipping off throughout the case and it was subsequently regrasped with a Jacobs tenaculum.  She has a very deep vagina.  It was actually easier to access the uterine cavity by grasping onto the posterior lip. Nevertheless, initial hysteroscopy revealed uterine polyps emanating from the fundal region of her uterus.  I tried using the resectoscope to remove the polyps for about 10 minutes; however, due to the size and limited visualization, I abandoned the hysteroscope and just used the Schweizer forceps to grasp the polyps.  There were three polyps removed. The specimen was sent to pathology, one of them was about 3 cm x 1 cm, the other ones were approximately a centimeter each.  The rest of the uterine lining was relatively thin.  Once the polyps were removed, both tubal ostia were visualized.  The uterine cavity was cleaned.  All the instruments were removed from the vagina.  The posterior lip of the cervix had some bleeding due to the tenaculum.  I put a figure-of-eight stitch using 2-0 Vicryl suture.  It was hemostatic.  All instruments were then removed.  The patient was reawakened and brought to the  PACU.  All sponge and instrument counts were correct.          ______________________________ Patsy Baltimore, MD     CO/MEDQ  D:  06/12/2010  T:  06/13/2010  Job:  [id number]  Electronically Signed by Patsy Baltimore MD on 06/21/2010 01:14:02 PM

## 2010-07-21 NOTE — H&P (Signed)
[facility]  Patient:    Jacqueline, Reese Visit Number: [id number] MRN: [id number]          Service Type: EMS Location: [facility] Attending Physician:  Osvaldo Human Dictated by:   Young Berry Lorin Picket, R.N., F.N.P. Adm. Date:  10/21/2000 Disc. Date: 10/21/2000                     Psychiatric Admission Assessment  DATE OF ADMISSION:  September 19, 2000  PATIENT IDENTIFICATION:  This is a 55 year old African-American female who is married.  This is a voluntary admission.  HISTORY OF PRESENT ILLNESS:  The patient reports, "I was tired of hearing the voices bothering me all of the time.  They are a burden and I was feeling sick."  The patient reports having auditory or visual hallucinations since her Geodon dose was decreased two weeks ago from 40 mg b.i.d. to 20 mg b.i.d.  The patient says she was having lip twitching on the increased dose.  She states the voices are telling her she is bad and that she is going to die.  She has been seeing peoples faces, especially the face of her deceased sister.  She denies any suicidal ideation or homicidal ideation.  PAST PSYCHIATRIC HISTORY:  The patient has been followed by Dr. Donell Beers for the past five years with her next appointment due October 08, 2000.  She also sees Equ[employer] who is her psychotherapist and she denies any history of previous inpatient treatment.  SUBSTANCE ABUSE HISTORY:  The patient denies any use of alcohol or illegal drugs.  She smokes two packs per day for the past 20 years of cigarettes.  PAST MEDICAL HISTORY:  The patient primary care Gal Feldhaus is Dr. Joetta Manners at [facility]; phone number [phone] extension [phone].  Medical problems are acute ear, she currently has a left otitis media.  MEDICATIONS: 1. Nicotrol inhaler. 2. HCTZ 25 mg q.d. 3. Zoloft 200 mg q.a.m. 4. Geodon 20 mg b.i.d. 5. Temazepam dose unknown at h.s. 6. She is currently taking a Z-Pak for  her otitis. 7. She was previously on Serevent but that has been discontinued. 8. The patient was also taking some type of opiate cough medicine, she thinks    possibly, but that is unclear.  DRUG ALLERGIES:  None.  PHYSICAL EXAMINATION:  GENERAL:  The patients physical examination was done at [facility].  LABORATORY DATA:  Urine drug screen was positive for benzodiazepines and opiates, which we believe may be due to the cough medicine she was taking. Hematocrit was at 50%, hemoglobin 17.  Urine pregnancy test was negative. Creatinine 1.0.  SOCIAL HISTORY:  The patient was married in February 2002 and the couple is living with the patients mother in the patients mothers house.  The patient wants to move away from the mother but cannot afford to do so.  This is her first marriage.  She has one 108-year-old son named Jacqueline Reese.  The patient is on disability for her schizophrenia and her husband is partially blind and also in disability.  MENTAL STATUS EXAMINATION:  This is an alert and cooperative female who is polite with a bright affect.  Affect is appropriate.  She is casually dressed and groomed.  Speech is relevant, spontaneous, and normal.  Mood is fairly euthymic.  Thought process is logical and coherent.  She is positive for some visual hallucinations and auditory hallucinations, no evidence of suicidal or homicidal ideations.  Cognitive:  Intact.  Intelligence: Below average. Judgment is fair.  Impulse control: Below average.  Judgement is fair. Impulse control: Average.  ADMISSION DIAGNOSES: Axis I:    1. Schizophrenia, not otherwise specified, acute exacerbation.            2. Rule out anxiety disorder by history. Axis II:   Deferred. Axis III:  1. Acute otitis media, left.            2. Tobacco abuse. Axis IV:   Moderate problems with her primary support group with stress with a            new husband and living with her mother. Axis V:    Current  36, past year 57.  INITIAL PLAN OF CARE:  Plan is to admit the patient to stabilize her thinking and improve her reality testing and decrease her psychotic symptoms.  We have increased her Geodon to 20 mg q.a.m. and 40 mg at h.s.  We will decrease her Zoloft to 150 mg p.o. daily.  We will phone her primary care M.D. to clarify her medications and we will order a copy of her physical examination from [facility].  The case manager will assist in contacting the patients mother about any concerns and the level of support there.  We will resume her routine medications including her Z-Pak for her otitis.  ESTIMATED LENGTH OF STAY:  Four to five days. Dictated by:   Young Berry Lorin Picket, R.N., F.N.P. Attending Physician:  Osvaldo Human DD:  10/24/00 TD:  10/28/00 Job: [phone] [id number]

## 2010-07-21 NOTE — Discharge Summary (Signed)
[facility]  Patient:    Jacqueline Reese, Jacqueline Reese Visit Number: [id number] MRN: [id number]          Service Type: EMS Location: [facility] Attending Physician:  Osvaldo Human Adm. Date:  10/21/2000 Disc. Date: 10/21/2000                             Discharge Summary  INTRODUCTION:  Jacqueline Reese is a 55 year old black female who was admitted voluntarily because of hearing voices.  She has been followed for past 5 years by Dr. Donell Beers and treated with Geodon, temazepam and Zoloft.  She has been diagnosed in the past suffering from schizophrenia.  The voices mostly simply started telling patient that she is going to die and she is frightened that somebody is going to hurt her.  MEDICAL PROBLEMS:  Patient has recent history of left otitis.  Otherwise, she is medically healthy.  PHYSICAL EXAMINATION:  Done at [facility] Emergency Room was normal.  HOSPITAL COURSE:  After being admitted to the ward, patient was placed on special observation.  She was started on Geodon 20 mg daily, Zoloft 200 mg daily, Zithromax for symptoms of otitis, and hydrochlorothiazide 25 mg daily. She also was started on Advair inhaler twice a day.  Because of existing hallucinations, Geodon order was quickly changed from 20 mg daily to 20 mg in the morning and 40 at night.  Vital signs remained stable, with blood pressure 120/73, normal pulse, respiration rate and temperature.  On July 19, family session took place and medication and status of patients illness were reviewed.  It seems like patient has a good family support and social support in form of her therapy and church.  On July 20, much better with hallucinations.  Denies during the day but at night when she tries to fall asleep, voices are getting worse.  No side effects from medication were observed.  No suicidal or homicidal ideations.  I increased Vistoril and changed timing on Geodon, hoping that the current dose  will be sufficient in the near future.  On July 21, the social worker was able to speak to patients mother who felt that she was doing well, and that she could go home as soon as possible.  On July 21 later on, after meeting the patient I felt that she was doing very well, free from hallucinations, delusions and dangerous ideation. Bright, pleasant affect.  Good sleep.  Tolerates medication well.  I felt that she was stable enough to be discharged for further care of Dr. Donell Beers.  REVIEW OF LABORATORY WORK:  Negative pregnancy test, normal urinalysis, and metabolic profile in the emergency room showed normal glucose, BUN, creatinine electrolytes and hemoglobin and hematocrit.  DISCHARGE DIAGNOSES: Axis I:    Schizophrenia, chronic, primary type, exacerbated. Axis II:   Diagnosis deferred. Axis III:  Acute otitis, treated. Axis IV:   Moderate stressors, problems with support group and medical            problems. Axis V:    Global assessment of function upon admission 35, maximum for past            year 70, upon discharge 60.  DISCHARGE RECOMMENDATIONS:  Diet should include low calories and drinking orange or tomato juice to provide potassium supplement since patient is on hydrochlorothiazide.  Patient should call if problems with medication or recurrence of symptoms.  She has appointment with Tonette Lederer on July 23 in the  morning and will make herself an appointment with Dr. Donell Beers within next 2 weeks.  Patient understood the instructions and in good condition was discharged home.  DISCHARGE MEDICATIONS: 1. Ativan 1 mg 3 times a day. 2. Geodon 20 mg in the morning and 40 at bedtime. 3. Zoloft 100 mg 1-1/2 tablet daily, for total dose of 150 mg daily. 4. Hydrochlorothiazide 25 mg once a day. 5. Atrovent, Advair, Humibid, and Flonase nasal spray as advised by her    family physician. Attending Physician:  Osvaldo Human DD:  10/23/00 TD:  10/24/00 Job: [id number]

## 2010-08-17 ENCOUNTER — Other Ambulatory Visit: Payer: Self-pay | Admitting: Family Medicine

## 2010-08-17 NOTE — Telephone Encounter (Signed)
Refill request

## 2010-11-20 ENCOUNTER — Other Ambulatory Visit: Payer: Self-pay | Admitting: Family Medicine

## 2010-11-20 NOTE — Telephone Encounter (Signed)
Refill request

## 2010-11-29 LAB — POCT URINALYSIS DIP (DEVICE)
Glucose, UA: NEGATIVE
Nitrite: NEGATIVE
Operator id: 282151
Protein, ur: NEGATIVE
Urobilinogen, UA: 0.2

## 2010-11-29 LAB — POCT I-STAT, CHEM 8
HCT: 41
Hemoglobin: 13.9
Potassium: 3.8
Sodium: 139
TCO2: 27

## 2010-12-09 ENCOUNTER — Inpatient Hospital Stay (INDEPENDENT_AMBULATORY_CARE_PROVIDER_SITE_OTHER)
Admission: RE | Admit: 2010-12-09 | Discharge: 2010-12-09 | Disposition: A | Discharge status: Home / Self Care | Payer: Medicare Other | Source: Ambulatory Visit | Attending: Family Medicine | Admitting: Family Medicine

## 2010-12-09 ENCOUNTER — Ambulatory Visit (INDEPENDENT_AMBULATORY_CARE_PROVIDER_SITE_OTHER): Payer: Medicare Other

## 2010-12-09 DIAGNOSIS — M79609 Pain in unspecified limb: Secondary | ICD-10-CM

## 2010-12-09 DIAGNOSIS — N760 Acute vaginitis: Secondary | ICD-10-CM

## 2010-12-09 LAB — POCT URINALYSIS DIP (DEVICE)
Bilirubin Urine: NEGATIVE
Glucose, UA: NEGATIVE mg/dL
Ketones, ur: NEGATIVE mg/dL
Leukocytes, UA: NEGATIVE
Nitrite: NEGATIVE
pH: 5 (ref 5.0–8.0)

## 2010-12-12 LAB — POCT PREGNANCY, URINE: Preg Test, Ur: NEGATIVE

## 2010-12-12 LAB — POCT URINALYSIS DIP (DEVICE)
Bilirubin Urine: NEGATIVE
Glucose, UA: NEGATIVE
Nitrite: NEGATIVE
Operator id: 200941
pH: 6.5

## 2011-01-06 ENCOUNTER — Other Ambulatory Visit: Payer: Self-pay | Admitting: Family Medicine

## 2011-01-08 ENCOUNTER — Other Ambulatory Visit: Payer: Self-pay | Admitting: Family Medicine

## 2011-01-08 NOTE — Telephone Encounter (Signed)
Message left on voicemail to call office. Needs appointment for refill on generic voltaren.

## 2011-01-15 ENCOUNTER — Other Ambulatory Visit: Payer: Self-pay | Admitting: Family Medicine

## 2011-03-09 DIAGNOSIS — E119 Type 2 diabetes mellitus without complications: Secondary | ICD-10-CM | POA: Diagnosis not present

## 2011-03-09 DIAGNOSIS — I10 Essential (primary) hypertension: Secondary | ICD-10-CM | POA: Diagnosis not present

## 2011-03-09 DIAGNOSIS — K219 Gastro-esophageal reflux disease without esophagitis: Secondary | ICD-10-CM | POA: Diagnosis not present

## 2011-03-09 DIAGNOSIS — F17200 Nicotine dependence, unspecified, uncomplicated: Secondary | ICD-10-CM | POA: Diagnosis not present

## 2011-03-09 DIAGNOSIS — E669 Obesity, unspecified: Secondary | ICD-10-CM | POA: Diagnosis not present

## 2011-03-09 DIAGNOSIS — M169 Osteoarthritis of hip, unspecified: Secondary | ICD-10-CM | POA: Diagnosis not present

## 2011-03-29 ENCOUNTER — Other Ambulatory Visit: Payer: Self-pay | Admitting: Internal Medicine

## 2011-03-29 DIAGNOSIS — Z1231 Encounter for screening mammogram for malignant neoplasm of breast: Secondary | ICD-10-CM

## 2011-04-04 DIAGNOSIS — E119 Type 2 diabetes mellitus without complications: Secondary | ICD-10-CM | POA: Diagnosis not present

## 2011-05-04 DIAGNOSIS — L538 Other specified erythematous conditions: Secondary | ICD-10-CM | POA: Diagnosis not present

## 2011-05-04 DIAGNOSIS — N898 Other specified noninflammatory disorders of vagina: Secondary | ICD-10-CM | POA: Diagnosis not present

## 2011-05-18 DIAGNOSIS — M79609 Pain in unspecified limb: Secondary | ICD-10-CM | POA: Diagnosis not present

## 2011-05-18 DIAGNOSIS — B351 Tinea unguium: Secondary | ICD-10-CM | POA: Diagnosis not present

## 2011-05-23 ENCOUNTER — Ambulatory Visit
Admission: RE | Admit: 2011-05-23 | Discharge: 2011-05-23 | Disposition: A | Discharge status: Home / Self Care | Payer: Medicare Other | Source: Ambulatory Visit | Attending: Internal Medicine | Admitting: Internal Medicine

## 2011-05-23 ENCOUNTER — Ambulatory Visit: Payer: Medicare Other

## 2011-05-23 DIAGNOSIS — Z1231 Encounter for screening mammogram for malignant neoplasm of breast: Secondary | ICD-10-CM

## 2011-05-25 ENCOUNTER — Other Ambulatory Visit: Payer: Self-pay | Admitting: Internal Medicine

## 2011-05-25 DIAGNOSIS — R928 Other abnormal and inconclusive findings on diagnostic imaging of breast: Secondary | ICD-10-CM

## 2011-05-29 ENCOUNTER — Ambulatory Visit
Admission: RE | Admit: 2011-05-29 | Discharge: 2011-05-29 | Disposition: A | Discharge status: Home / Self Care | Payer: Medicare Other | Source: Ambulatory Visit | Attending: Internal Medicine | Admitting: Internal Medicine

## 2011-05-29 DIAGNOSIS — R928 Other abnormal and inconclusive findings on diagnostic imaging of breast: Secondary | ICD-10-CM | POA: Diagnosis not present

## 2011-05-31 DIAGNOSIS — F259 Schizoaffective disorder, unspecified: Secondary | ICD-10-CM | POA: Diagnosis not present

## 2011-06-08 DIAGNOSIS — F17200 Nicotine dependence, unspecified, uncomplicated: Secondary | ICD-10-CM | POA: Diagnosis not present

## 2011-06-08 DIAGNOSIS — E119 Type 2 diabetes mellitus without complications: Secondary | ICD-10-CM | POA: Diagnosis not present

## 2011-06-08 DIAGNOSIS — I10 Essential (primary) hypertension: Secondary | ICD-10-CM | POA: Diagnosis not present

## 2011-06-08 DIAGNOSIS — L259 Unspecified contact dermatitis, unspecified cause: Secondary | ICD-10-CM | POA: Diagnosis not present

## 2011-06-08 DIAGNOSIS — E669 Obesity, unspecified: Secondary | ICD-10-CM | POA: Diagnosis not present

## 2011-08-31 DIAGNOSIS — B351 Tinea unguium: Secondary | ICD-10-CM | POA: Diagnosis not present

## 2011-08-31 DIAGNOSIS — M79609 Pain in unspecified limb: Secondary | ICD-10-CM | POA: Diagnosis not present

## 2011-10-09 DIAGNOSIS — F259 Schizoaffective disorder, unspecified: Secondary | ICD-10-CM | POA: Diagnosis not present

## 2011-10-18 DIAGNOSIS — M25569 Pain in unspecified knee: Secondary | ICD-10-CM | POA: Diagnosis not present

## 2011-10-18 DIAGNOSIS — S8000XA Contusion of unspecified knee, initial encounter: Secondary | ICD-10-CM | POA: Diagnosis not present

## 2011-10-18 DIAGNOSIS — M25469 Effusion, unspecified knee: Secondary | ICD-10-CM | POA: Diagnosis not present

## 2011-11-02 DIAGNOSIS — Z23 Encounter for immunization: Secondary | ICD-10-CM | POA: Diagnosis not present

## 2011-11-08 DIAGNOSIS — M25559 Pain in unspecified hip: Secondary | ICD-10-CM | POA: Diagnosis not present

## 2011-11-08 DIAGNOSIS — M169 Osteoarthritis of hip, unspecified: Secondary | ICD-10-CM | POA: Diagnosis not present

## 2011-11-09 DIAGNOSIS — B351 Tinea unguium: Secondary | ICD-10-CM | POA: Diagnosis not present

## 2011-11-09 DIAGNOSIS — M79609 Pain in unspecified limb: Secondary | ICD-10-CM | POA: Diagnosis not present

## 2011-11-29 DIAGNOSIS — F17200 Nicotine dependence, unspecified, uncomplicated: Secondary | ICD-10-CM | POA: Diagnosis not present

## 2011-11-29 DIAGNOSIS — L089 Local infection of the skin and subcutaneous tissue, unspecified: Secondary | ICD-10-CM | POA: Diagnosis not present

## 2011-11-29 DIAGNOSIS — E119 Type 2 diabetes mellitus without complications: Secondary | ICD-10-CM | POA: Diagnosis not present

## 2011-11-29 DIAGNOSIS — E669 Obesity, unspecified: Secondary | ICD-10-CM | POA: Diagnosis not present

## 2011-11-29 DIAGNOSIS — Z5181 Encounter for therapeutic drug level monitoring: Secondary | ICD-10-CM | POA: Diagnosis not present

## 2011-11-29 DIAGNOSIS — I10 Essential (primary) hypertension: Secondary | ICD-10-CM | POA: Diagnosis not present

## 2011-12-12 DIAGNOSIS — E119 Type 2 diabetes mellitus without complications: Secondary | ICD-10-CM | POA: Diagnosis not present

## 2012-01-25 DIAGNOSIS — M79609 Pain in unspecified limb: Secondary | ICD-10-CM | POA: Diagnosis not present

## 2012-01-25 DIAGNOSIS — B351 Tinea unguium: Secondary | ICD-10-CM | POA: Diagnosis not present

## 2012-02-18 DIAGNOSIS — R300 Dysuria: Secondary | ICD-10-CM | POA: Diagnosis not present

## 2012-02-19 DIAGNOSIS — L299 Pruritus, unspecified: Secondary | ICD-10-CM | POA: Diagnosis not present

## 2012-03-02 ENCOUNTER — Emergency Department (HOSPITAL_COMMUNITY): Payer: Medicare Other

## 2012-03-02 ENCOUNTER — Encounter (HOSPITAL_COMMUNITY): Payer: Self-pay

## 2012-03-02 ENCOUNTER — Emergency Department (HOSPITAL_COMMUNITY)
Admission: EM | Admit: 2012-03-02 | Discharge: 2012-03-02 | Disposition: A | Discharge status: Home / Self Care | Payer: Medicare Other | Attending: Emergency Medicine | Admitting: Emergency Medicine

## 2012-03-02 DIAGNOSIS — J029 Acute pharyngitis, unspecified: Secondary | ICD-10-CM | POA: Insufficient documentation

## 2012-03-02 DIAGNOSIS — R05 Cough: Secondary | ICD-10-CM | POA: Insufficient documentation

## 2012-03-02 DIAGNOSIS — E119 Type 2 diabetes mellitus without complications: Secondary | ICD-10-CM | POA: Diagnosis not present

## 2012-03-02 DIAGNOSIS — F17200 Nicotine dependence, unspecified, uncomplicated: Secondary | ICD-10-CM | POA: Diagnosis not present

## 2012-03-02 DIAGNOSIS — Z79899 Other long term (current) drug therapy: Secondary | ICD-10-CM | POA: Diagnosis not present

## 2012-03-02 DIAGNOSIS — F209 Schizophrenia, unspecified: Secondary | ICD-10-CM | POA: Diagnosis not present

## 2012-03-02 DIAGNOSIS — R6883 Chills (without fever): Secondary | ICD-10-CM | POA: Insufficient documentation

## 2012-03-02 DIAGNOSIS — R059 Cough, unspecified: Secondary | ICD-10-CM | POA: Diagnosis not present

## 2012-03-02 DIAGNOSIS — J069 Acute upper respiratory infection, unspecified: Secondary | ICD-10-CM | POA: Diagnosis not present

## 2012-03-02 DIAGNOSIS — R197 Diarrhea, unspecified: Secondary | ICD-10-CM | POA: Diagnosis not present

## 2012-03-02 DIAGNOSIS — J3489 Other specified disorders of nose and nasal sinuses: Secondary | ICD-10-CM | POA: Diagnosis not present

## 2012-03-02 DIAGNOSIS — Z7982 Long term (current) use of aspirin: Secondary | ICD-10-CM | POA: Diagnosis not present

## 2012-03-02 HISTORY — DX: Schizophrenia, unspecified: F20.9

## 2012-03-02 HISTORY — DX: Type 2 diabetes mellitus without complications: E11.9

## 2012-03-02 MED ORDER — HYDROCODONE-ACETAMINOPHEN 5-325 MG PO TABS: 1.0000 | ORAL_TABLET | Freq: Four times a day (QID) | ORAL | Status: DC | PRN

## 2012-03-02 MED ORDER — GUAIFENESIN 100 MG/5ML PO SYRP: 100.0000 mg | ORAL_SOLUTION | ORAL | Status: DC | PRN

## 2012-03-02 NOTE — ED Notes (Signed)
Pt reports nasal congestion, dry cough, and chills starting Wednesday

## 2012-03-02 NOTE — ED Provider Notes (Signed)
History    This chart was scribed for [employer]. Rubin Payor, MD, MD by Smitty Pluck, ED Scribe. The patient was seen in room TR08C/TR08C and the patient's care was started at 11:07AM.   CSN: [id number]  Arrival date & time 03/02/12  4098      Chief Complaint  Patient presents with  . Nasal Congestion    (Consider location/radiation/quality/duration/timing/severity/associated sxs/prior treatment) The history is provided by the patient. No language interpreter was used.   Jacqueline Reese is a 56 y.o. female who presents to the Emergency Department complaining of constant, moderate nasal congestion onset 4 days ago. Pt reports that she has non-productive cough and chills. She reports that she had sore throat and mild diarrhea. Pt denies nausea, vomiting, fever, chills and any other symptoms. Pt reports that she smokes cigarettes (pack/3 days).   Past Medical History  Diagnosis Date  . Diabetes mellitus without complication   . Schizophrenic disorder     History reviewed. No pertinent past surgical history.  History reviewed. No pertinent family history.  History  Substance Use Topics  . Smoking status: Current Every Day Smoker -- 0.5 packs/day    Types: Cigarettes  . Smokeless tobacco: Not on file  . Alcohol Use: No    OB History    Grav Para Term Preterm Abortions TAB SAB Ect Mult Living                  Review of Systems  Constitutional: Negative for fever and chills.  HENT: Positive for congestion.   Respiratory: Positive for cough. Negative for shortness of breath.   Gastrointestinal: Positive for diarrhea. Negative for nausea and vomiting.  Neurological: Negative for weakness.  All other systems reviewed and are negative.    Allergies  Doxycycline and Prednisone  Home Medications   Current Outpatient Rx  Name  Route  Sig  Dispense  Refill  . ALLOPURINOL 100 MG PO TABS   Oral   Take 200 mg by mouth daily.         . ASPIRIN 81 MG PO TBEC   Oral   Take 81 mg by mouth daily.          . BUPROPION HCL ER (SR) 150 MG PO TB12   Oral   Take 150 mg by mouth daily. NO INSTRUCTIONS GIVEN (per mental health)         . CLONAZEPAM 1 MG PO TABS   Oral   Take 0.5-1 mg by mouth 3 (three) times daily. Takes 1 mg in the morning and 0.5 mg in the evening and at  bedtime         . DICLOFENAC SODIUM 75 MG PO TBEC   Oral   Take 75 mg by mouth 2 (two) times daily.         Marland Kitchen ESCITALOPRAM OXALATE 20 MG PO TABS   Oral   Take 20 mg by mouth daily.          Marland Kitchen GLIPIZIDE ER 2.5 MG PO TB24   Oral   Take 2.5 mg by mouth daily.         Marland Kitchen HALOPERIDOL 2 MG PO TABS   Oral   Take 2 mg by mouth 2 (two) times daily. per psych         . HYDROCHLOROTHIAZIDE 25 MG PO TABS   Oral   Take 25 mg by mouth every morning.          . IBUPROFEN 800 MG PO TABS  Oral   Take 800 mg by mouth every 8 (eight) hours as needed. For pain         . METFORMIN HCL 1000 MG PO TABS   Oral   Take 1,000 mg by mouth 2 (two) times daily with a meal.          . OMEPRAZOLE 20 MG PO CPDR   Oral   Take 20 mg by mouth daily as needed. For heartburn         . ZIPRASIDONE HCL 80 MG PO CAPS   Oral   Take 240 mg by mouth every evening. (per mental health)         . GUAIFENESIN 100 MG/5ML PO SYRP   Oral   Take 5-10 mLs (100-200 mg total) by mouth every 4 (four) hours as needed for cough.   60 mL   0   . HYDROCODONE-ACETAMINOPHEN 5-325 MG PO TABS   Oral   Take 1 tablet by mouth every 6 (six) hours as needed for pain.   10 tablet   0     BP 129/80  Pulse 88  Temp 98.4 F (36.9 C) (Oral)  Resp 14  SpO2 92%  Physical Exam  Nursing note and vitals reviewed. Constitutional: She is oriented to person, place, and time. She appears well-developed and well-nourished. No distress.  HENT:  Head: Normocephalic and atraumatic.       Posterior pharynx nl except for soot  Eyes: EOM are normal.  Neck: Neck supple. No tracheal deviation present.    Cardiovascular: Normal rate.   Pulmonary/Chest: Effort normal. No respiratory distress. She has no wheezes.       Harsh breath sounds  Musculoskeletal: Normal range of motion.  Neurological: She is alert and oriented to person, place, and time.  Skin: Skin is warm and dry.  Psychiatric: She has a normal mood and affect. Her behavior is normal.    ED Course  Procedures (including critical care time)   COORDINATION OF CARE: 11:19 AM Discussed ED treatment with pt     Labs Reviewed - No data to display Dg Chest 2 View  03/02/2012  *RADIOLOGY REPORT*  Clinical Data: nasal congestion  CHEST - 2 VIEW  Comparison: 06/16/05  Findings: Normal heart size.  No pleural effusion identified. Atelectasis versus scarring noted in the left base.  No airspace consolidation.  The visualized osseous structures are unremarkable.  IMPRESSION:  1.  Left base atelectasis versus scarring.   Original Report Authenticated By: Signa Kell, M.D.      1. URI (upper respiratory infection)       MDM  Patient with URI symptoms. X-ray negative for pneumonia. Doubt influenza or strep. Will give symptomatic relief.  I personally performed the services described in this documentation, which was scribed in my presence. The recorded information has been reviewed and is accurate.      Juliet Rude. Rubin Payor, MD 03/02/12 1120

## 2012-03-17 ENCOUNTER — Other Ambulatory Visit: Payer: Self-pay | Admitting: Internal Medicine

## 2012-03-17 DIAGNOSIS — Z1231 Encounter for screening mammogram for malignant neoplasm of breast: Secondary | ICD-10-CM

## 2012-04-09 DIAGNOSIS — E119 Type 2 diabetes mellitus without complications: Secondary | ICD-10-CM | POA: Diagnosis not present

## 2012-04-11 DIAGNOSIS — B351 Tinea unguium: Secondary | ICD-10-CM | POA: Diagnosis not present

## 2012-04-11 DIAGNOSIS — M79609 Pain in unspecified limb: Secondary | ICD-10-CM | POA: Diagnosis not present

## 2012-05-08 DIAGNOSIS — E669 Obesity, unspecified: Secondary | ICD-10-CM | POA: Diagnosis not present

## 2012-05-08 DIAGNOSIS — M25559 Pain in unspecified hip: Secondary | ICD-10-CM | POA: Diagnosis not present

## 2012-05-08 DIAGNOSIS — F17200 Nicotine dependence, unspecified, uncomplicated: Secondary | ICD-10-CM | POA: Diagnosis not present

## 2012-05-29 ENCOUNTER — Ambulatory Visit
Admission: RE | Admit: 2012-05-29 | Discharge: 2012-05-29 | Disposition: A | Discharge status: Home / Self Care | Payer: Medicare Other | Source: Ambulatory Visit | Attending: Internal Medicine | Admitting: Internal Medicine

## 2012-05-29 DIAGNOSIS — Z1231 Encounter for screening mammogram for malignant neoplasm of breast: Secondary | ICD-10-CM

## 2012-07-02 DIAGNOSIS — B351 Tinea unguium: Secondary | ICD-10-CM | POA: Diagnosis not present

## 2012-07-02 DIAGNOSIS — M79609 Pain in unspecified limb: Secondary | ICD-10-CM | POA: Diagnosis not present

## 2012-07-15 DIAGNOSIS — L0292 Furuncle, unspecified: Secondary | ICD-10-CM | POA: Diagnosis not present

## 2012-07-15 DIAGNOSIS — L0293 Carbuncle, unspecified: Secondary | ICD-10-CM | POA: Diagnosis not present

## 2012-08-12 DIAGNOSIS — R609 Edema, unspecified: Secondary | ICD-10-CM | POA: Diagnosis not present

## 2012-08-27 DIAGNOSIS — R05 Cough: Secondary | ICD-10-CM | POA: Diagnosis not present

## 2012-08-27 DIAGNOSIS — R079 Chest pain, unspecified: Secondary | ICD-10-CM | POA: Diagnosis not present

## 2012-09-02 DIAGNOSIS — R079 Chest pain, unspecified: Secondary | ICD-10-CM | POA: Diagnosis not present

## 2012-09-02 DIAGNOSIS — R05 Cough: Secondary | ICD-10-CM | POA: Diagnosis not present

## 2012-09-24 DIAGNOSIS — M79609 Pain in unspecified limb: Secondary | ICD-10-CM | POA: Diagnosis not present

## 2012-09-24 DIAGNOSIS — B351 Tinea unguium: Secondary | ICD-10-CM | POA: Diagnosis not present

## 2012-10-06 DIAGNOSIS — K219 Gastro-esophageal reflux disease without esophagitis: Secondary | ICD-10-CM | POA: Diagnosis not present

## 2012-10-06 DIAGNOSIS — Z86010 Personal history of colonic polyps: Secondary | ICD-10-CM | POA: Diagnosis not present

## 2012-10-14 DIAGNOSIS — Z86010 Personal history of colonic polyps: Secondary | ICD-10-CM | POA: Diagnosis not present

## 2012-10-14 DIAGNOSIS — K5730 Diverticulosis of large intestine without perforation or abscess without bleeding: Secondary | ICD-10-CM | POA: Diagnosis not present

## 2012-10-14 DIAGNOSIS — K649 Unspecified hemorrhoids: Secondary | ICD-10-CM | POA: Diagnosis not present

## 2012-10-16 DIAGNOSIS — E119 Type 2 diabetes mellitus without complications: Secondary | ICD-10-CM | POA: Diagnosis not present

## 2012-10-16 DIAGNOSIS — M25559 Pain in unspecified hip: Secondary | ICD-10-CM | POA: Diagnosis not present

## 2012-10-16 DIAGNOSIS — IMO0002 Reserved for concepts with insufficient information to code with codable children: Secondary | ICD-10-CM | POA: Diagnosis not present

## 2012-10-16 DIAGNOSIS — I10 Essential (primary) hypertension: Secondary | ICD-10-CM | POA: Diagnosis not present

## 2012-10-24 DIAGNOSIS — M5430 Sciatica, unspecified side: Secondary | ICD-10-CM | POA: Diagnosis not present

## 2012-10-24 DIAGNOSIS — M25559 Pain in unspecified hip: Secondary | ICD-10-CM | POA: Diagnosis not present

## 2012-10-28 DIAGNOSIS — Z23 Encounter for immunization: Secondary | ICD-10-CM | POA: Diagnosis not present

## 2012-11-19 DIAGNOSIS — B351 Tinea unguium: Secondary | ICD-10-CM | POA: Diagnosis not present

## 2012-11-19 DIAGNOSIS — M79609 Pain in unspecified limb: Secondary | ICD-10-CM | POA: Diagnosis not present

## 2012-12-02 DIAGNOSIS — E119 Type 2 diabetes mellitus without complications: Secondary | ICD-10-CM | POA: Diagnosis not present

## 2012-12-02 DIAGNOSIS — F209 Schizophrenia, unspecified: Secondary | ICD-10-CM | POA: Diagnosis not present

## 2012-12-02 DIAGNOSIS — I10 Essential (primary) hypertension: Secondary | ICD-10-CM | POA: Diagnosis not present

## 2012-12-02 DIAGNOSIS — F17200 Nicotine dependence, unspecified, uncomplicated: Secondary | ICD-10-CM | POA: Diagnosis not present

## 2012-12-02 DIAGNOSIS — Z0000 Encounter for general adult medical examination without abnormal findings: Secondary | ICD-10-CM | POA: Diagnosis not present

## 2012-12-02 DIAGNOSIS — M109 Gout, unspecified: Secondary | ICD-10-CM | POA: Diagnosis not present

## 2012-12-15 ENCOUNTER — Telehealth: Payer: Self-pay | Admitting: *Deleted

## 2012-12-15 DIAGNOSIS — B488 Other specified mycoses: Secondary | ICD-10-CM

## 2012-12-15 MED ORDER — ECONAZOLE NITRATE 1 % EX CREA: TOPICAL_CREAM | Freq: Every day | CUTANEOUS | Status: DC

## 2012-12-15 NOTE — Telephone Encounter (Signed)
Refill econazole cream done.

## 2012-12-15 NOTE — Telephone Encounter (Signed)
Pt complains of fungus under toenail that got on her feet now has acracked heel that the cream has helped, would like refill Econazole cream.

## 2012-12-19 DIAGNOSIS — B353 Tinea pedis: Secondary | ICD-10-CM | POA: Diagnosis not present

## 2012-12-19 DIAGNOSIS — M109 Gout, unspecified: Secondary | ICD-10-CM | POA: Diagnosis not present

## 2012-12-19 DIAGNOSIS — I10 Essential (primary) hypertension: Secondary | ICD-10-CM | POA: Diagnosis not present

## 2013-01-02 DIAGNOSIS — I10 Essential (primary) hypertension: Secondary | ICD-10-CM | POA: Diagnosis not present

## 2013-01-07 DIAGNOSIS — J209 Acute bronchitis, unspecified: Secondary | ICD-10-CM | POA: Diagnosis not present

## 2013-01-12 ENCOUNTER — Ambulatory Visit: Payer: Self-pay | Admitting: Podiatry

## 2013-01-13 DIAGNOSIS — J40 Bronchitis, not specified as acute or chronic: Secondary | ICD-10-CM | POA: Diagnosis not present

## 2013-01-28 ENCOUNTER — Encounter: Payer: Self-pay | Admitting: Podiatry

## 2013-02-02 ENCOUNTER — Encounter: Payer: Self-pay | Admitting: Podiatry

## 2013-02-02 ENCOUNTER — Ambulatory Visit (INDEPENDENT_AMBULATORY_CARE_PROVIDER_SITE_OTHER): Payer: Medicare Other | Admitting: Podiatry

## 2013-02-02 VITALS — BP 129/77 | HR 57 | Resp 28 | Ht 64.0 in | Wt 228.0 lb

## 2013-02-02 DIAGNOSIS — B351 Tinea unguium: Secondary | ICD-10-CM

## 2013-02-02 DIAGNOSIS — M79609 Pain in unspecified limb: Secondary | ICD-10-CM | POA: Diagnosis not present

## 2013-02-02 NOTE — Progress Notes (Signed)
Patient ID: Jacqueline Reese, female   DOB: 08-08-1955, 57 y.o.   MRN: [id number]  Subjective: Patient complaining of uncomfortable toenails. She is a known diabetic and presents at three-month intervals for debridement of painful mycotic toenails. She has been a patient in our practice since 2006.  Objective: Incurvated, hypertrophic, deformed toenails with texture and color changes x10. The nails are tender to palpation x10.  Assessment: Symptomatic onychomycoses x10  Plan: All 10 toenails are debrided without any bleeding. Reappoint at three-month intervals.

## 2013-02-18 ENCOUNTER — Ambulatory Visit: Payer: Self-pay | Admitting: Podiatry

## 2013-03-16 ENCOUNTER — Other Ambulatory Visit: Payer: Self-pay

## 2013-03-16 DIAGNOSIS — Z1231 Encounter for screening mammogram for malignant neoplasm of breast: Secondary | ICD-10-CM

## 2013-03-18 DIAGNOSIS — M545 Low back pain, unspecified: Secondary | ICD-10-CM | POA: Diagnosis not present

## 2013-03-23 DIAGNOSIS — M109 Gout, unspecified: Secondary | ICD-10-CM | POA: Diagnosis not present

## 2013-03-23 DIAGNOSIS — E785 Hyperlipidemia, unspecified: Secondary | ICD-10-CM | POA: Diagnosis not present

## 2013-03-23 DIAGNOSIS — I10 Essential (primary) hypertension: Secondary | ICD-10-CM | POA: Diagnosis not present

## 2013-03-23 DIAGNOSIS — E119 Type 2 diabetes mellitus without complications: Secondary | ICD-10-CM | POA: Diagnosis not present

## 2013-04-02 DIAGNOSIS — M545 Low back pain, unspecified: Secondary | ICD-10-CM | POA: Diagnosis not present

## 2013-04-02 DIAGNOSIS — M25559 Pain in unspecified hip: Secondary | ICD-10-CM | POA: Diagnosis not present

## 2013-04-08 DIAGNOSIS — M5430 Sciatica, unspecified side: Secondary | ICD-10-CM | POA: Diagnosis not present

## 2013-04-09 ENCOUNTER — Telehealth: Payer: Self-pay | Admitting: *Deleted

## 2013-04-09 NOTE — Telephone Encounter (Addendum)
Pt states the cream is not helping the fungus between the right 1st and 2nd toes, wants the oral medication.  I will inform Dr Valentina Lucks.  Dr Valentina Lucks ordered Lamisil generic 250mg  #21 one tablet po q daily no refills.  I informed the pt and told her a [place] would cost between $4.00 and $12.00.  Pt states use [facility] at [address].  Orders sent.

## 2013-04-10 MED ORDER — TERBINAFINE HCL 250 MG PO TABS: 250.0000 mg | ORAL_TABLET | Freq: Every day | ORAL | Status: DC

## 2013-04-27 ENCOUNTER — Encounter: Payer: Self-pay | Admitting: Podiatry

## 2013-04-27 ENCOUNTER — Ambulatory Visit (INDEPENDENT_AMBULATORY_CARE_PROVIDER_SITE_OTHER): Payer: Medicare Other | Admitting: Podiatry

## 2013-04-27 VITALS — BP 117/73 | HR 57 | Resp 18

## 2013-04-27 DIAGNOSIS — B351 Tinea unguium: Secondary | ICD-10-CM | POA: Diagnosis not present

## 2013-04-27 DIAGNOSIS — M79609 Pain in unspecified limb: Secondary | ICD-10-CM

## 2013-04-27 NOTE — Progress Notes (Signed)
° °  Subjective:    Patient ID: Jacqueline Reese, female    DOB: [birth year], 58 y.o.   MRN: [id number]  HPI I am here to get my toenails trimmed and I called about getting some medicine for the itching and moisture inbetween my toes and they gave me TERBINAFINE and I am allergic to that but didn't realize that is what the medicine was. She denies any skin lesions bleeding problems or any other bodily complaints.  Dr.Egerton called in terbinafine 250 mg #30 one by mouth daily x6 refills. Her note stated terbinafine 250 mg 21 1 daily no refill. Patient still having itching in the first right with space area.  Also, patient presents for ongoing debridement of mycotic toenails  Review of Systems     Objective:   Physical Exam Black female orientated x3  Dermatological: Small amount of white macerated tissue first right webspace. No active drainage, erythema, malodor or warmth noted in the web space area on the right foot. The dorsal right hallux has some dry lichenified .  All 10 toenails are hypertrophic, incurvated, discolored, and brittle. The nails are tender to palpation.      Assessment & Plan:   Assessment: No obvious systemic reaction or dermatological reaction to terbinafine. First right webspace tinea versus possible yeast Symptomatic onychomycoses x10  Plan: Patient advised complete no more than 30 doses of terbinafine and DC. Also, advised to purchase over-the-counter Lamisil cream applied twice a day to the right hallux or first web space x10 days.  All 10 toenails are debrided back down to bleeding. Reappoint x90 days

## 2013-04-27 NOTE — Patient Instructions (Signed)
Complete the first bottle of terbinafine 250 mg (Lamisil) and then discontinue. By over-the-counter Lamisil cream and rub into the skin on the right great toe and web space twice a day x30 days.

## 2013-05-04 ENCOUNTER — Ambulatory Visit: Payer: Medicare Other | Admitting: Podiatry

## 2013-05-09 ENCOUNTER — Encounter (HOSPITAL_COMMUNITY): Payer: Self-pay | Admitting: Emergency Medicine

## 2013-05-09 ENCOUNTER — Emergency Department (INDEPENDENT_AMBULATORY_CARE_PROVIDER_SITE_OTHER)
Admission: EM | Admit: 2013-05-09 | Discharge: 2013-05-09 | Disposition: A | Discharge status: Home / Self Care | Payer: Medicare Other | Source: Home / Self Care

## 2013-05-09 DIAGNOSIS — J9801 Acute bronchospasm: Secondary | ICD-10-CM | POA: Diagnosis not present

## 2013-05-09 DIAGNOSIS — F17200 Nicotine dependence, unspecified, uncomplicated: Secondary | ICD-10-CM

## 2013-05-09 DIAGNOSIS — J309 Allergic rhinitis, unspecified: Secondary | ICD-10-CM | POA: Diagnosis not present

## 2013-05-09 DIAGNOSIS — Z720 Tobacco use: Secondary | ICD-10-CM

## 2013-05-09 MED ORDER — IPRATROPIUM-ALBUTEROL 0.5-2.5 (3) MG/3ML IN SOLN
RESPIRATORY_TRACT | Status: AC
  Filled 2013-05-09: qty 3

## 2013-05-09 MED ORDER — ALBUTEROL SULFATE HFA 108 (90 BASE) MCG/ACT IN AERS: 2.0000 | INHALATION_SPRAY | RESPIRATORY_TRACT | Status: DC | PRN

## 2013-05-09 MED ORDER — IPRATROPIUM-ALBUTEROL 0.5-2.5 (3) MG/3ML IN SOLN
3.0000 mL | Freq: Once | RESPIRATORY_TRACT | Status: AC
  Administered 2013-05-09: 3 mL via RESPIRATORY_TRACT

## 2013-05-09 MED ORDER — FLUTICASONE-SALMETEROL 250-50 MCG/DOSE IN AEPB: 1.0000 | INHALATION_SPRAY | Freq: Two times a day (BID) | RESPIRATORY_TRACT | Status: DC

## 2013-05-09 NOTE — Discharge Instructions (Signed)
Allergies °Allergies may happen from anything your body is sensitive to. This may be food, medicines, pollens, chemicals, and nearly anything around you in everyday life that produces allergens. An allergen is anything that causes an allergy producing substance. Heredity is often a factor in causing these problems. This means you may have some of the same allergies as your parents. °Food allergies happen in all age groups. Food allergies are some of the most severe and life threatening. Some common food allergies are cow's milk, seafood, eggs, nuts, wheat, and soybeans. °SYMPTOMS  °· Swelling around the mouth. °· An itchy red rash or hives. °· Vomiting or diarrhea. °· Difficulty breathing. °SEVERE ALLERGIC REACTIONS ARE LIFE-THREATENING. °This reaction is called anaphylaxis. It can cause the mouth and throat to swell and cause difficulty with breathing and swallowing. In severe reactions only a trace amount of food (for example, peanut oil in a salad) may cause death within seconds. °Seasonal allergies occur in all age groups. These are seasonal because they usually occur during the same season every year. They may be a reaction to molds, grass pollens, or tree pollens. Other causes of problems are house dust mite allergens, pet dander, and mold spores. The symptoms often consist of nasal congestion, a runny itchy nose associated with sneezing, and tearing itchy eyes. There is often an associated itching of the mouth and ears. The problems happen when you come in contact with pollens and other allergens. Allergens are the particles in the air that the body reacts to with an allergic reaction. This causes you to release allergic antibodies. Through a chain of events, these eventually cause you to release histamine into the blood stream. Although it is meant to be protective to the body, it is this release that causes your discomfort. This is why you were given anti-histamines to feel better.  If you are unable to  pinpoint the offending allergen, it may be determined by skin or blood testing. Allergies cannot be cured but can be controlled with medicine. °Hay fever is a collection of all or some of the seasonal allergy problems. It may often be treated with simple over-the-counter medicine such as diphenhydramine. Take medicine as directed. Do not drink alcohol or drive while taking this medicine. Check with your caregiver or package insert for child dosages. °If these medicines are not effective, there are many new medicines your caregiver can prescribe. Stronger medicine such as nasal spray, eye drops, and corticosteroids may be used if the first things you try do not work well. Other treatments such as immunotherapy or desensitizing injections can be used if all else fails. Follow up with your caregiver if problems continue. These seasonal allergies are usually not life threatening. They are generally more of a nuisance that can often be handled using medicine. °HOME CARE INSTRUCTIONS  °· If unsure what causes a reaction, keep a diary of foods eaten and symptoms that follow. Avoid foods that cause reactions. °· If hives or rash are present: °· Take medicine as directed. °· You may use an over-the-counter antihistamine (diphenhydramine) for hives and itching as needed. °· Apply cold compresses (cloths) to the skin or take baths in cool water. Avoid hot baths or showers. Heat will make a rash and itching worse. °· If you are severely allergic: °· Following a treatment for a severe reaction, hospitalization is often required for closer follow-up. °· Wear a medic-alert bracelet or necklace stating the allergy. °· You and your family must learn how to give adrenaline or use   an anaphylaxis kit.  If you have had a severe reaction, always carry your anaphylaxis kit or EpiPen with you. Use this medicine as directed by your caregiver if a severe reaction is occurring. Failure to do so could have a fatal outcome. SEEK MEDICAL  CARE IF:  You suspect a food allergy. Symptoms generally happen within 30 minutes of eating a food.  Your symptoms have not gone away within 2 days or are getting worse.  You develop new symptoms.  You want to retest yourself or your child with a food or drink you think causes an allergic reaction. Never do this if an anaphylactic reaction to that food or drink has happened before. Only do this under the care of a caregiver. SEEK IMMEDIATE MEDICAL CARE IF:   You have difficulty breathing, are wheezing, or have a tight feeling in your chest or throat.  You have a swollen mouth, or you have hives, swelling, or itching all over your body.  You have had a severe reaction that has responded to your anaphylaxis kit or an EpiPen. These reactions may return when the medicine has worn off. These reactions should be considered life threatening. MAKE SURE YOU:   Understand these instructions.  Will watch your condition.  Will get help right away if you are not doing well or get worse. Document Released: 05/15/2002 Document Revised: 06/16/2012 Document Reviewed: 10/20/2007 [facility] Patient Information 2014 [place].  Allergic Rhinitis Allergic rhinitis is when the mucous membranes in the nose respond to allergens. Allergens are particles in the air that cause your body to have an allergic reaction. This causes you to release allergic antibodies. Through a chain of events, these eventually cause you to release histamine into the blood stream. Although meant to protect the body, it is this release of histamine that causes your discomfort, such as frequent sneezing, congestion, and an itchy, runny nose.  CAUSES  Seasonal allergic rhinitis (hay fever) is caused by pollen allergens that may come from grasses, trees, and weeds. Year-round allergic rhinitis (perennial allergic rhinitis) is caused by allergens such as house dust mites, pet dander, and mold spores.  SYMPTOMS   Nasal stuffiness  (congestion).  Itchy, runny nose with sneezing and tearing of the eyes. DIAGNOSIS  Your health care provider can help you determine the allergen or allergens that trigger your symptoms. If you and your health care provider are unable to determine the allergen, skin or blood testing may be used. TREATMENT  Allergic Rhinitis does not have a cure, but it can be controlled by:  Medicines and allergy shots (immunotherapy).  Avoiding the allergen. Hay fever may often be treated with antihistamines in pill or nasal spray forms. Antihistamines block the effects of histamine. There are over-the-counter medicines that may help with nasal congestion and swelling around the eyes. Check with your health care provider before taking or giving this medicine.  If avoiding the allergen or the medicine prescribed do not work, there are many new medicines your health care provider can prescribe. Stronger medicine may be used if initial measures are ineffective. Desensitizing injections can be used if medicine and avoidance does not work. Desensitization is when a patient is given ongoing shots until the body becomes less sensitive to the allergen. Make sure you follow up with your health care provider if problems continue. HOME CARE INSTRUCTIONS It is not possible to completely avoid allergens, but you can reduce your symptoms by taking steps to limit your exposure to them. It helps to know  exactly what you are allergic to so that you can avoid your specific triggers. SEEK MEDICAL CARE IF:   You have a fever.  You develop a cough that does not stop easily (persistent).  You have shortness of breath.  You start wheezing.  Symptoms interfere with normal daily activities. Document Released: 11/14/2000 Document Revised: 12/10/2012 Document Reviewed: 10/27/2012 [facility] Patient Information 2014 [place].  Bronchospasm, Adult A bronchospasm is when the tubes that carry air in and out of your lungs  (airwarys) spasm or tighten. During a bronchospasm it is hard to breathe. This is because the airways get smaller. A bronchospasm can be triggered by:  Allergies. These may be to animals, pollen, food, or mold.  Infection. This is a common cause of bronchospasm.  Exercise.  Irritants. These include pollution, cigarette smoke, strong odors, aerosol sprays, and paint fumes.  Weather changes.  Stress.  Being emotional. HOME CARE   Always have a plan for getting help. Know when to call your doctor and local emergency services (911 in the [place] Know where you can get emergency care.  Only take medicines as told by your doctor.  If you were prescribed an inhaler or nebulizer machine, ask your doctor how to use it correctly. Always use a spacer with your inhaler if you were given one.  Stay calm during an attack. Try to relax and breathe more slowly.  Control your home environment:  Change your heating and air conditioning filter at least once a month.  Limit your use of fireplaces and wood stoves.  Do not  smoke. Do not  allow smoking in your home.  Avoid perfumes and fragrances.  Get rid of pests (such as roaches and mice) and their droppings.  Throw away plants if you see mold on them.  Keep your house clean and dust free.  Replace carpet with wood, tile, or vinyl flooring. Carpet can trap dander and dust.  Use allergy-proof pillows, mattress covers, and box spring covers.  Wash bed sheets and blankets every week in hot water. Dry them in a dryer.  Use blankets that are made of polyester or cotton.  Wash hands frequently. GET HELP IF:  You have muscle aches.  You have chest pain.  The thick spit you spit or cough up (sputum) changes from clear or white to yellow, green, gray, or bloody.  The thick spit you spit or cough up gets thicker.  There are problems that may be related to the medicine you are given such as:  A rash.  Itching.  Swelling.  Trouble  breathing. GET HELP RIGHT AWAY IF:  You feel you cannot breathe or catch your breath.  You cannot stop coughing.  Your treatment is not helping you breathe better. MAKE SURE YOU:   Understand these instructions.  Will watch your condition.  Will get help right away if you are not doing well or get worse. Document Released: 12/17/2008 Document Revised: 10/22/2012 Document Reviewed: 08/12/2012 [facility] Patient Information 2014 [place].  Chronic Obstructive Pulmonary Disease Chronic obstructive pulmonary disease (COPD) is a common lung problem. In COPD, the flow of air from the lungs is limited. The way your lungs work will probably never return to normal, but there are things you can do to improve you lungs and make yourself feel better. HOME CARE  Take all medicines as told by your doctor.  Only take over-the-counter or prescription medicines as told by your doctor.  Avoid medicines or cough syrups that dry up your  airway (such as antihistamines) and do not allow you to get rid of thick spit. You do not need to avoid them if told differently by your doctor.  If you smoke, stop. Smoking makes the problem worse.  Avoid being around things that make your breathing worse (like smoke, chemicals, and fumes).  Use oxygen therapy and therapy to help improve your lungs (pulmonary rehabilitation) if told by your doctor. If you need home oxygen therapy, ask your doctor if you should buy a tool to measure your oxygen level (oximeter).  Avoid people who have a sickness you can catch (contagious).  Avoid going outside when it is very hot, cold, or humid.  Eat healthy foods. Eat smaller meals more often. Rest before meals.  Stay active, but remember to also rest.  Make sure to get all the shots (vaccines) your doctor recommends. Ask your doctor if you need a pneumonia shot.  Learn and use tips on how to relax.  Learn and use tips on how to control your breathing as told by your  doctor. Try:  Breathing in (inhaling) through your nose for 1 second. Then, pucker your lips and breath out (exhale) through your lips for 2 seconds.  Putting one hand on your belly (abdomen). Breathe in slowly through your nose for 1 second. Your hand on your belly should move out. Pucker your lips and breathe out slowly through your lips. Your hand on your belly should move in as you breathe out.  Learn and use controlled coughing to clear thick spit from your lungs. 1. Lean your head a little forward. 2. Breathe in deeply. 3. Try to hold your breath for 3 seconds. 4. Keep your mouth slightly open while coughing 2 times. 5. Spit any thick spit out into a tissue. 6. Rest and do the steps again 1 or 2 times as needed. GET HELP IF:  You cough up more thick spit than usual.  There is a change in the color or thickness of the spit.  It is harder to breathe than usual.  Your breathing is faster than usual. GET HELP RIGHT AWAY IF:   You have shortness of breath while resting.  You have shortness of breath that stops you from:  Being able to talk.  Doing normal activities.  You chest hurts for longer than 5 minutes.  Your skin color is more blue than usual.  Your pulse oximeter shows that you have low oxygen for longer than 5 minutes. MAKE SURE YOU:   Understand these instructions.  Will watch your condition.  Will get help right away if you are not doing well or get worse. Document Released: 08/08/2007 Document Revised: 12/10/2012 Document Reviewed: 10/16/2012 [facility] Patient Information 2014 [place], [place].  How to Use an Inhaler Using your inhaler correctly is very important. Good technique will make sure that the medicine reaches your lungs.  HOW TO USE AN INHALER: 1. Take the cap off the inhaler. 2. If this is the first time using your inhaler, you need to prime it. Shake the inhaler for 5 seconds. Release four puffs into the air, away from your face. Ask your  doctor for help if you have questions. 3. Shake the inhaler for 5 seconds. 4. Turn the inhaler so the bottle is above the mouthpiece. 5. Put your pointer finger on top of the bottle. Your thumb holds the bottom of the inhaler. 6. Open your mouth. 7. Either hold the inhaler away from your mouth (the width of 2 fingers)  or place your lips tightly around the mouthpiece. Ask your doctor which way to use your inhaler. 8. Breathe out as much air as possible. 9. Breathe in and push down on the bottle 1 time to release the medicine. You will feel the medicine go in your mouth and throat. 10. Continue to take a deep breath in very slowly. Try to fill your lungs. 11. After you have breathed in completely, hold your breath for 10 seconds. This will help the medicine to settle in your lungs. If you cannot hold your breath for 10 seconds, hold it for as long as you can before you breathe out. 12. Breathe out slowly, through pursed lips. Whistling is an example of pursed lips. 13. If your doctor has told you to take more than 1 puff, wait at least 15 30 seconds between puffs. This will help you get the best results from your medicine. Do not use the inhaler more than your doctor tells you to. 14. Put the cap back on the inhaler. 15. Follow the directions from your doctor or from the inhaler package about cleaning the inhaler. If you use more than one inhaler, ask your doctor which inhalers to use and what order to use them in. Ask your doctor to help you figure out when you will need to refill your inhaler.  If you use a steroid inhaler, always rinse your mouth with water after your last puff, gargle and spit out the water. Do not swallow the water. GET HELP IF:  The inhaler medicine only partially helps to stop wheezing or shortness of breath.  You are having trouble using your inhaler.  You have some increase in thick spit (phlegm). GET HELP RIGHT AWAY IF:  The inhaler medicine does not help your  wheezing or shortness of breath or you have tightness in your chest.  You have dizziness, headaches, or fast heart rate.  You have chills, fever, or night sweats.  You have a large increase of thick spit, or your thick spit is bloody. MAKE SURE YOU:   Understand these instructions.  Will watch your condition.  Will get help right away if you are not doing well or get worse. Document Released: 11/29/2007 Document Revised: 12/10/2012 Document Reviewed: 09/18/2012 [facility] Patient Information 2014 [place], [place].  Smoking Cessation Quitting smoking is important to your health and has many advantages. However, it is not always easy to quit since nicotine is a very addictive drug. Often times, people try 3 times or more before being able to quit. This document explains the best ways for you to prepare to quit smoking. Quitting takes hard work and a lot of effort, but you can do it. ADVANTAGES OF QUITTING SMOKING  You will live longer, feel better, and live better.  Your body will feel the impact of quitting smoking almost immediately.  Within 20 minutes, blood pressure decreases. Your pulse returns to its normal level.  After 8 hours, carbon monoxide levels in the blood return to normal. Your oxygen level increases.  After 24 hours, the chance of having a heart attack starts to decrease. Your breath, hair, and body stop smelling like smoke.  After 48 hours, damaged nerve endings begin to recover. Your sense of taste and smell improve.  After 72 hours, the body is virtually free of nicotine. Your bronchial tubes relax and breathing becomes easier.  After 2 to 12 weeks, lungs can hold more air. Exercise becomes easier and circulation improves.  The risk of having a  heart attack, stroke, cancer, or lung disease is greatly reduced.  After 1 year, the risk of coronary heart disease is cut in half.  After 5 years, the risk of stroke falls to the same as a nonsmoker.  After 10 years,  the risk of lung cancer is cut in half and the risk of other cancers decreases significantly.  After 15 years, the risk of coronary heart disease drops, usually to the level of a nonsmoker.  If you are pregnant, quitting smoking will improve your chances of having a healthy baby.  The people you live with, especially any children, will be healthier.  You will have extra money to spend on things other than cigarettes. QUESTIONS TO THINK ABOUT BEFORE ATTEMPTING TO QUIT You may want to talk about your answers with your caregiver.  Why do you want to quit?  If you tried to quit in the past, what helped and what did not?  What will be the most difficult situations for you after you quit? How will you plan to handle them?  Who can help you through the tough times? Your family? Friends? A caregiver?  What pleasures do you get from smoking? What ways can you still get pleasure if you quit? Here are some questions to ask your caregiver:  How can you help me to be successful at quitting?  What medicine do you think would be best for me and how should I take it?  What should I do if I need more help?  What is smoking withdrawal like? How can I get information on withdrawal? GET READY  Set a quit date.  Change your environment by getting rid of all cigarettes, ashtrays, matches, and lighters in your home, car, or work. Do not let people smoke in your home.  Review your past attempts to quit. Think about what worked and what did not. GET SUPPORT AND ENCOURAGEMENT You have a better chance of being successful if you have help. You can get support in many ways.  Tell your family, friends, and co-workers that you are going to quit and need their support. Ask them not to smoke around you.  Get individual, group, or telephone counseling and support. Programs are available at [employer] and health centers. Call your local health department for information about programs in your  area.  Spiritual beliefs and practices may help some smokers quit.  Download a "quit meter" on your computer to keep track of quit statistics, such as how long you have gone without smoking, cigarettes not smoked, and money saved.  Get a self-help book about quitting smoking and staying off of tobacco. [place] yourself from urges to smoke. Talk to someone, go for a walk, or occupy your time with a task.  Change your normal routine. Take a different route to work. Drink tea instead of coffee. Eat breakfast in a different place.  Reduce your stress. Take a hot bath, exercise, or read a book.  Plan something enjoyable to do every day. Reward yourself for not smoking.  Explore interactive web-based programs that specialize in helping you quit. GET MEDICINE AND USE IT CORRECTLY Medicines can help you stop smoking and decrease the urge to smoke. Combining medicine with the above behavioral methods and support can greatly increase your chances of successfully quitting smoking.  Nicotine replacement therapy helps deliver nicotine to your body without the negative effects and risks of smoking. Nicotine replacement therapy includes nicotine gum, lozenges, inhalers, nasal  sprays, and skin patches. Some may be available over-the-counter and others require a prescription.  Antidepressant medicine helps people abstain from smoking, but how this works is unknown. This medicine is available by prescription.  Nicotinic receptor partial agonist medicine simulates the effect of nicotine in your brain. This medicine is available by prescription. Ask your caregiver for advice about which medicines to use and how to use them based on your health history. Your caregiver will tell you what side effects to look out for if you choose to be on a medicine or therapy. Carefully read the information on the package. Do not use any other product containing nicotine while using a nicotine  replacement product.  RELAPSE OR DIFFICULT SITUATIONS Most relapses occur within the first 3 months after quitting. Do not be discouraged if you start smoking again. Remember, most people try several times before finally quitting. You may have symptoms of withdrawal because your body is used to nicotine. You may crave cigarettes, be irritable, feel very hungry, cough often, get headaches, or have difficulty concentrating. The withdrawal symptoms are only temporary. They are strongest when you first quit, but they will go away within 10 14 days. To reduce the chances of relapse, try to:  Avoid drinking alcohol. Drinking lowers your chances of successfully quitting.  Reduce the amount of caffeine you consume. Once you quit smoking, the amount of caffeine in your body increases and can give you symptoms, such as a rapid heartbeat, sweating, and anxiety.  Avoid smokers because they can make you want to smoke.  Do not let weight gain distract you. Many smokers will gain weight when they quit, usually less than 10 pounds. Eat a healthy diet and stay active. You can always lose the weight gained after you quit.  Find ways to improve your mood other than smoking. FOR MORE INFORMATION  www.smokefree.gov  Document Released: 02/13/2001 Document Revised: 08/21/2011 Document Reviewed: 05/31/2011 [facility] Patient Information 2014 [place], [place].

## 2013-05-09 NOTE — ED Provider Notes (Signed)
CSN: [id number]     Arrival date & time 05/09/13  1218 History   First MD Initiated Contact with Patient 05/09/13 1410     No chief complaint on file.  (Consider location/radiation/quality/duration/timing/severity/associated sxs/prior Treatment) HPI Comments: 58 year old female who is a daily smoker is complaining of cough, sneezing runny nose for one week. She just started taking Claritin today.   Past Medical History  Diagnosis Date  . Diabetes mellitus without complication   . Schizophrenic disorder   . Hypertension    History reviewed. No pertinent past surgical history. No family history on file. History  Substance Use Topics  . Smoking status: Current Every Day Smoker -- 0.33 packs/day    Types: Cigarettes  . Smokeless tobacco: Never Used  . Alcohol Use: No   OB History   Grav Para Term Preterm Abortions TAB SAB Ect Mult Living                 Review of Systems  Constitutional: Negative for fever, chills, activity change and appetite change.  HENT: Positive for rhinorrhea and sneezing.   Eyes: Negative for visual disturbance.  Respiratory: Positive for cough. Negative for shortness of breath and wheezing.   Cardiovascular: Negative for chest pain.  Gastrointestinal: Negative.   Genitourinary: Negative.   Neurological: Negative for facial asymmetry, speech difficulty and headaches.    Allergies  Chantix; Doxycycline; Lamisil; Prednisone; and Sulfa antibiotics  Home Medications   Current Outpatient Rx  Name  Route  Sig  Dispense  Refill  . ADVAIR DISKUS 250-50 MCG/DOSE AEPB               . albuterol (PROVENTIL HFA;VENTOLIN HFA) 108 (90 BASE) MCG/ACT inhaler   Inhalation   Inhale 2 puffs into the lungs every 4 (four) hours as needed for wheezing or shortness of breath.   1 Inhaler   0   . allopurinol (ZYLOPRIM) 100 MG tablet   Oral   Take 200 mg by mouth daily.         Marland Kitchen aspirin (ASPIR-LOW) 81 MG EC tablet   Oral   Take 81 mg by mouth daily.          Marland Kitchen buPROPion (WELLBUTRIN SR) 150 MG 12 hr tablet   Oral   Take 150 mg by mouth daily. NO INSTRUCTIONS GIVEN (per mental health)         . clonazePAM (KLONOPIN) 1 MG tablet   Oral   Take 0.5-1 mg by mouth 3 (three) times daily. Takes 1 mg in the morning and 0.5 mg in the evening and at  bedtime         . COLCRYS 0.6 MG tablet               . diclofenac (VOLTAREN) 75 MG EC tablet   Oral   Take 75 mg by mouth 2 (two) times daily.         Marland Kitchen econazole nitrate 1 % cream   Topical   Apply topically daily.   15 g   3     Dispense as written.   . escitalopram (LEXAPRO) 20 MG tablet   Oral   Take 20 mg by mouth daily.          . Fluticasone-Salmeterol (ADVAIR DISKUS) 250-50 MCG/DOSE AEPB   Inhalation   Inhale 1 puff into the lungs every 12 (twelve) hours.   1 each   0   . glipiZIDE (GLUCOTROL XL) 2.5 MG 24 hr tablet   Oral  Take 2.5 mg by mouth daily.         Marland Kitchen guaifenesin (ROBITUSSIN) 100 MG/5ML syrup   Oral   Take 5-10 mLs (100-200 mg total) by mouth every 4 (four) hours as needed for cough.   60 mL   0   . haloperidol (HALDOL) 2 MG tablet   Oral   Take 2 mg by mouth 2 (two) times daily. per psych         . hydrochlorothiazide 25 MG tablet   Oral   Take 25 mg by mouth every morning.          Marland Kitchen HYDROcodone-acetaminophen (NORCO) 5-325 MG per tablet   Oral   Take 1 tablet by mouth every 6 (six) hours as needed for pain.   10 tablet   0   . ibuprofen (ADVIL,MOTRIN) 800 MG tablet   Oral   Take 800 mg by mouth every 8 (eight) hours as needed. For pain         . lisinopril (PRINIVIL,ZESTRIL) 10 MG tablet               . metFORMIN (GLUCOPHAGE) 1000 MG tablet   Oral   Take 1,000 mg by mouth 2 (two) times daily with a meal.          . mirtazapine (REMERON) 15 MG tablet               . omeprazole (PRILOSEC) 20 MG capsule   Oral   Take 20 mg by mouth daily as needed. For heartburn         . PROAIR HFA 108 (90 BASE) MCG/ACT  inhaler               . terbinafine (LAMISIL) 250 MG tablet   Oral   Take 1 tablet (250 mg total) by mouth daily.   221 tablet   0   . traMADol-acetaminophen (ULTRACET) 37.5-325 MG per tablet               . ziprasidone (GEODON) 80 MG capsule   Oral   Take 240 mg by mouth every evening. (per mental health)          BP 147/83  Pulse 62  Temp(Src) 98.4 F (36.9 C) (Oral)  Resp 20  SpO2 96% Physical Exam  Nursing note and vitals reviewed. Constitutional: She is oriented to person, place, and time. She appears well-developed and well-nourished. No distress.  Morbidly obese  HENT:  Mouth/Throat: No oropharyngeal exudate.  Bilateral TMs are normal Oropharynx with moderate erythema otherwise normal  Eyes: Conjunctivae and EOM are normal.  Neck: Normal range of motion. Neck supple. No thyromegaly present.  Cardiovascular: Normal rate, regular rhythm and normal heart sounds.   Pulmonary/Chest: Effort normal. She has wheezes.  Prolonged expiratory phase. Diffuse bilateral expiratory wheezes.  Musculoskeletal: She exhibits no edema and no tenderness.  Lymphadenopathy:    She has no cervical adenopathy.  Neurological: She is alert and oriented to person, place, and time. She exhibits normal muscle tone.  Skin: Skin is warm and dry. No rash noted.  Psychiatric: She has a normal mood and affect.    ED Course  Procedures (including critical care time) Labs Review Labs Reviewed - No data to display Imaging Review No results found.   MDM   1. Allergic rhinitis   2. Bronchospasm   3. Tobacco abuse    Duoneb now Stop smoking Use albuterol 2 puffs q 4h prn. Not just 1 x d. When  cough and wheezing stops use it only prn. Continue Claritin for sneeze and runny nose Refill Advair 50/250 F/U with your PCP  1445: Post Duoneb pt st feels much better, breathing better, less cough. Auscultation with much improved air movement and rare end expiratory wheeze      Janne Napoleon, NP 05/09/13 1458

## 2013-05-09 NOTE — ED Notes (Signed)
Cough and cold for one week.  Shortness of breath today

## 2013-05-09 NOTE — ED Provider Notes (Signed)
Medical screening examination/treatment/procedure(s) were performed by resident physician or non-physician practitioner and as supervising physician I was immediately available for consultation/collaboration.   Pauline Good MD.   Billy Fischer, MD 05/09/13 [phone]

## 2013-05-31 ENCOUNTER — Emergency Department (INDEPENDENT_AMBULATORY_CARE_PROVIDER_SITE_OTHER)
Admission: EM | Admit: 2013-05-31 | Discharge: 2013-05-31 | Disposition: A | Discharge status: Home / Self Care | Payer: Medicare Other | Source: Home / Self Care | Attending: Family Medicine | Admitting: Family Medicine

## 2013-05-31 ENCOUNTER — Encounter (HOSPITAL_COMMUNITY): Payer: Self-pay | Admitting: Emergency Medicine

## 2013-05-31 DIAGNOSIS — M545 Low back pain, unspecified: Secondary | ICD-10-CM

## 2013-05-31 HISTORY — DX: Dorsalgia, unspecified: M54.9

## 2013-05-31 HISTORY — DX: Major depressive disorder, single episode, unspecified: F32.9

## 2013-05-31 HISTORY — DX: Other chronic pain: G89.29

## 2013-05-31 HISTORY — DX: Depression, unspecified: F32.A

## 2013-05-31 HISTORY — DX: Gout, unspecified: M10.9

## 2013-05-31 MED ORDER — METHYLPREDNISOLONE ACETATE 80 MG/ML IJ SUSP
80.0000 mg | Freq: Once | INTRAMUSCULAR | Status: AC
  Administered 2013-05-31: 80 mg via INTRAMUSCULAR

## 2013-05-31 MED ORDER — METHYLPREDNISOLONE ACETATE 80 MG/ML IJ SUSP
INTRAMUSCULAR | Status: AC
  Filled 2013-05-31: qty 1

## 2013-05-31 NOTE — ED Provider Notes (Signed)
CSN: [id number]     Arrival date & time 05/31/13  0900 History   First MD Initiated Contact with Patient 05/31/13 [phone]     Chief Complaint  Patient presents with  . Back Pain  . Leg Pain   (Consider location/radiation/quality/duration/timing/severity/associated sxs/prior Treatment) HPI  Back Pain: Location: lower back, buttocks and posterior legs bilaterally Duration: years  Quality: 8/10 Current Functional Status:  ADL's Not limited Preceding Events: no falls. no trauma Alleviating Factors: steroid injections Exacerbating Factors: walking n Hx of intervention: she usually seen orthopedic office and has steroid injections for the pain but has not had one,  no surgery, no PT Hx of imaging: she denies history of MRI Red Flags: no weakness, no numbness and tingling, the impaired bowel or bladder function     Past Medical History  Diagnosis Date  . Diabetes mellitus without complication   . Schizophrenic disorder   . Hypertension   . Chronic back pain   . Gout   . Depression    Past Surgical History  Procedure Laterality Date  . Cesarean section      x2   No family history on file. History  Substance Use Topics  . Smoking status: Current Every Day Smoker -- 0.33 packs/day    Types: Cigarettes  . Smokeless tobacco: Never Used  . Alcohol Use: No   OB History   Grav Para Term Preterm Abortions TAB SAB Ect Mult Living                 Review of Systems  Allergies  Chantix; Doxycycline; Lamisil; Prednisone; and Sulfa antibiotics  Home Medications   Current Outpatient Rx  Name  Route  Sig  Dispense  Refill  . albuterol (PROVENTIL HFA;VENTOLIN HFA) 108 (90 BASE) MCG/ACT inhaler   Inhalation   Inhale 2 puffs into the lungs every 4 (four) hours as needed for wheezing or shortness of breath.   1 Inhaler   0   . allopurinol (ZYLOPRIM) 100 MG tablet   Oral   Take 200 mg by mouth daily.         Marland Kitchen aspirin (ASPIR-LOW) 81 MG EC tablet   Oral   Take 81 mg by  mouth daily.          Marland Kitchen buPROPion (WELLBUTRIN SR) 150 MG 12 hr tablet   Oral   Take 150 mg by mouth daily. NO INSTRUCTIONS GIVEN (per mental health)         . clonazePAM (KLONOPIN) 1 MG tablet   Oral   Take 0.5-1 mg by mouth 3 (three) times daily. Takes 1 mg in the morning and 0.5 mg in the evening and at  bedtime         . COLCRYS 0.6 MG tablet               . escitalopram (LEXAPRO) 20 MG tablet   Oral   Take 20 mg by mouth daily.          . Fluticasone-Salmeterol (ADVAIR DISKUS) 250-50 MCG/DOSE AEPB   Inhalation   Inhale 1 puff into the lungs every 12 (twelve) hours.   1 each   0   . glipiZIDE (GLUCOTROL XL) 2.5 MG 24 hr tablet   Oral   Take 2.5 mg by mouth daily.         Marland Kitchen lisinopril (PRINIVIL,ZESTRIL) 10 MG tablet               . metFORMIN (GLUCOPHAGE) 1000 MG tablet  Oral   Take 1,000 mg by mouth 2 (two) times daily with a meal.          . omeprazole (PRILOSEC) 20 MG capsule   Oral   Take 20 mg by mouth daily as needed. For heartburn         . traMADol-acetaminophen (ULTRACET) 37.5-325 MG per tablet               . ziprasidone (GEODON) 80 MG capsule   Oral   Take 240 mg by mouth every evening. (per mental health)         . ADVAIR DISKUS 250-50 MCG/DOSE AEPB               . diclofenac (VOLTAREN) 75 MG EC tablet   Oral   Take 75 mg by mouth 2 (two) times daily.         Marland Kitchen econazole nitrate 1 % cream   Topical   Apply topically daily.   15 g   3     Dispense as written.   Marland Kitchen guaifenesin (ROBITUSSIN) 100 MG/5ML syrup   Oral   Take 5-10 mLs (100-200 mg total) by mouth every 4 (four) hours as needed for cough.   60 mL   0   . haloperidol (HALDOL) 2 MG tablet   Oral   Take 2 mg by mouth 2 (two) times daily. per psych         . hydrochlorothiazide 25 MG tablet   Oral   Take 25 mg by mouth every morning.          Marland Kitchen HYDROcodone-acetaminophen (NORCO) 5-325 MG per tablet   Oral   Take 1 tablet by mouth every 6 (six)  hours as needed for pain.   10 tablet   0   . ibuprofen (ADVIL,MOTRIN) 800 MG tablet   Oral   Take 800 mg by mouth every 8 (eight) hours as needed. For pain         . mirtazapine (REMERON) 15 MG tablet               . PROAIR HFA 108 (90 BASE) MCG/ACT inhaler               . terbinafine (LAMISIL) 250 MG tablet   Oral   Take 1 tablet (250 mg total) by mouth daily.   221 tablet   0    BP 121/81  Pulse 66  Temp(Src) 98.2 F (36.8 C) (Oral)  Resp 16  SpO2 94%  LMP 05/09/2010 Physical Exam Gen. Obese, middle-aged female, non-ill-appearing, pleasant Back:  Appearance: sciolosis no Palpation: tenderness of paraspinal muscles no, spinous process no; pelvis no  Flexion: normal Extension: [place]  Neuro: Strength knee extension 5/5, knee flexion 5/5, dorsiflexion 5/5, plantar flexion 5/5 Reflexes: patella 2/2 Bilateral  Achilles 2/2 Bilateral Straight Leg Raise: negative bilaterally Sensation to light touch intact: yes         ED Course  Procedures (including critical care time) Labs Review Labs Reviewed - No data to display Imaging Review No results found.   MDM   1. Lumbago    Besides weakness or neurologic dysfunction, therefore imaging work up. Corticosteroid injection given per patient request. Patient will follow up with PCP.    Angelica Ran, MD 05/31/13 [phone]

## 2013-05-31 NOTE — ED Notes (Signed)
C/O chronic low back pain flare-up, radiating into bilat buttocks and bilateral posterior leg pain without parasthesias.  Has been taking IBU and tramadol with some relief.

## 2013-05-31 NOTE — Discharge Instructions (Signed)
Dear Ms. Stach,   Thank you for coming to clinic today. Please read below regarding the issues that we discussed.   1. Back pain - I hope the steroid injection helps. You do not need imaging of his time. The pain gets worse, then please follow up with their regular doctor.  2. Diabetes - since you're getting a steroid injection today, and this can increase your sugar levels. Please be careful with your diet and talked her regular doctor about your medications if your sugars remain high.  Sincerely,   Dr. Maricela Bo

## 2013-06-01 ENCOUNTER — Ambulatory Visit
Admission: RE | Admit: 2013-06-01 | Discharge: 2013-06-01 | Disposition: A | Discharge status: Home / Self Care | Payer: Medicare Other | Source: Ambulatory Visit

## 2013-06-01 DIAGNOSIS — I10 Essential (primary) hypertension: Secondary | ICD-10-CM | POA: Diagnosis not present

## 2013-06-01 DIAGNOSIS — Z1231 Encounter for screening mammogram for malignant neoplasm of breast: Secondary | ICD-10-CM

## 2013-06-01 DIAGNOSIS — E119 Type 2 diabetes mellitus without complications: Secondary | ICD-10-CM | POA: Diagnosis not present

## 2013-06-03 NOTE — ED Provider Notes (Signed)
Medical screening examination/treatment/procedure(s) were performed by a resident physician or non-physician practitioner and as the supervising physician I was immediately available for consultation/collaboration.  Lynne Leader, MD    Gregor Hams, MD 06/03/13 [phone]

## 2013-06-08 ENCOUNTER — Other Ambulatory Visit (HOSPITAL_COMMUNITY)
Admission: RE | Admit: 2013-06-08 | Discharge: 2013-06-08 | Disposition: A | Discharge status: Home / Self Care | Payer: Medicare Other | Source: Ambulatory Visit | Attending: Internal Medicine | Admitting: Internal Medicine

## 2013-06-08 ENCOUNTER — Other Ambulatory Visit: Payer: Self-pay | Admitting: Internal Medicine

## 2013-06-08 DIAGNOSIS — N899 Noninflammatory disorder of vagina, unspecified: Secondary | ICD-10-CM | POA: Diagnosis not present

## 2013-06-08 DIAGNOSIS — M109 Gout, unspecified: Secondary | ICD-10-CM | POA: Diagnosis not present

## 2013-06-08 DIAGNOSIS — E119 Type 2 diabetes mellitus without complications: Secondary | ICD-10-CM | POA: Diagnosis not present

## 2013-06-08 DIAGNOSIS — Z124 Encounter for screening for malignant neoplasm of cervix: Secondary | ICD-10-CM | POA: Diagnosis not present

## 2013-06-08 DIAGNOSIS — Z1151 Encounter for screening for human papillomavirus (HPV): Secondary | ICD-10-CM | POA: Diagnosis not present

## 2013-06-08 DIAGNOSIS — Z1239 Encounter for other screening for malignant neoplasm of breast: Secondary | ICD-10-CM | POA: Diagnosis not present

## 2013-06-10 DIAGNOSIS — R300 Dysuria: Secondary | ICD-10-CM | POA: Diagnosis not present

## 2013-06-10 DIAGNOSIS — N764 Abscess of vulva: Secondary | ICD-10-CM | POA: Diagnosis not present

## 2013-06-10 DIAGNOSIS — M549 Dorsalgia, unspecified: Secondary | ICD-10-CM | POA: Diagnosis not present

## 2013-07-24 ENCOUNTER — Encounter: Payer: Self-pay | Admitting: Podiatry

## 2013-07-24 ENCOUNTER — Ambulatory Visit (INDEPENDENT_AMBULATORY_CARE_PROVIDER_SITE_OTHER): Payer: Medicare Other | Admitting: Podiatry

## 2013-07-24 VITALS — BP 122/77 | HR 56 | Resp 18

## 2013-07-24 DIAGNOSIS — B351 Tinea unguium: Secondary | ICD-10-CM

## 2013-07-24 DIAGNOSIS — M79609 Pain in unspecified limb: Secondary | ICD-10-CM | POA: Diagnosis not present

## 2013-07-24 NOTE — Progress Notes (Signed)
Patient ID: Jacqueline Reese, female   DOB: [birth year], 58 y.o.   MRN: [id number]  Subjective: Orientated x41 58 year old black female presents for ongoing debridement of painful mycotic toenails  Objective: Elongated, hypertrophic, discolored toenails x10  Assessment: Symptomatic onychomycoses x10   Plan: Debrided toenails x10 without any bleeding  Reappoint x3 months

## 2013-07-30 DIAGNOSIS — F259 Schizoaffective disorder, unspecified: Secondary | ICD-10-CM | POA: Diagnosis not present

## 2013-08-03 ENCOUNTER — Ambulatory Visit: Payer: Medicare Other | Admitting: Podiatry

## 2013-09-13 ENCOUNTER — Emergency Department (INDEPENDENT_AMBULATORY_CARE_PROVIDER_SITE_OTHER)
Admission: EM | Admit: 2013-09-13 | Discharge: 2013-09-13 | Disposition: A | Discharge status: Home / Self Care | Payer: Medicare Other | Source: Home / Self Care | Attending: Family Medicine | Admitting: Family Medicine

## 2013-09-13 DIAGNOSIS — M545 Low back pain, unspecified: Secondary | ICD-10-CM | POA: Diagnosis not present

## 2013-09-13 LAB — POCT URINALYSIS DIP (DEVICE)
BILIRUBIN URINE: NEGATIVE
Glucose, UA: NEGATIVE mg/dL
Hgb urine dipstick: NEGATIVE
KETONES UR: NEGATIVE mg/dL
LEUKOCYTES UA: NEGATIVE
Nitrite: NEGATIVE
PH: 5 (ref 5.0–8.0)
Protein, ur: NEGATIVE mg/dL
Urobilinogen, UA: 0.2 mg/dL (ref 0.0–1.0)

## 2013-09-13 MED ORDER — TRAMADOL HCL 50 MG PO TABS: 50.0000 mg | ORAL_TABLET | Freq: Four times a day (QID) | ORAL | Status: DC | PRN

## 2013-09-13 MED ORDER — METHYLPREDNISOLONE ACETATE 40 MG/ML IJ SUSP
40.0000 mg | Freq: Once | INTRAMUSCULAR | Status: AC
  Administered 2013-09-13: 40 mg via INTRAMUSCULAR

## 2013-09-13 MED ORDER — METHYLPREDNISOLONE ACETATE 40 MG/ML IJ SUSP
INTRAMUSCULAR | Status: AC
  Filled 2013-09-13: qty 5

## 2013-09-13 NOTE — ED Provider Notes (Signed)
Jacqueline Reese is a 58 y.o. female who presents to Urgent Care today for back pain. Patient has right-sided back pain radiating to the right knee. She denies any weakness or numbness bowel bladder dysfunction or difficulty walking. Pain is consistent with previous episodes of back pain. She's tried tramadol which helped a little. Previously a corticosteroid she has been helpful. She has an allergy to prednisone but is successfully able to take corticosteroid injections. No fevers or chills nausea vomiting or diarrhea.   Past Medical History  Diagnosis Date  . Diabetes mellitus without complication   . Schizophrenic disorder   . Hypertension   . Chronic back pain   . Gout   . Depression    History  Substance Use Topics  . Smoking status: Current Every Day Smoker -- 0.33 packs/day    Types: Cigarettes  . Smokeless tobacco: Never Used  . Alcohol Use: No   ROS as above Medications: No current facility-administered medications for this encounter.   Current Outpatient Prescriptions  Medication Sig Dispense Refill  . ADVAIR DISKUS 250-50 MCG/DOSE AEPB       . albuterol (PROVENTIL HFA;VENTOLIN HFA) 108 (90 BASE) MCG/ACT inhaler Inhale 2 puffs into the lungs every 4 (four) hours as needed for wheezing or shortness of breath.  1 Inhaler  0  . allopurinol (ZYLOPRIM) 100 MG tablet Take 200 mg by mouth daily.      Marland Kitchen aspirin (ASPIR-LOW) 81 MG EC tablet Take 81 mg by mouth daily.       Marland Kitchen buPROPion (WELLBUTRIN SR) 150 MG 12 hr tablet Take 150 mg by mouth daily. NO INSTRUCTIONS GIVEN (per mental health)      . clonazePAM (KLONOPIN) 1 MG tablet Take 0.5-1 mg by mouth 3 (three) times daily. Takes 1 mg in the morning and 0.5 mg in the evening and at  bedtime      . COLCRYS 0.6 MG tablet       . diclofenac (VOLTAREN) 75 MG EC tablet Take 75 mg by mouth 2 (two) times daily.      Marland Kitchen econazole nitrate 1 % cream Apply topically daily.  15 g  3  . escitalopram (LEXAPRO) 20 MG tablet Take 20 mg by mouth  daily.       . Fluticasone-Salmeterol (ADVAIR DISKUS) 250-50 MCG/DOSE AEPB Inhale 1 puff into the lungs every 12 (twelve) hours.  1 each  0  . glipiZIDE (GLUCOTROL XL) 2.5 MG 24 hr tablet Take 2.5 mg by mouth daily.      Marland Kitchen guaifenesin (ROBITUSSIN) 100 MG/5ML syrup Take 5-10 mLs (100-200 mg total) by mouth every 4 (four) hours as needed for cough.  60 mL  0  . haloperidol (HALDOL) 2 MG tablet Take 2 mg by mouth 2 (two) times daily. per psych      . hydrochlorothiazide 25 MG tablet Take 25 mg by mouth every morning.       Marland Kitchen HYDROcodone-acetaminophen (NORCO) 5-325 MG per tablet Take 1 tablet by mouth every 6 (six) hours as needed for pain.  10 tablet  0  . ibuprofen (ADVIL,MOTRIN) 800 MG tablet Take 800 mg by mouth every 8 (eight) hours as needed. For pain      . lisinopril (PRINIVIL,ZESTRIL) 10 MG tablet       . metFORMIN (GLUCOPHAGE) 1000 MG tablet Take 1,000 mg by mouth 2 (two) times daily with a meal.       . mirtazapine (REMERON) 15 MG tablet       .  omeprazole (PRILOSEC) 20 MG capsule Take 20 mg by mouth daily as needed. For heartburn      . PROAIR HFA 108 (90 BASE) MCG/ACT inhaler       . terbinafine (LAMISIL) 250 MG tablet Take 1 tablet (250 mg total) by mouth daily.  221 tablet  0  . traMADol (ULTRAM) 50 MG tablet Take 1 tablet (50 mg total) by mouth every 6 (six) hours as needed.  15 tablet  0  . traMADol-acetaminophen (ULTRACET) 37.5-325 MG per tablet       . ziprasidone (GEODON) 80 MG capsule Take 240 mg by mouth every evening. (per mental health)        Exam:  BP 136/86  Pulse 60  Temp(Src) 97.9 F (36.6 C) (Oral)  Resp 18  SpO2 97%  LMP 05/09/2010 Gen: Well NAD HEENT: EOMI,  MMM Lungs: Normal work of breathing. CTABL Heart: RRR no MRG Abd: NABS, Soft. NT, ND Exts: Brisk capillary refill, warm and well perfused.  Back: Nontender to spinal midline. Nontender SI joint. Flexion and extension are intact Motion and strength is intact throughout Reflexes are equal bilateral  lower extremities Normal gait. Negative straight leg raise test and Corky Sox test  Results for orders placed during the hospital encounter of 09/13/13 (from the past 24 hour(s))  POCT URINALYSIS DIP (DEVICE)     Status: None   Collection Time    09/13/13  9:26 AM      Result Value Ref Range   Glucose, UA NEGATIVE  NEGATIVE mg/dL   Bilirubin Urine NEGATIVE  NEGATIVE   Ketones, ur NEGATIVE  NEGATIVE mg/dL   Specific Gravity, Urine <=1.005  1.005 - 1.030   Hgb urine dipstick NEGATIVE  NEGATIVE   pH 5.0  5.0 - 8.0   Protein, ur NEGATIVE  NEGATIVE mg/dL   Urobilinogen, UA 0.2  0.0 - 1.0 mg/dL   Nitrite NEGATIVE  NEGATIVE   Leukocytes, UA NEGATIVE  NEGATIVE   No results found.  Assessment and Plan: 58 y.o. female with myofascial pain of the lumbar back. Plan to treat with Depo-Medrol injection as that worked previously as well a small amount of tramadol. Followup with primary care provider.  Discussed warning signs or symptoms. Please see discharge instructions. Patient expresses understanding.    Gregor Hams, MD 09/13/13 [phone]

## 2013-09-13 NOTE — ED Notes (Signed)
C/o back and right side pain for two weeks No heavy lifting Patient states she is disable No urinary problems

## 2013-09-13 NOTE — Discharge Instructions (Signed)
Thank you for coming in today. Take the tramadol as needed.  Check your blood sugar.  Come back as needed.  Come back or go to the emergency room if you notice new weakness new numbness problems walking or bowel or bladder problems.   Back Exercises Back exercises help treat and prevent back injuries. The goal is to increase your strength in your belly (abdominal) and back muscles. These exercises can also help with flexibility. Start these exercises when told by your doctor. HOME CARE Back exercises include: Pelvic Tilt.  Lie on your back with your knees bent. Tilt your pelvis until the lower part of your back is against the floor. Hold this position 5 to 10 sec. Repeat this exercise 5 to 10 times. Knee to Chest.  Pull 1 knee up against your chest and hold for 20 to 30 seconds. Repeat this with the other knee. This may be done with the other leg straight or bent, whichever feels better. Then, pull both knees up against your chest. Sit-Ups or Curl-Ups.  Bend your knees 90 degrees. Start with tilting your pelvis, and do a partial, slow sit-up. Only lift your upper half 30 to 45 degrees off the floor. Take at least 2 to 3 seonds for each sit-up. Do not do sit-ups with your knees out straight. If partial sit-ups are difficult, simply do the above but with only tightening your belly (abdominal) muscles and holding it as told. Hip-Lift.  Lie on your back with your knees flexed 90 degrees. Push down with your feet and shoulders as you raise your hips 2 inches off the floor. Hold for 10 seconds, repeat 5 to 10 times. Back Arches.  Lie on your stomach. Prop yourself up on bent elbows. Slowly press on your hands, causing an arch in your low back. Repeat 3 to 5 times. Shoulder-Lifts.  Lie face down with arms beside your body. Keep hips and belly pressed to floor as you slowly lift your head and shoulders off the floor. Do not overdo your exercises. Be careful in the beginning. Exercises may cause  you some mild back discomfort. If the pain lasts for more than 15 minutes, stop the exercises until you see your doctor. Improvement with exercise for back problems is slow.  Document Released: 03/24/2010 Document Revised: 05/14/2011 Document Reviewed: 12/21/2010 [facility] Patient Information 2015 [place], [place]. This information is not intended to replace advice given to you by your health care provider. Make sure you discuss any questions you have with your health care provider.

## 2013-09-27 ENCOUNTER — Encounter (HOSPITAL_COMMUNITY): Payer: Self-pay | Admitting: Emergency Medicine

## 2013-09-27 ENCOUNTER — Emergency Department (INDEPENDENT_AMBULATORY_CARE_PROVIDER_SITE_OTHER)
Admission: EM | Admit: 2013-09-27 | Discharge: 2013-09-27 | Disposition: A | Discharge status: Home / Self Care | Payer: Medicare Other | Source: Home / Self Care

## 2013-09-27 DIAGNOSIS — J45901 Unspecified asthma with (acute) exacerbation: Secondary | ICD-10-CM

## 2013-09-27 DIAGNOSIS — Z720 Tobacco use: Secondary | ICD-10-CM

## 2013-09-27 DIAGNOSIS — J4521 Mild intermittent asthma with (acute) exacerbation: Secondary | ICD-10-CM

## 2013-09-27 DIAGNOSIS — F17200 Nicotine dependence, unspecified, uncomplicated: Secondary | ICD-10-CM | POA: Diagnosis not present

## 2013-09-27 DIAGNOSIS — J302 Other seasonal allergic rhinitis: Secondary | ICD-10-CM

## 2013-09-27 DIAGNOSIS — J3089 Other allergic rhinitis: Secondary | ICD-10-CM

## 2013-09-27 MED ORDER — ALBUTEROL SULFATE (2.5 MG/3ML) 0.083% IN NEBU
INHALATION_SOLUTION | RESPIRATORY_TRACT | Status: AC
  Filled 2013-09-27: qty 3

## 2013-09-27 MED ORDER — ALBUTEROL SULFATE (2.5 MG/3ML) 0.083% IN NEBU
2.5000 mg | INHALATION_SOLUTION | Freq: Once | RESPIRATORY_TRACT | Status: AC
  Administered 2013-09-27: 2.5 mg via RESPIRATORY_TRACT

## 2013-09-27 MED ORDER — ALBUTEROL SULFATE HFA 108 (90 BASE) MCG/ACT IN AERS: 2.0000 | INHALATION_SPRAY | RESPIRATORY_TRACT | Status: DC | PRN

## 2013-09-27 MED ORDER — IPRATROPIUM-ALBUTEROL 0.5-2.5 (3) MG/3ML IN SOLN
RESPIRATORY_TRACT | Status: AC
  Filled 2013-09-27: qty 3

## 2013-09-27 MED ORDER — IPRATROPIUM-ALBUTEROL 0.5-2.5 (3) MG/3ML IN SOLN
3.0000 mL | Freq: Once | RESPIRATORY_TRACT | Status: AC
  Administered 2013-09-27: 3 mL via RESPIRATORY_TRACT

## 2013-09-27 MED ORDER — BECLOMETHASONE DIPROPIONATE 80 MCG/ACT IN AERS: 1.0000 | INHALATION_SPRAY | Freq: Two times a day (BID) | RESPIRATORY_TRACT | Status: DC

## 2013-09-27 NOTE — ED Notes (Signed)
Pt  Has  Symptoms  Of  Nasal  stuffyness      With  Cough  And  Congested    Some  Shortness  Of  Breath  Noted  As  Well  Pt is  Masked  And  Is  Sitting  Upright  On  Exam table    Speaking  In  Complete  sentances   Pt is  A  Smoker

## 2013-09-27 NOTE — ED Provider Notes (Signed)
CSN: [id number]     Arrival date & time 09/27/13  0901 History   First MD Initiated Contact with Patient 09/27/13 [phone]     No chief complaint on file.  (Consider location/radiation/quality/duration/timing/severity/associated sxs/prior Treatment) HPI Comments:  58 year old female complains of having a cold for 2 days. She is complaining of nasal and chest congestion. This is associated with shortness of breath. Denies chest pain or fever. She smokes one pack of cigarettes per week. She has taken cough syrup and cough drops without relief.   Past Medical History  Diagnosis Date  . Diabetes mellitus without complication   . Schizophrenic disorder   . Hypertension   . Chronic back pain   . Gout   . Depression    Past Surgical History  Procedure Laterality Date  . Cesarean section      x2   No family history on file. History  Substance Use Topics  . Smoking status: Current Every Day Smoker -- 0.33 packs/day    Types: Cigarettes  . Smokeless tobacco: Never Used  . Alcohol Use: No   OB History   Grav Para Term Preterm Abortions TAB SAB Ect Mult Living                 Review of Systems  Constitutional: Positive for activity change. Negative for fever, chills, appetite change and fatigue.  HENT: Positive for congestion, postnasal drip and rhinorrhea. Negative for facial swelling and sore throat.   Eyes: Negative.   Respiratory: Positive for cough and shortness of breath.   Cardiovascular: Negative.  Negative for chest pain.  Gastrointestinal: Negative.   Musculoskeletal: Negative for neck pain and neck stiffness.  Skin: Negative for pallor and rash.  Neurological: Negative.     Allergies  Chantix; Doxycycline; Lamisil; Prednisone; and Sulfa antibiotics  Home Medications   Prior to Admission medications   Medication Sig Start Date End Date Taking? Authorizing Provider  ADVAIR DISKUS 250-50 MCG/DOSE AEPB  02/12/13   Historical Provider, MD  albuterol (PROVENTIL  HFA;VENTOLIN HFA) 108 (90 BASE) MCG/ACT inhaler Inhale 2 puffs into the lungs every 4 (four) hours as needed for wheezing or shortness of breath. 05/09/13   Janne Napoleon, NP  albuterol (PROVENTIL HFA;VENTOLIN HFA) 108 (90 BASE) MCG/ACT inhaler Inhale 2 puffs into the lungs every 4 (four) hours as needed for wheezing or shortness of breath. 09/27/13   Janne Napoleon, NP  allopurinol (ZYLOPRIM) 100 MG tablet Take 200 mg by mouth daily.    Historical Provider, MD  aspirin (ASPIR-LOW) 81 MG EC tablet Take 81 mg by mouth daily.     Historical Provider, MD  beclomethasone (QVAR) 80 MCG/ACT inhaler Inhale 1 puff into the lungs 2 (two) times daily. 09/27/13   Janne Napoleon, NP  buPROPion [facility] SR) 150 MG 12 hr tablet Take 150 mg by mouth daily. NO INSTRUCTIONS GIVEN (per mental health)    Historical Provider, MD  clonazePAM (KLONOPIN) 1 MG tablet Take 0.5-1 mg by mouth 3 (three) times daily. Takes 1 mg in the morning and 0.5 mg in the evening and at  bedtime    Historical Provider, MD  COLCRYS 0.6 MG tablet  02/12/13   Historical Provider, MD  diclofenac (VOLTAREN) 75 MG EC tablet Take 75 mg by mouth 2 (two) times daily.    Historical Provider, MD  econazole nitrate 1 % cream Apply topically daily. 12/15/12   Richard Tuchman, DPM  escitalopram (LEXAPRO) 20 MG tablet Take 20 mg by mouth daily.  Historical Provider, MD  Fluticasone-Salmeterol (ADVAIR DISKUS) 250-50 MCG/DOSE AEPB Inhale 1 puff into the lungs every 12 (twelve) hours. 05/09/13   Janne Napoleon, NP  glipiZIDE (GLUCOTROL XL) 2.5 MG 24 hr tablet Take 2.5 mg by mouth daily.    Historical Provider, MD  guaifenesin (ROBITUSSIN) 100 MG/5ML syrup Take 5-10 mLs (100-200 mg total) by mouth every 4 (four) hours as needed for cough. 03/02/12   Jasper Riling. Pickering, MD  haloperidol (HALDOL) 2 MG tablet Take 2 mg by mouth 2 (two) times daily. per psych    Historical Provider, MD  hydrochlorothiazide 25 MG tablet Take 25 mg by mouth every morning.     Historical Provider,  MD  HYDROcodone-acetaminophen (NORCO) 5-325 MG per tablet Take 1 tablet by mouth every 6 (six) hours as needed for pain. 03/02/12   Jasper Riling. Pickering, MD  ibuprofen (ADVIL,MOTRIN) 800 MG tablet Take 800 mg by mouth every 8 (eight) hours as needed. For pain    Historical Provider, MD  lisinopril (PRINIVIL,ZESTRIL) 10 MG tablet  04/05/13   Historical Provider, MD  metFORMIN (GLUCOPHAGE) 1000 MG tablet Take 1,000 mg by mouth 2 (two) times daily with a meal.  01/06/11   Candelaria Celeste, MD  mirtazapine (REMERON) 15 MG tablet  02/03/13   Historical Provider, MD  omeprazole (PRILOSEC) 20 MG capsule Take 20 mg by mouth daily as needed. For heartburn    Historical Provider, MD  PROAIR HFA 108 (90 BASE) MCG/ACT inhaler  04/19/13   Historical Provider, MD  terbinafine (LAMISIL) 250 MG tablet Take 1 tablet (250 mg total) by mouth daily. 04/10/13   Trudie Buckler, DPM  traMADol (ULTRAM) 50 MG tablet Take 1 tablet (50 mg total) by mouth every 6 (six) hours as needed. 09/13/13   Gregor Hams, MD  traMADol-acetaminophen Caroline Sauger) 37.5-325 MG per tablet  04/06/13   Historical Provider, MD  ziprasidone (GEODON) 80 MG capsule Take 240 mg by mouth every evening. (per mental health)    Historical Provider, MD   BP 149/90  Pulse 87  Temp(Src) 97.3 F (36.3 C) (Oral)  SpO2 96%  LMP 05/09/2010 Physical Exam  Nursing note and vitals reviewed. Constitutional: She is oriented to person, place, and time. She appears well-developed and well-nourished. No distress.  HENT:  Mouth/Throat: No oropharyngeal exudate.  Bilat TM's nl OP with minor erythema .   Eyes: Conjunctivae and EOM are normal.  Neck: Normal range of motion. Neck supple.  Cardiovascular: Normal rate, regular rhythm and normal heart sounds.   Pulmonary/Chest: Effort normal. No respiratory distress. She has wheezes.  Prolonged expiratory phase Diffuse bilat wheezing and decrease air movement.  Musculoskeletal: Normal range of motion. She exhibits no edema.   Lymphadenopathy:    She has no cervical adenopathy.  Neurological: She is alert and oriented to person, place, and time.  Skin: Skin is warm and dry. No rash noted.  Psychiatric: She has a normal mood and affect.    ED Course  Procedures (including critical care time) Labs Review Labs Reviewed - No data to display  Imaging Review No results found.   MDM   1. RAD (reactive airway disease) with wheezing, mild intermittent, with acute exacerbation   2. Other seasonal allergic rhinitis   3. Tobacco abuse disorder     Post Duoneb modest improvement . Some decrease in wheezes and improved airmovement.  Repeat albuterol neb in 20 min.  Post 2nd neb with moderate improvement with air movement and further reduction of wheeze. Stable Zyrtec for  upper congestion and drainage Plenty of fluids.       Janne Napoleon, NP 09/27/13 1047

## 2013-09-27 NOTE — Discharge Instructions (Signed)
Allergic Rhinitis Continue your Zyrtec Allergic rhinitis is when the mucous membranes in the nose respond to allergens. Allergens are particles in the air that cause your body to have an allergic reaction. This causes you to release allergic antibodies. Through a chain of events, these eventually cause you to release histamine into the blood stream. Although meant to protect the body, it is this release of histamine that causes your discomfort, such as frequent sneezing, congestion, and an itchy, runny nose.  CAUSES  Seasonal allergic rhinitis (hay fever) is caused by pollen allergens that may come from grasses, trees, and weeds. Year-round allergic rhinitis (perennial allergic rhinitis) is caused by allergens such as house dust mites, pet dander, and mold spores.  SYMPTOMS   Nasal stuffiness (congestion).  Itchy, runny nose with sneezing and tearing of the eyes. DIAGNOSIS  Your health care provider can help you determine the allergen or allergens that trigger your symptoms. If you and your health care provider are unable to determine the allergen, skin or blood testing may be used. TREATMENT  Allergic rhinitis does not have a cure, but it can be controlled by:  Medicines and allergy shots (immunotherapy).  Avoiding the allergen. Hay fever may often be treated with antihistamines in pill or nasal spray forms. Antihistamines block the effects of histamine. There are over-the-counter medicines that may help with nasal congestion and swelling around the eyes. Check with your health care provider before taking or giving this medicine.  If avoiding the allergen or the medicine prescribed do not work, there are many new medicines your health care provider can prescribe. Stronger medicine may be used if initial measures are ineffective. Desensitizing injections can be used if medicine and avoidance does not work. Desensitization is when a patient is given ongoing shots until the body becomes less  sensitive to the allergen. Make sure you follow up with your health care provider if problems continue. HOME CARE INSTRUCTIONS It is not possible to completely avoid allergens, but you can reduce your symptoms by taking steps to limit your exposure to them. It helps to know exactly what you are allergic to so that you can avoid your specific triggers. SEEK MEDICAL CARE IF:   You have a fever.  You develop a cough that does not stop easily (persistent).  You have shortness of breath.  You start wheezing.  Symptoms interfere with normal daily activities. Document Released: 11/14/2000 Document Revised: 02/24/2013 Document Reviewed: 10/27/2012 [facility] Patient Information 2015 [place], [place]. This information is not intended to replace advice given to you by your health care provider. Make sure you discuss any questions you have with your health care provider.  Bronchospasm A bronchospasm is when the tubes that carry air in and out of your lungs (airways) spasm or tighten. During a bronchospasm it is hard to breathe. This is because the airways get smaller. A bronchospasm can be triggered by:  Allergies. These may be to animals, pollen, food, or mold.  Infection. This is a common cause of bronchospasm.  Exercise.  Irritants. These include pollution, cigarette smoke, strong odors, aerosol sprays, and paint fumes.  Weather changes.  Stress.  Being emotional. HOME CARE   Always have a plan for getting help. Know when to call your doctor and local emergency services (911 in the [place] Know where you can get emergency care.  Only take medicines as told by your doctor.  If you were prescribed an inhaler or nebulizer machine, ask your doctor how to use it correctly. Always  use a spacer with your inhaler if you were given one.  Stay calm during an attack. Try to relax and breathe more slowly.  Control your home environment:  Change your heating and air conditioning filter at least  once a month.  Limit your use of fireplaces and wood stoves.  Do not  smoke. Do not  allow smoking in your home.  Avoid perfumes and fragrances.  Get rid of pests (such as roaches and mice) and their droppings.  Throw away plants if you see mold on them.  Keep your house clean and dust free.  Replace carpet with wood, tile, or vinyl flooring. Carpet can trap dander and dust.  Use allergy-proof pillows, mattress covers, and box spring covers.  Wash bed sheets and blankets every week in hot water. Dry them in a dryer.  Use blankets that are made of polyester or cotton.  Wash hands frequently. GET HELP IF:  You have muscle aches.  You have chest pain.  The thick spit you spit or cough up (sputum) changes from clear or white to yellow, green, gray, or bloody.  The thick spit you spit or cough up gets thicker.  There are problems that may be related to the medicine you are given such as:  A rash.  Itching.  Swelling.  Trouble breathing. GET HELP RIGHT AWAY IF:  You feel you cannot breathe or catch your breath.  You cannot stop coughing.  Your treatment is not helping you breathe better.  You have very bad chest pain. MAKE SURE YOU:   Understand these instructions.  Will watch your condition.  Will get help right away if you are not doing well or get worse. Document Released: 12/17/2008 Document Revised: 02/24/2013 Document Reviewed: 08/12/2012 [facility] Patient Information 2015 [place], [place]. This information is not intended to replace advice given to you by your health care provider. Make sure you discuss any questions you have with your health care provider.  How to Use an Inhaler Proper inhaler technique is very important. Good technique ensures that the medicine reaches the lungs. Poor technique results in depositing the medicine on the tongue and back of the throat rather than in the airways. If you do not use the inhaler with good technique, the  medicine will not help you. STEPS TO FOLLOW IF USING AN INHALER WITHOUT AN EXTENSION TUBE 1. Remove the cap from the inhaler. 2. If you are using the inhaler for the first time, you will need to prime it. Shake the inhaler for 5 seconds and release four puffs into the air, away from your face. Ask your health care provider or pharmacist if you have questions about priming your inhaler. 3. Shake the inhaler for 5 seconds before each breath in (inhalation). 4. Position the inhaler so that the top of the canister faces up. 5. Put your index finger on the top of the medicine canister. Your thumb supports the bottom of the inhaler. 6. Open your mouth. 7. Either place the inhaler between your teeth and place your lips tightly around the mouthpiece, or hold the inhaler 1-2 inches away from your open mouth. If you are unsure of which technique to use, ask your health care provider. 8. Breathe out (exhale) normally and as completely as possible. 9. Press the canister down with your index finger to release the medicine. 10. At the same time as the canister is pressed, inhale deeply and slowly until your lungs are completely filled. This should take 4-6 seconds. Keep your  tongue down. 11. Hold the medicine in your lungs for 5-10 seconds (10 seconds is best). This helps the medicine get into the small airways of your lungs. 12. Breathe out slowly, through pursed lips. Whistling is an example of pursed lips. 13. Wait at least 15-30 seconds between puffs. Continue with the above steps until you have taken the number of puffs your health care provider has ordered. Do not use the inhaler more than your health care provider tells you. 14. Replace the cap on the inhaler. 15. Follow the directions from your health care provider or the inhaler insert for cleaning the inhaler. STEPS TO FOLLOW IF USING AN INHALER WITH AN EXTENSION (SPACER) 1. Remove the cap from the inhaler. 2. If you are using the inhaler for the  first time, you will need to prime it. Shake the inhaler for 5 seconds and release four puffs into the air, away from your face. Ask your health care provider or pharmacist if you have questions about priming your inhaler. 3. Shake the inhaler for 5 seconds before each breath in (inhalation). 4. Place the open end of the spacer onto the mouthpiece of the inhaler. 5. Position the inhaler so that the top of the canister faces up and the spacer mouthpiece faces you. 6. Put your index finger on the top of the medicine canister. Your thumb supports the bottom of the inhaler and the spacer. 7. Breathe out (exhale) normally and as completely as possible. 8. Immediately after exhaling, place the spacer between your teeth and into your mouth. Close your lips tightly around the spacer. 9. Press the canister down with your index finger to release the medicine. 10. At the same time as the canister is pressed, inhale deeply and slowly until your lungs are completely filled. This should take 4-6 seconds. Keep your tongue down and out of the way. 11. Hold the medicine in your lungs for 5-10 seconds (10 seconds is best). This helps the medicine get into the small airways of your lungs. Exhale. 12. Repeat inhaling deeply through the spacer mouthpiece. Again hold that breath for up to 10 seconds (10 seconds is best). Exhale slowly. If it is difficult to take this second deep breath through the spacer, breathe normally several times through the spacer. Remove the spacer from your mouth. 13. Wait at least 15-30 seconds between puffs. Continue with the above steps until you have taken the number of puffs your health care provider has ordered. Do not use the inhaler more than your health care provider tells you. 14. Remove the spacer from the inhaler, and place the cap on the inhaler. 15. Follow the directions from your health care provider or the inhaler insert for cleaning the inhaler and spacer. If you are using  different kinds of inhalers, use your quick relief medicine to open the airways 10-15 minutes before using a steroid if instructed to do so by your health care provider. If you are unsure which inhalers to use and the order of using them, ask your health care provider, nurse, or respiratory therapist. If you are using a steroid inhaler, always rinse your mouth with water after your last puff, then gargle and spit out the water. Do not swallow the water. AVOID:  Inhaling before or after starting the spray of medicine. It takes practice to coordinate your breathing with triggering the spray.  Inhaling through the nose (rather than the mouth) when triggering the spray. HOW TO DETERMINE IF YOUR INHALER IS FULL OR NEARLY  EMPTY You cannot know when an inhaler is empty by shaking it. A few inhalers are now being made with dose counters. Ask your health care provider for a prescription that has a dose counter if you feel you need that extra help. If your inhaler does not have a counter, ask your health care provider to help you determine the date you need to refill your inhaler. Write the refill date on a calendar or your inhaler canister. Refill your inhaler 7-10 days before it runs out. Be sure to keep an adequate supply of medicine. This includes making sure it is not expired, and that you have a spare inhaler.  SEEK MEDICAL CARE IF:   Your symptoms are only partially relieved with your inhaler.  You are having trouble using your inhaler.  You have some increase in phlegm. SEEK IMMEDIATE MEDICAL CARE IF:   You feel little or no relief with your inhalers. You are still wheezing and are feeling shortness of breath or tightness in your chest or both.  You have dizziness, headaches, or a fast heart rate.  You have chills, fever, or night sweats.  You have a noticeable increase in phlegm production, or there is blood in the phlegm. MAKE SURE YOU:   Understand these instructions.  Will watch your  condition.  Will get help right away if you are not doing well or get worse. Document Released: 02/17/2000 Document Revised: 12/10/2012 Document Reviewed: 09/18/2012 [facility] Patient Information 2015 [place] [employer], [place]. This information is not intended to replace advice given to you by your health care provider. Make sure you discuss any questions you have with your health care provider.  You Can Quit Smoking If you are ready to quit smoking or are thinking about it, congratulations! You have chosen to help yourself be healthier and live longer! There are lots of different ways to quit smoking. Nicotine gum, nicotine patches, a nicotine inhaler, or nicotine nasal spray can help with physical craving. Hypnosis, support groups, and medicines help break the habit of smoking. TIPS TO GET OFF AND STAY OFF CIGARETTES  Learn to predict your moods. Do not let a bad situation be your excuse to have a cigarette. Some situations in your life might tempt you to have a cigarette.  Ask friends and co-workers not to smoke around you.  Make your home smoke-free.  Never have "just one" cigarette. It leads to wanting another and another. Remind yourself of your decision to quit.  On a card, make a list of your reasons for not smoking. Read it at least the same number of times a day as you have a cigarette. Tell yourself everyday, "I do not want to smoke. I choose not to smoke."  Ask someone at home or work to help you with your plan to quit smoking.  Have something planned after you eat or have a cup of coffee. Take a walk or get other exercise to perk you up. This will help to keep you from overeating.  Try a relaxation exercise to calm you down and decrease your stress. Remember, you may be tense and nervous the first two weeks after you quit. This will pass.  Find new activities to keep your hands busy. Play with a pen, coin, or rubber band. Doodle or draw things on paper.  Brush your teeth right after  eating. This will help cut down the craving for the taste of tobacco after meals. You can try mouthwash too.  Try gum, breath mints, or diet  candy to keep something in your mouth. IF YOU SMOKE AND WANT TO QUIT:  Do not stock up on cigarettes. Never buy a carton. Wait until one pack is finished before you buy another.  Never carry cigarettes with you at work or at home.  Keep cigarettes as far away from you as possible. Leave them with someone else.  Never carry matches or a lighter with you.  Ask yourself, "Do I need this cigarette or is this just a reflex?"  Bet with someone that you can quit. Put cigarette money in a piggy bank every morning. If you smoke, you give up the money. If you do not smoke, by the end of the week, you keep the money.  Keep trying. It takes 21 days to change a habit!  Talk to your doctor about using medicines to help you quit. These include nicotine replacement gum, lozenges, or skin patches. Document Released: 12/16/2008 Document Revised: 05/14/2011 Document Reviewed: 12/16/2008 [facility] Patient Information 2015 [place], [place]. This information is not intended to replace advice given to you by your health care provider. Make sure you discuss any questions you have with your health care provider.

## 2013-10-01 NOTE — ED Provider Notes (Signed)
Medical screening examination/treatment/procedure(s) were performed by a resident physician or non-physician practitioner and as the supervising physician I was immediately available for consultation/collaboration.  Lynne Leader, MD    Gregor Hams, MD 10/01/13 [phone]

## 2013-10-02 ENCOUNTER — Emergency Department (INDEPENDENT_AMBULATORY_CARE_PROVIDER_SITE_OTHER)
Admission: EM | Admit: 2013-10-02 | Discharge: 2013-10-02 | Disposition: A | Discharge status: Home / Self Care | Payer: Medicare Other | Source: Home / Self Care | Attending: Emergency Medicine | Admitting: Emergency Medicine

## 2013-10-02 ENCOUNTER — Encounter (HOSPITAL_COMMUNITY): Payer: Self-pay | Admitting: Emergency Medicine

## 2013-10-02 DIAGNOSIS — J40 Bronchitis, not specified as acute or chronic: Secondary | ICD-10-CM

## 2013-10-02 MED ORDER — AZITHROMYCIN 250 MG PO TABS: ORAL_TABLET | ORAL | Status: DC

## 2013-10-02 MED ORDER — ALBUTEROL SULFATE 1.25 MG/3ML IN NEBU: 1.0000 | INHALATION_SOLUTION | Freq: Four times a day (QID) | RESPIRATORY_TRACT | Status: DC | PRN

## 2013-10-02 MED ORDER — IPRATROPIUM-ALBUTEROL 0.5-2.5 (3) MG/3ML IN SOLN
3.0000 mL | Freq: Once | RESPIRATORY_TRACT | Status: AC
  Administered 2013-10-02: 3 mL via RESPIRATORY_TRACT

## 2013-10-02 MED ORDER — IPRATROPIUM-ALBUTEROL 0.5-2.5 (3) MG/3ML IN SOLN
RESPIRATORY_TRACT | Status: AC
  Filled 2013-10-02: qty 3

## 2013-10-02 NOTE — Discharge Instructions (Signed)
You have mild bronchitis. Keep using the zyrtec and QVAR. Take azithromycin (an antibiotic) as directed on the bottle.  Follow up with your regular doctor in 1-2 weeks.

## 2013-10-02 NOTE — ED Notes (Signed)
Patient c/o persistent cough x 1 week. Cough is productive with mucous. Patient reports she was seen and treated on 7/26 for SOB. Patient reports tx made her feel better but she still has a cough. Patient is alert and oriented and in no acute distress.

## 2013-10-02 NOTE — ED Provider Notes (Signed)
CSN: [id number]     Arrival date & time 10/02/13  1752 History   First MD Initiated Contact with Patient 10/02/13 1758     Chief Complaint  Patient presents with  . Cough   (Consider location/radiation/quality/duration/timing/severity/associated sxs/prior Treatment) Patient is a 58 y.o. female presenting with cough.  Cough Associated symptoms: shortness of breath and wheezing   Associated symptoms: no chills and no fever    She is here today for evaluation of cough. She was seen 5 days ago for these symptoms. At that time, she received some breathing treatments in the office and was discharged on Zyrtec. She states she is feeling a little bit better. However, continues to have cough, wheezing, congestion. The cough is productive of greenish mucus. She states her shortness of breath is improved. No fevers. She is tolerating by mouth well. Her albuterol inhaler at home helps some. She is a current smoker, states she is not ready to quit.  Past Medical History  Diagnosis Date  . Diabetes mellitus without complication   . Schizophrenic disorder   . Hypertension   . Chronic back pain   . Gout   . Depression    Past Surgical History  Procedure Laterality Date  . Cesarean section      x2   No family history on file. History  Substance Use Topics  . Smoking status: Current Every Day Smoker -- 0.33 packs/day    Types: Cigarettes  . Smokeless tobacco: Never Used  . Alcohol Use: No   OB History   Grav Para Term Preterm Abortions TAB SAB Ect Mult Living                 Review of Systems  Constitutional: Negative for fever and chills.  HENT: Positive for congestion.   Respiratory: Positive for cough, shortness of breath and wheezing.   Cardiovascular: Negative.   Gastrointestinal: Negative.   Neurological: Negative.     Allergies  Chantix; Doxycycline; Lamisil; Prednisone; and Sulfa antibiotics  Home Medications   Prior to Admission medications   Medication Sig Start Date  End Date Taking? Authorizing Provider  ADVAIR DISKUS 250-50 MCG/DOSE AEPB  02/12/13   Historical Provider, MD  albuterol (ACCUNEB) 1.25 MG/3ML nebulizer solution Take 3 mLs (1.25 mg total) by nebulization every 6 (six) hours as needed for wheezing. 10/02/13   Melony Overly, MD  albuterol (PROVENTIL HFA;VENTOLIN HFA) 108 (90 BASE) MCG/ACT inhaler Inhale 2 puffs into the lungs every 4 (four) hours as needed for wheezing or shortness of breath. 05/09/13   Janne Napoleon, NP  albuterol (PROVENTIL HFA;VENTOLIN HFA) 108 (90 BASE) MCG/ACT inhaler Inhale 2 puffs into the lungs every 4 (four) hours as needed for wheezing or shortness of breath. 09/27/13   Janne Napoleon, NP  allopurinol (ZYLOPRIM) 100 MG tablet Take 200 mg by mouth daily.    Historical Provider, MD  aspirin (ASPIR-LOW) 81 MG EC tablet Take 81 mg by mouth daily.     Historical Provider, MD  azithromycin (ZITHROMAX Z-PAK) 250 MG tablet Take 2 tablets today, then 1 tablet daily until gone. 10/02/13   Melony Overly, MD  beclomethasone (QVAR) 80 MCG/ACT inhaler Inhale 1 puff into the lungs 2 (two) times daily. 09/27/13   Janne Napoleon, NP  buPROPion [facility] SR) 150 MG 12 hr tablet Take 150 mg by mouth daily. NO INSTRUCTIONS GIVEN (per mental health)    Historical Provider, MD  clonazePAM (KLONOPIN) 1 MG tablet Take 0.5-1 mg by mouth 3 (three) times daily.  Takes 1 mg in the morning and 0.5 mg in the evening and at  bedtime    Historical Provider, MD  COLCRYS 0.6 MG tablet  02/12/13   Historical Provider, MD  diclofenac (VOLTAREN) 75 MG EC tablet Take 75 mg by mouth 2 (two) times daily.    Historical Provider, MD  econazole nitrate 1 % cream Apply topically daily. 12/15/12   Richard Tuchman, DPM  escitalopram (LEXAPRO) 20 MG tablet Take 20 mg by mouth daily.     Historical Provider, MD  Fluticasone-Salmeterol (ADVAIR DISKUS) 250-50 MCG/DOSE AEPB Inhale 1 puff into the lungs every 12 (twelve) hours. 05/09/13   Janne Napoleon, NP  glipiZIDE (GLUCOTROL XL) 2.5 MG 24 hr  tablet Take 2.5 mg by mouth daily.    Historical Provider, MD  guaifenesin (ROBITUSSIN) 100 MG/5ML syrup Take 5-10 mLs (100-200 mg total) by mouth every 4 (four) hours as needed for cough. 03/02/12   Jasper Riling. Pickering, MD  haloperidol (HALDOL) 2 MG tablet Take 2 mg by mouth 2 (two) times daily. per psych    Historical Provider, MD  hydrochlorothiazide 25 MG tablet Take 25 mg by mouth every morning.     Historical Provider, MD  HYDROcodone-acetaminophen (NORCO) 5-325 MG per tablet Take 1 tablet by mouth every 6 (six) hours as needed for pain. 03/02/12   Jasper Riling. Pickering, MD  ibuprofen (ADVIL,MOTRIN) 800 MG tablet Take 800 mg by mouth every 8 (eight) hours as needed. For pain    Historical Provider, MD  lisinopril (PRINIVIL,ZESTRIL) 10 MG tablet  04/05/13   Historical Provider, MD  metFORMIN (GLUCOPHAGE) 1000 MG tablet Take 1,000 mg by mouth 2 (two) times daily with a meal.  01/06/11   Candelaria Celeste, MD  mirtazapine (REMERON) 15 MG tablet  02/03/13   Historical Provider, MD  omeprazole (PRILOSEC) 20 MG capsule Take 20 mg by mouth daily as needed. For heartburn    Historical Provider, MD  PROAIR HFA 108 (90 BASE) MCG/ACT inhaler  04/19/13   Historical Provider, MD  terbinafine (LAMISIL) 250 MG tablet Take 1 tablet (250 mg total) by mouth daily. 04/10/13   Trudie Buckler, DPM  traMADol (ULTRAM) 50 MG tablet Take 1 tablet (50 mg total) by mouth every 6 (six) hours as needed. 09/13/13   Gregor Hams, MD  traMADol-acetaminophen Caroline Sauger) 37.5-325 MG per tablet  04/06/13   Historical Provider, MD  ziprasidone (GEODON) 80 MG capsule Take 240 mg by mouth every evening. (per mental health)    Historical Provider, MD   BP 125/83  Pulse 81  Temp(Src) 98.1 F (36.7 C) (Oral)  Resp 22  Ht 5\' 4"  (1.626 m)  Wt 227 lb (102.967 kg)  BMI 38.95 kg/m2  SpO2 92%  LMP 05/09/2010 Physical Exam  Constitutional: She is oriented to person, place, and time. She appears well-developed and well-nourished. No distress.  HENT:   Head: Normocephalic and atraumatic.  Neck: Normal range of motion.  Cardiovascular: Normal rate, regular rhythm and normal heart sounds.   No murmur heard. Pulmonary/Chest: Effort normal. No respiratory distress. She has wheezes. She has no rales.  Scattered wheezes and rhonchi  Neurological: She is alert and oriented to person, place, and time.  Skin: Skin is warm and dry. No rash noted. She is not diaphoretic.    ED Course  Procedures (including critical care time) Labs Review Labs Reviewed - No data to display  Imaging Review No results found.   MDM   1. Bronchitis    Given scattered wheezing  and rhonchi, will treat with a DuoNeb nebulizer. She is afebrile and O2 sat is normal.  Lung sounds improved after DuoNeb. Does still have occasional wheeze. With persistent symptoms and smoking history, will treat for bronchitis with a Z-Pak. Recommended continuing Zyrtec and Qvar. Followup with regular doctor in one to 2 weeks.    Melony Overly, MD 10/02/13 1902

## 2013-10-08 DIAGNOSIS — M79609 Pain in unspecified limb: Secondary | ICD-10-CM | POA: Diagnosis not present

## 2013-10-08 DIAGNOSIS — I10 Essential (primary) hypertension: Secondary | ICD-10-CM | POA: Diagnosis not present

## 2013-10-08 DIAGNOSIS — E781 Pure hyperglyceridemia: Secondary | ICD-10-CM | POA: Diagnosis not present

## 2013-10-08 DIAGNOSIS — E119 Type 2 diabetes mellitus without complications: Secondary | ICD-10-CM | POA: Diagnosis not present

## 2013-10-08 DIAGNOSIS — J069 Acute upper respiratory infection, unspecified: Secondary | ICD-10-CM | POA: Diagnosis not present

## 2013-10-28 ENCOUNTER — Ambulatory Visit (INDEPENDENT_AMBULATORY_CARE_PROVIDER_SITE_OTHER): Payer: Medicare Other | Admitting: Podiatry

## 2013-10-28 DIAGNOSIS — B351 Tinea unguium: Secondary | ICD-10-CM

## 2013-10-28 DIAGNOSIS — M79609 Pain in unspecified limb: Secondary | ICD-10-CM | POA: Diagnosis not present

## 2013-10-28 DIAGNOSIS — M79676 Pain in unspecified toe(s): Secondary | ICD-10-CM

## 2013-10-28 NOTE — Progress Notes (Signed)
   Subjective:    Patient ID: Jacqueline Reese, female    DOB: [birth year], 58 y.o.   MRN: [id number]  HPI Pt presents for nail debridement She is complaining of painful toenails when walking and wearing shoes and  Review of Systems     Objective:   Physical Exam  The toenails are elongated, hypertrophic, incurvated and discolored 6-10      Assessment & Plan:   Assessment: Symptomatic onychomycoses 6-10  Plan: Nails x10 are debrided without any bleeding  Reappoint x3 months

## 2013-12-06 DIAGNOSIS — Z23 Encounter for immunization: Secondary | ICD-10-CM | POA: Diagnosis not present

## 2013-12-18 ENCOUNTER — Other Ambulatory Visit: Payer: Self-pay | Admitting: Internal Medicine

## 2013-12-18 DIAGNOSIS — E139 Other specified diabetes mellitus without complications: Secondary | ICD-10-CM | POA: Diagnosis not present

## 2013-12-18 DIAGNOSIS — R0989 Other specified symptoms and signs involving the circulatory and respiratory systems: Secondary | ICD-10-CM | POA: Diagnosis not present

## 2013-12-18 DIAGNOSIS — K219 Gastro-esophageal reflux disease without esophagitis: Secondary | ICD-10-CM | POA: Diagnosis not present

## 2013-12-18 DIAGNOSIS — R600 Localized edema: Secondary | ICD-10-CM | POA: Diagnosis not present

## 2013-12-18 DIAGNOSIS — M160 Bilateral primary osteoarthritis of hip: Secondary | ICD-10-CM | POA: Diagnosis not present

## 2013-12-28 ENCOUNTER — Other Ambulatory Visit: Payer: Medicare Other

## 2014-01-15 ENCOUNTER — Ambulatory Visit
Admission: RE | Admit: 2014-01-15 | Discharge: 2014-01-15 | Disposition: A | Discharge status: Home / Self Care | Payer: Medicare Other | Source: Ambulatory Visit | Attending: Internal Medicine | Admitting: Internal Medicine

## 2014-01-15 ENCOUNTER — Encounter (INDEPENDENT_AMBULATORY_CARE_PROVIDER_SITE_OTHER): Payer: Self-pay

## 2014-01-15 DIAGNOSIS — R0989 Other specified symptoms and signs involving the circulatory and respiratory systems: Secondary | ICD-10-CM

## 2014-01-15 DIAGNOSIS — M79604 Pain in right leg: Secondary | ICD-10-CM | POA: Diagnosis not present

## 2014-01-20 ENCOUNTER — Ambulatory Visit: Payer: Medicare Other | Admitting: Podiatry

## 2014-01-27 ENCOUNTER — Ambulatory Visit: Payer: Medicare Other | Admitting: Podiatry

## 2014-01-27 ENCOUNTER — Ambulatory Visit (INDEPENDENT_AMBULATORY_CARE_PROVIDER_SITE_OTHER): Payer: Medicare Other | Admitting: Podiatry

## 2014-01-27 ENCOUNTER — Encounter: Payer: Self-pay | Admitting: Podiatry

## 2014-01-27 VITALS — BP 161/89 | HR 60 | Resp 12

## 2014-01-27 DIAGNOSIS — B351 Tinea unguium: Secondary | ICD-10-CM | POA: Diagnosis not present

## 2014-01-27 DIAGNOSIS — M79676 Pain in unspecified toe(s): Secondary | ICD-10-CM

## 2014-01-27 NOTE — Patient Instructions (Signed)
Contact your MD to evaluate the swelling in your feet/ankles

## 2014-01-27 NOTE — Progress Notes (Signed)
Patient ID: Jacqueline Reese, female   DOB: May 26, 1955, 58 y.o.   MRN: [id number]  Subjective: Patient presents again complaining of painful toenails. Also, she mentions that she's having swelling in her feet and taking a fluid pill.  Objective: Mild pitting edema noted bilaterally DP and PT pulses 2/4 bilaterally The toenails are hypertrophic, incurvated, discolored 6-10  Assessment: Edema that needs further evaluation Symptomatic onychomycoses 6-10  Plan: Debrided toenails 10 without a bleeding Patient advised to contact primary care physician to evaluate edema and elevated blood pressure today  Reappoint 3 months

## 2014-01-31 ENCOUNTER — Encounter (HOSPITAL_COMMUNITY): Payer: Self-pay | Admitting: *Deleted

## 2014-01-31 ENCOUNTER — Emergency Department (HOSPITAL_COMMUNITY): Payer: Medicare Other

## 2014-01-31 ENCOUNTER — Emergency Department (HOSPITAL_COMMUNITY)
Admission: EM | Admit: 2014-01-31 | Discharge: 2014-01-31 | Disposition: A | Discharge status: Home / Self Care | Payer: Medicare Other | Attending: Emergency Medicine | Admitting: Emergency Medicine

## 2014-01-31 DIAGNOSIS — J811 Chronic pulmonary edema: Secondary | ICD-10-CM | POA: Diagnosis not present

## 2014-01-31 DIAGNOSIS — E119 Type 2 diabetes mellitus without complications: Secondary | ICD-10-CM | POA: Diagnosis not present

## 2014-01-31 DIAGNOSIS — I10 Essential (primary) hypertension: Secondary | ICD-10-CM | POA: Diagnosis not present

## 2014-01-31 DIAGNOSIS — F329 Major depressive disorder, single episode, unspecified: Secondary | ICD-10-CM | POA: Insufficient documentation

## 2014-01-31 DIAGNOSIS — Z79899 Other long term (current) drug therapy: Secondary | ICD-10-CM | POA: Insufficient documentation

## 2014-01-31 DIAGNOSIS — Z7951 Long term (current) use of inhaled steroids: Secondary | ICD-10-CM | POA: Insufficient documentation

## 2014-01-31 DIAGNOSIS — G8929 Other chronic pain: Secondary | ICD-10-CM | POA: Diagnosis not present

## 2014-01-31 DIAGNOSIS — R2243 Localized swelling, mass and lump, lower limb, bilateral: Secondary | ICD-10-CM | POA: Diagnosis not present

## 2014-01-31 DIAGNOSIS — M109 Gout, unspecified: Secondary | ICD-10-CM | POA: Diagnosis not present

## 2014-01-31 DIAGNOSIS — Z7982 Long term (current) use of aspirin: Secondary | ICD-10-CM | POA: Insufficient documentation

## 2014-01-31 DIAGNOSIS — M7989 Other specified soft tissue disorders: Secondary | ICD-10-CM | POA: Diagnosis not present

## 2014-01-31 DIAGNOSIS — J9801 Acute bronchospasm: Secondary | ICD-10-CM | POA: Diagnosis not present

## 2014-01-31 DIAGNOSIS — Z720 Tobacco use: Secondary | ICD-10-CM | POA: Diagnosis not present

## 2014-01-31 DIAGNOSIS — F209 Schizophrenia, unspecified: Secondary | ICD-10-CM | POA: Insufficient documentation

## 2014-01-31 LAB — CBC WITH DIFFERENTIAL/PLATELET
BASOS ABS: 0 10*3/uL (ref 0.0–0.1)
Basophils Relative: 0 % (ref 0–1)
EOS PCT: 4 % (ref 0–5)
Eosinophils Absolute: 0.3 10*3/uL (ref 0.0–0.7)
HEMATOCRIT: 37.9 % (ref 36.0–46.0)
Hemoglobin: 12.7 g/dL (ref 12.0–15.0)
LYMPHS ABS: 3 10*3/uL (ref 0.7–4.0)
LYMPHS PCT: 38 % (ref 12–46)
MCH: 29.9 pg (ref 26.0–34.0)
MCHC: 33.5 g/dL (ref 30.0–36.0)
MCV: 89.2 fL (ref 78.0–100.0)
Monocytes Absolute: 0.4 10*3/uL (ref 0.1–1.0)
Monocytes Relative: 6 % (ref 3–12)
Neutro Abs: 4 10*3/uL (ref 1.7–7.7)
Neutrophils Relative %: 52 % (ref 43–77)
Platelets: 289 10*3/uL (ref 150–400)
RBC: 4.25 MIL/uL (ref 3.87–5.11)
RDW: 14.5 % (ref 11.5–15.5)
WBC: 7.8 10*3/uL (ref 4.0–10.5)

## 2014-01-31 LAB — PRO B NATRIURETIC PEPTIDE: Pro B Natriuretic peptide (BNP): 213.1 pg/mL — ABNORMAL HIGH (ref 0–125)

## 2014-01-31 LAB — BASIC METABOLIC PANEL
Anion gap: 12 (ref 5–15)
BUN: 7 mg/dL (ref 6–23)
CALCIUM: 9 mg/dL (ref 8.4–10.5)
CO2: 27 meq/L (ref 19–32)
Chloride: 101 mEq/L (ref 96–112)
Creatinine, Ser: 0.65 mg/dL (ref 0.50–1.10)
GFR calc Af Amer: >90 mL/min (ref >90)
GFR calc non Af Amer: >90 mL/min (ref >90)
GLUCOSE: 88 mg/dL (ref 70–99)
Potassium: 3.8 mEq/L (ref 3.7–5.3)
Sodium: 140 mEq/L (ref 137–147)

## 2014-01-31 MED ORDER — IPRATROPIUM-ALBUTEROL 0.5-2.5 (3) MG/3ML IN SOLN
3.0000 mL | Freq: Once | RESPIRATORY_TRACT | Status: AC
  Administered 2014-01-31: 3 mL via RESPIRATORY_TRACT
  Filled 2014-01-31: qty 3

## 2014-01-31 MED ORDER — ALBUTEROL SULFATE HFA 108 (90 BASE) MCG/ACT IN AERS
1.0000 | INHALATION_SPRAY | Freq: Once | RESPIRATORY_TRACT | Status: AC
  Administered 2014-01-31: 1 via RESPIRATORY_TRACT
  Filled 2014-01-31: qty 6.7

## 2014-01-31 NOTE — ED Notes (Signed)
Pt states her feet have been swelling for the past 2 weeks. States that her doctor started her on a fluid pill the Tuesday before thanksgiving. States she hasnt noticed any change in swelling. States she did pray over them and anoint them thinking that would help. States her feet have been swelling off and on for months. States she sees her pmd on feb 6.

## 2014-01-31 NOTE — Discharge Instructions (Signed)
Bronchospasm A bronchospasm is a spasm or tightening of the airways going into the lungs. During a bronchospasm breathing becomes more difficult because the airways get smaller. When this happens there can be coughing, a whistling sound when breathing (wheezing), and difficulty breathing. Bronchospasm is often associated with asthma, but not all patients who experience a bronchospasm have asthma. CAUSES  A bronchospasm is caused by inflammation or irritation of the airways. The inflammation or irritation may be triggered by:   Allergies (such as to animals, pollen, food, or mold). Allergens that cause bronchospasm may cause wheezing immediately after exposure or many hours later.   Infection. Viral infections are believed to be the most common cause of bronchospasm.   Exercise.   Irritants (such as pollution, cigarette smoke, strong odors, aerosol sprays, and paint fumes).   Weather changes. Winds increase molds and pollens in the air. Rain refreshes the air by washing irritants out. Cold air may cause inflammation.   Stress and emotional upset.  SIGNS AND SYMPTOMS   Wheezing.   Excessive nighttime coughing.   Frequent or severe coughing with a simple cold.   Chest tightness.   Shortness of breath.  DIAGNOSIS  Bronchospasm is usually diagnosed through a history and physical exam. Tests, such as chest X-rays, are sometimes done to look for other conditions. TREATMENT   Inhaled medicines can be given to open up your airways and help you breathe. The medicines can be given using either an inhaler or a nebulizer machine.  Corticosteroid medicines may be given for severe bronchospasm, usually when it is associated with asthma. HOME CARE INSTRUCTIONS   Always have a plan prepared for seeking medical care. Know when to call your health care provider and local emergency services (911 in the [place] Know where you can access local emergency care.  Only take medicines as  directed by your health care provider.  If you were prescribed an inhaler or nebulizer machine, ask your health care provider to explain how to use it correctly. Always use a spacer with your inhaler if you were given one.  It is necessary to remain calm during an attack. Try to relax and breathe more slowly.  Control your home environment in the following ways:   Change your heating and air conditioning filter at least once a month.   Limit your use of fireplaces and wood stoves.  Do not smoke and do not allow smoking in your home.   Avoid exposure to perfumes and fragrances.   Get rid of pests (such as roaches and mice) and their droppings.   Throw away plants if you see mold on them.   Keep your house clean and dust free.   Replace carpet with wood, tile, or vinyl flooring. Carpet can trap dander and dust.   Use allergy-proof pillows, mattress covers, and box spring covers.   Wash bed sheets and blankets every week in hot water and dry them in a dryer.   Use blankets that are made of polyester or cotton.   Wash hands frequently. SEEK MEDICAL CARE IF:   You have muscle aches.   You have chest pain.   The sputum changes from clear or white to yellow, green, gray, or bloody.   The sputum you cough up gets thicker.   There are problems that may be related to the medicine you are given, such as a rash, itching, swelling, or trouble breathing.  SEEK IMMEDIATE MEDICAL CARE IF:   You have worsening wheezing and coughing even  after taking your prescribed medicines.   You have increased difficulty breathing.   You develop severe chest pain. MAKE SURE YOU:   Understand these instructions.  Will watch your condition.  Will get help right away if you are not doing well or get worse. Document Released: 02/22/2003 Document Revised: 02/24/2013 Document Reviewed: 08/11/2012 [facility] Patient Information 2015 [place], [place]. This information is not  intended to replace advice given to you by your health care provider. Make sure you discuss any questions you have with your health care provider.  Edema Edema is an abnormal buildup of fluids in your bodytissues. Edema is somewhatdependent on gravity to pull the fluid to the lowest place in your body. That makes the condition more common in the legs and thighs (lower extremities). Painless swelling of the feet and ankles is common and becomes more likely as you get older. It is also common in looser tissues, like around your eyes.  When the affected area is squeezed, the fluid may move out of that spot and leave a dent for a few moments. This dent is called pitting.  CAUSES  There are many possible causes of edema. Eating too much salt and being on your feet or sitting for a long time can cause edema in your legs and ankles. Hot weather may make edema worse. Common medical causes of edema include:  Heart failure.  Liver disease.  Kidney disease.  Weak blood vessels in your legs.  Cancer.  An injury.  Pregnancy.  Some medications.  Obesity. SYMPTOMS  Edema is usually painless.Your skin may look swollen or shiny.  DIAGNOSIS  Your health care provider may be able to diagnose edema by asking about your medical history and doing a physical exam. You may need to have tests such as X-rays, an electrocardiogram, or blood tests to check for medical conditions that may cause edema.  TREATMENT  Edema treatment depends on the cause. If you have heart, liver, or kidney disease, you need the treatment appropriate for these conditions. General treatment may include:  Elevation of the affected body part above the level of your heart.  Compression of the affected body part. Pressure from elastic bandages or support stockings squeezes the tissues and forces fluid back into the blood vessels. This keeps fluid from entering the tissues.  Restriction of fluid and salt intake.  Use of a water  pill (diuretic). These medications are appropriate only for some types of edema. They pull fluid out of your body and make you urinate more often. This gets rid of fluid and reduces swelling, but diuretics can have side effects. Only use diuretics as directed by your health care provider. HOME CARE INSTRUCTIONS   Keep the affected body part above the level of your heart when you are lying down.   Do not sit still or stand for prolonged periods.   Do not put anything directly under your knees when lying down.  Do not wear constricting clothing or garters on your upper legs.   Exercise your legs to work the fluid back into your blood vessels. This may help the swelling go down.   Wear elastic bandages or support stockings to reduce ankle swelling as directed by your health care provider.   Eat a low-salt diet to reduce fluid if your health care provider recommends it.   Only take medicines as directed by your health care provider. SEEK MEDICAL CARE IF:   Your edema is not responding to treatment.  You have heart, liver,  or kidney disease and notice symptoms of edema.  You have edema in your legs that does not improve after elevating them.   You have sudden and unexplained weight gain. SEEK IMMEDIATE MEDICAL CARE IF:   You develop shortness of breath or chest pain.   You cannot breathe when you lie down.  You develop pain, redness, or warmth in the swollen areas.   You have heart, liver, or kidney disease and suddenly get edema.  You have a fever and your symptoms suddenly get worse. MAKE SURE YOU:   Understand these instructions.  Will watch your condition.  Will get help right away if you are not doing well or get worse. Document Released: 02/19/2005 Document Revised: 07/06/2013 Document Reviewed: 12/12/2012 [facility] Patient Information 2015 [place], [place]. This information is not intended to replace advice given to you by your health care provider. Make sure  you discuss any questions you have with your health care provider.

## 2014-01-31 NOTE — ED Provider Notes (Signed)
CSN: [id number]     Arrival date & time 01/31/14  0848 History   First MD Initiated Contact with Patient 01/31/14 [id number]     Chief Complaint  Patient presents with  . Foot Swelling   HPI  Patient is a 58 year old female who presents emergency room for bilateral foot swelling 1 month. Patient states that she has had intermittent bilateral foot swelling for the past month. She says that she went to go and see her primary care doctor who prescribed her spironolactone for leg swelling. She says that it took a while for the medicine to work but it did work for a little while. She ran out of the medication and recently got it refilled but is complaining of worsening leg swelling. She states that her podiatrist recommended that she go have further evaluation for high blood pressure and leg swelling. She states that her legs sometimes hurt and her achy because of the leg swelling. She states that she is trying to walk more often but she can't due to the pain she is taking ibuprofen for the pain. She does not wear any compression stockings. She states that she has been drinking plenty of water but "needs a medication to help her P out the fluid". Patient denies any chest pain, shortness of breath, PND, and orthopnea.   Past Medical History  Diagnosis Date  . Diabetes mellitus without complication   . Schizophrenic disorder   . Hypertension   . Chronic back pain   . Gout   . Depression    Past Surgical History  Procedure Laterality Date  . Cesarean section      x2   History reviewed. No pertinent family history. History  Substance Use Topics  . Smoking status: Current Every Day Smoker -- 0.33 packs/day    Types: Cigarettes  . Smokeless tobacco: Never Used  . Alcohol Use: No   OB History    No data available     Review of Systems  Constitutional: Negative for fever, chills and fatigue.  Respiratory: Positive for cough and wheezing. Negative for chest tightness and shortness of breath.    Cardiovascular: Positive for leg swelling. Negative for chest pain and palpitations.  Gastrointestinal: Negative for nausea, vomiting, abdominal pain, diarrhea, constipation, blood in stool and anal bleeding.  Genitourinary: Negative for dysuria, urgency, hematuria and difficulty urinating.  Skin: Negative for rash.  All other systems reviewed and are negative.     Allergies  Chantix; Doxycycline; Lamisil; Prednisone; and Sulfa antibiotics  Home Medications   Prior to Admission medications   Medication Sig Start Date End Date Taking? Authorizing Provider  albuterol (ACCUNEB) 1.25 MG/3ML nebulizer solution Take 3 mLs (1.25 mg total) by nebulization every 6 (six) hours as needed for wheezing. 10/02/13  Yes Melony Overly, MD  albuterol (PROVENTIL HFA;VENTOLIN HFA) 108 (90 BASE) MCG/ACT inhaler Inhale 2 puffs into the lungs every 4 (four) hours as needed for wheezing or shortness of breath. 05/09/13  Yes Janne Napoleon, NP  allopurinol (ZYLOPRIM) 100 MG tablet Take 200 mg by mouth daily.   Yes Historical Provider, MD  aspirin (ASPIR-LOW) 81 MG EC tablet Take 81 mg by mouth daily.    Yes Historical Provider, MD  buPROPion (WELLBUTRIN SR) 150 MG 12 hr tablet Take 150 mg by mouth daily. NO INSTRUCTIONS GIVEN (per mental health)   Yes Historical Provider, MD  clonazePAM (KLONOPIN) 1 MG tablet Take 0.5-1 mg by mouth 3 (three) times daily. Takes 1 mg in the morning and  0.5 mg in the evening and at  bedtime   Yes Historical Provider, MD  escitalopram (LEXAPRO) 20 MG tablet Take 20 mg by mouth daily.    Yes Historical Provider, MD  glipiZIDE (GLUCOTROL XL) 2.5 MG 24 hr tablet Take 2.5 mg by mouth daily.   Yes Historical Provider, MD  haloperidol (HALDOL) 2 MG tablet Take 2 mg by mouth 2 (two) times daily. per psych   Yes Historical Provider, MD  ibuprofen (ADVIL,MOTRIN) 800 MG tablet Take 800 mg by mouth every 8 (eight) hours as needed. For pain   Yes Historical Provider, MD  lisinopril (PRINIVIL,ZESTRIL) 10  MG tablet Take 10 mg by mouth daily.  04/05/13  Yes Historical Provider, MD  metFORMIN (GLUCOPHAGE) 1000 MG tablet Take 1,000 mg by mouth 2 (two) times daily with a meal.  01/06/11  Yes Candelaria Celeste, MD  mirtazapine (REMERON) 15 MG tablet Take 15 mg by mouth daily.  02/03/13  Yes Historical Provider, MD  omeprazole (PRILOSEC) 20 MG capsule Take 20 mg by mouth daily as needed. For heartburn   Yes Historical Provider, MD  spironolactone (ALDACTONE) 25 MG tablet Take 25 mg by mouth daily. 01/27/14  Yes Historical Provider, MD  traMADol (ULTRAM) 50 MG tablet Take 1 tablet (50 mg total) by mouth every 6 (six) hours as needed. 09/13/13  Yes Gregor Hams, MD  ziprasidone (GEODON) 80 MG capsule Take 240 mg by mouth every evening. (per mental health)   Yes Historical Provider, MD  albuterol (PROVENTIL HFA;VENTOLIN HFA) 108 (90 BASE) MCG/ACT inhaler Inhale 2 puffs into the lungs every 4 (four) hours as needed for wheezing or shortness of breath. Patient not taking: Reported on 01/31/2014 09/27/13   Janne Napoleon, NP  azithromycin (ZITHROMAX Z-PAK) 250 MG tablet Take 2 tablets today, then 1 tablet daily until gone. Patient not taking: Reported on 01/31/2014 10/02/13   Melony Overly, MD  beclomethasone (QVAR) 80 MCG/ACT inhaler Inhale 1 puff into the lungs 2 (two) times daily. Patient not taking: Reported on 01/31/2014 09/27/13   Janne Napoleon, NP  econazole nitrate 1 % cream Apply topically daily. Patient not taking: Reported on 01/31/2014 12/15/12   Kendell Bane, DPM  Fluticasone-Salmeterol (ADVAIR DISKUS) 250-50 MCG/DOSE AEPB Inhale 1 puff into the lungs every 12 (twelve) hours. Patient not taking: Reported on 01/31/2014 05/09/13   Janne Napoleon, NP  guaifenesin (ROBITUSSIN) 100 MG/5ML syrup Take 5-10 mLs (100-200 mg total) by mouth every 4 (four) hours as needed for cough. Patient not taking: Reported on 01/31/2014 03/02/12   Jasper Riling. Pickering, MD  hydrochlorothiazide 25 MG tablet Take 25 mg by mouth every morning.      Historical Provider, MD  HYDROcodone-acetaminophen (NORCO) 5-325 MG per tablet Take 1 tablet by mouth every 6 (six) hours as needed for pain. Patient not taking: Reported on 01/31/2014 03/02/12   Jasper Riling. Pickering, MD  terbinafine (LAMISIL) 250 MG tablet Take 1 tablet (250 mg total) by mouth daily. Patient not taking: Reported on 01/31/2014 04/10/13   Bronson Ing, DPM   BP 152/78 mmHg  Pulse 51  Temp(Src) 97.5 F (36.4 C) (Oral)  Resp 20  SpO2 96%  LMP 05/09/2010 Physical Exam  Constitutional: She is oriented to person, place, and time. She appears well-developed and well-nourished. No distress.  HENT:  Head: Normocephalic and atraumatic.  Mouth/Throat: Oropharynx is clear and moist. No oropharyngeal exudate.  Eyes: Conjunctivae and EOM are normal. Pupils are equal, round, and reactive to light. No scleral icterus.  Neck:  Normal range of motion. Neck supple. No JVD present. No thyromegaly present.  Cardiovascular: Normal rate, regular rhythm, normal heart sounds and intact distal pulses.  Exam reveals no gallop and no friction rub.   No murmur heard. 2-3+ bilateral pretibial pitting edema. Negative calf tenderness bilaterally. Negative Homans sign bilaterally.  Pulmonary/Chest: Effort normal and breath sounds normal. No respiratory distress. She has no wheezes. She has no rales. She exhibits tenderness.  Abdominal: Soft. Bowel sounds are normal. She exhibits no distension and no mass. There is no tenderness. There is no rebound and no guarding.  Musculoskeletal:       Right ankle: She exhibits swelling. She exhibits normal range of motion, no ecchymosis, no deformity, no laceration and normal pulse. No tenderness. Achilles tendon normal.       Left ankle: She exhibits swelling. She exhibits normal range of motion, no ecchymosis, no deformity, no laceration and normal pulse. No tenderness. Achilles tendon normal.  Lymphadenopathy:    She has no cervical adenopathy.  Neurological:  She is alert and oriented to person, place, and time. She has normal strength. No cranial nerve deficit or sensory deficit. Coordination normal.  Skin: Skin is warm and dry. She is not diaphoretic.  Psychiatric: She has a normal mood and affect. Her behavior is normal. Judgment and thought content normal.  Nursing note and vitals reviewed.   ED Course  Procedures (including critical care time) Labs Review Labs Reviewed  PRO B NATRIURETIC PEPTIDE - Abnormal; Notable for the following:    Pro B Natriuretic peptide (BNP) 213.1 (*)    All other components within normal limits  CBC WITH DIFFERENTIAL  BASIC METABOLIC PANEL    Imaging Review Dg Chest 2 View  01/31/2014   CLINICAL DATA:  Leg swelling.  History of hypertension.  EXAM: CHEST  2 VIEW  COMPARISON:  08/27/2012.  FINDINGS: The cardiac silhouette remains borderline enlarged. The pulmonary vasculature and interstitial markings remain mildly prominent. Stable linear scarring in the left lower lung zone. Thoracic spine degenerative changes. Cholecystectomy clips.  IMPRESSION: No acute abnormality. Stable borderline cardiomegaly, pulmonary vascular congestion and chronic interstitial lung disease.   Electronically Signed   By: Enrique Sack M.D.   On: 01/31/2014 12:49     EKG Interpretation None      MDM   Final diagnoses:  Leg swelling  Bronchospasm   Patient is a 58 year old female who presents to the emergency room for worsening bilateral lower leg swelling. Physical exam reveals neurovascularly intact, swollen lower extremities. Patient also had incidental wheezing on exam. Patient usually uses inhalers, but states she is out of her albuterol. Have given a DuoNeb treatment here. Patient is already on spironolactone at home and states that she is taking it. Patient is greatly improved after DuoNeb treatment here. There is no longer wheezing on recheck. Chest x-ray reveals no acute abnormalities. Patient does have chronic interstitial  lung disease with some pulmonary vascular congestion. BNP is 213. CBC and BMP are unremarkable here. Leg swelling could be likely due to venous insufficiency versus CHF. Doubt CHF due to BNP. Will prescribe compression stockings and will defer to her PCP for further medication changes.  Patient given albuterol inhaler here to take home with her.      Cherylann Parr, PA-C 01/31/14 [place], MD 01/31/14 [phone]

## 2014-01-31 NOTE — ED Notes (Signed)
Reports swelling to bilateral feet x 2 weeks. Has been taking fluid pills they prescribed her but no relief. Denies any sob. No distress noted at triage and pt ambulatory.

## 2014-03-01 DIAGNOSIS — F259 Schizoaffective disorder, unspecified: Secondary | ICD-10-CM | POA: Diagnosis not present

## 2014-03-19 DIAGNOSIS — Z79899 Other long term (current) drug therapy: Secondary | ICD-10-CM | POA: Diagnosis not present

## 2014-04-06 ENCOUNTER — Other Ambulatory Visit: Payer: Self-pay

## 2014-04-06 DIAGNOSIS — Z1231 Encounter for screening mammogram for malignant neoplasm of breast: Secondary | ICD-10-CM

## 2014-04-28 ENCOUNTER — Ambulatory Visit: Payer: Medicare Other | Admitting: Podiatry

## 2014-04-30 DIAGNOSIS — F17210 Nicotine dependence, cigarettes, uncomplicated: Secondary | ICD-10-CM | POA: Diagnosis not present

## 2014-04-30 DIAGNOSIS — E119 Type 2 diabetes mellitus without complications: Secondary | ICD-10-CM | POA: Diagnosis not present

## 2014-04-30 DIAGNOSIS — Z6835 Body mass index (BMI) 35.0-35.9, adult: Secondary | ICD-10-CM | POA: Diagnosis not present

## 2014-04-30 DIAGNOSIS — E785 Hyperlipidemia, unspecified: Secondary | ICD-10-CM | POA: Diagnosis not present

## 2014-04-30 DIAGNOSIS — F17200 Nicotine dependence, unspecified, uncomplicated: Secondary | ICD-10-CM | POA: Diagnosis not present

## 2014-04-30 DIAGNOSIS — I10 Essential (primary) hypertension: Secondary | ICD-10-CM | POA: Diagnosis not present

## 2014-04-30 DIAGNOSIS — M1A00X0 Idiopathic chronic gout, unspecified site, without tophus (tophi): Secondary | ICD-10-CM | POA: Diagnosis not present

## 2014-04-30 DIAGNOSIS — Z1389 Encounter for screening for other disorder: Secondary | ICD-10-CM | POA: Diagnosis not present

## 2014-04-30 DIAGNOSIS — M150 Primary generalized (osteo)arthritis: Secondary | ICD-10-CM | POA: Diagnosis not present

## 2014-04-30 DIAGNOSIS — Z0000 Encounter for general adult medical examination without abnormal findings: Secondary | ICD-10-CM | POA: Diagnosis not present

## 2014-05-03 ENCOUNTER — Encounter: Payer: Self-pay | Admitting: Podiatry

## 2014-05-03 ENCOUNTER — Ambulatory Visit (INDEPENDENT_AMBULATORY_CARE_PROVIDER_SITE_OTHER): Payer: Medicare Other | Admitting: Podiatry

## 2014-05-03 DIAGNOSIS — M79676 Pain in unspecified toe(s): Secondary | ICD-10-CM | POA: Diagnosis not present

## 2014-05-03 DIAGNOSIS — B351 Tinea unguium: Secondary | ICD-10-CM | POA: Diagnosis not present

## 2014-05-03 NOTE — Patient Instructions (Signed)
Diabetes and Foot Care Diabetes may cause you to have problems because of poor blood supply (circulation) to your feet and legs. This may cause the skin on your feet to become thinner, break easier, and heal more slowly. Your skin may become dry, and the skin may peel and crack. You may also have nerve damage in your legs and feet causing decreased feeling in them. You may not notice minor injuries to your feet that could lead to infections or more serious problems. Taking care of your feet is one of the most important things you can do for yourself.  HOME CARE INSTRUCTIONS  Wear shoes at all times, even in the house. Do not go barefoot. Bare feet are easily injured.  Check your feet daily for blisters, cuts, and redness. If you cannot see the bottom of your feet, use a mirror or ask someone for help.  Wash your feet with warm water (do not use hot water) and mild soap. Then pat your feet and the areas between your toes until they are completely dry. Do not soak your feet as this can dry your skin.  Apply a moisturizing lotion or petroleum jelly (that does not contain alcohol and is unscented) to the skin on your feet and to dry, brittle toenails. Do not apply lotion between your toes.  Trim your toenails straight across. Do not dig under them or around the cuticle. File the edges of your nails with an emery board or nail file.  Do not cut corns or calluses or try to remove them with medicine.  Wear clean socks or stockings every day. Make sure they are not too tight. Do not wear knee-high stockings since they may decrease blood flow to your legs.  Wear shoes that fit properly and have enough cushioning. To break in new shoes, wear them for just a few hours a day. This prevents you from injuring your feet. Always look in your shoes before you put them on to be sure there are no objects inside.  Do not cross your legs. This may decrease the blood flow to your feet.  If you find a minor scrape,  cut, or break in the skin on your feet, keep it and the skin around it clean and dry. These areas may be cleansed with mild soap and water. Do not cleanse the area with peroxide, alcohol, or iodine.  When you remove an adhesive bandage, be sure not to damage the skin around it.  If you have a wound, look at it several times a day to make sure it is healing.  Do not use heating pads or hot water bottles. They may burn your skin. If you have lost feeling in your feet or legs, you may not know it is happening until it is too late.  Make sure your health care provider performs a complete foot exam at least annually or more often if you have foot problems. Report any cuts, sores, or bruises to your health care provider immediately. SEEK MEDICAL CARE IF:   You have an injury that is not healing.  You have cuts or breaks in the skin.  You have an ingrown nail.  You notice redness on your legs or feet.  You feel burning or tingling in your legs or feet.  You have pain or cramps in your legs and feet.  Your legs or feet are numb.  Your feet always feel cold. SEEK IMMEDIATE MEDICAL CARE IF:   There is increasing redness,   swelling, or pain in or around a wound.  There is a red line that goes up your leg.  Pus is coming from a wound.  You develop a fever or as directed by your health care provider.  You notice a bad smell coming from an ulcer or wound. Document Released: 02/17/2000 Document Revised: 10/22/2012 Document Reviewed: 07/29/2012 Exit[employer] Patient Information 2015 [employer]. This information is not intended to replace advice given to you by your health care provider. Make sure you discuss any questions you have with your health care provider.  

## 2014-05-03 NOTE — Progress Notes (Signed)
Patient ID: Jacqueline Reese, female   DOB: 1955-03-22, 59 y.o.   MRN: [id number]  Subjective: This patient presents again complaining of painful toenails and requests debridement.  Objective: Mild nonpitting edema noted bilaterally The toenails are elongated, incurvated, discolored, hypertrophic and tender to palpation 6-10  Assessment: Symptomatic onychomycoses 6-10  Plan: Debridement toenails 10 without a bleeding  Reappoint 3 months

## 2014-06-01 ENCOUNTER — Encounter (HOSPITAL_COMMUNITY): Payer: Self-pay | Admitting: *Deleted

## 2014-06-01 ENCOUNTER — Emergency Department (INDEPENDENT_AMBULATORY_CARE_PROVIDER_SITE_OTHER)
Admission: EM | Admit: 2014-06-01 | Discharge: 2014-06-01 | Disposition: A | Discharge status: Home / Self Care | Payer: Medicare Other | Source: Home / Self Care | Attending: Family Medicine | Admitting: Family Medicine

## 2014-06-01 DIAGNOSIS — M5440 Lumbago with sciatica, unspecified side: Secondary | ICD-10-CM | POA: Diagnosis not present

## 2014-06-01 MED ORDER — TRIAMCINOLONE ACETONIDE 40 MG/ML IJ SUSP
INTRAMUSCULAR | Status: AC
  Filled 2014-06-01: qty 1

## 2014-06-01 MED ORDER — TRIAMCINOLONE ACETONIDE 40 MG/ML IJ SUSP
40.0000 mg | Freq: Once | INTRAMUSCULAR | Status: AC
  Administered 2014-06-01: 40 mg via INTRAMUSCULAR

## 2014-06-01 MED ORDER — METHYLPREDNISOLONE ACETATE 40 MG/ML IJ SUSP
80.0000 mg | Freq: Once | INTRAMUSCULAR | Status: AC
  Administered 2014-06-01: 80 mg via INTRAMUSCULAR

## 2014-06-01 MED ORDER — METHYLPREDNISOLONE ACETATE 80 MG/ML IJ SUSP
INTRAMUSCULAR | Status: AC
  Filled 2014-06-01: qty 1

## 2014-06-01 NOTE — Discharge Instructions (Signed)
See your doctor if further problems. °

## 2014-06-01 NOTE — ED Notes (Signed)
Pt   Reports  Pain in  Back  And  Legs           X  2  Weeks  -  Pt  Reports  A  History  Of  Back  Problems  In  Past       Pt is  Sitting  Upright on  The  Exam table  Speaking in  Complete  sentances    And   Appearing   In no   Severe    Distress

## 2014-06-01 NOTE — ED Provider Notes (Signed)
CSN: [id number]     Arrival date & time 06/01/14  1032 History   First MD Initiated Contact with Patient 06/01/14 1155     Chief Complaint  Patient presents with  . Back Pain   (Consider location/radiation/quality/duration/timing/severity/associated sxs/prior Treatment) Patient is a 59 y.o. female presenting with back pain. The history is provided by the patient.  Back Pain Location:  Lumbar spine Quality:  Stiffness Radiates to:  L posterior upper leg and R posterior upper leg Pain severity:  Mild Onset quality:  Gradual Duration:  2 weeks Progression:  Unchanged Chronicity:  Chronic Worsened by:  Nothing tried Ineffective treatments:  None tried Associated symptoms: leg pain and paresthesias   Associated symptoms: no abdominal pain, no bladder incontinence, no bowel incontinence, no dysuria and no weakness   Risk factors: obesity     Past Medical History  Diagnosis Date  . Diabetes mellitus without complication   . Schizophrenic disorder   . Hypertension   . Chronic back pain   . Gout   . Depression    Past Surgical History  Procedure Laterality Date  . Cesarean section      x2   History reviewed. No pertinent family history. History  Substance Use Topics  . Smoking status: Current Every Day Smoker -- 0.33 packs/day    Types: Cigarettes  . Smokeless tobacco: Never Used  . Alcohol Use: No   OB History    No data available     Review of Systems  Constitutional: Negative.   Cardiovascular: Negative.   Gastrointestinal: Negative.  Negative for abdominal pain and bowel incontinence.  Genitourinary: Negative for bladder incontinence and dysuria.  Musculoskeletal: Positive for back pain. Negative for gait problem.  Neurological: Positive for paresthesias. Negative for weakness.    Allergies  Chantix; Doxycycline; Lamisil; Prednisone; and Sulfa antibiotics  Home Medications   Prior to Admission medications   Medication Sig Start Date End Date Taking?  Authorizing Provider  albuterol (ACCUNEB) 1.25 MG/3ML nebulizer solution Take 3 mLs (1.25 mg total) by nebulization every 6 (six) hours as needed for wheezing. 10/02/13   Melony Overly, MD  albuterol (PROVENTIL HFA;VENTOLIN HFA) 108 (90 BASE) MCG/ACT inhaler Inhale 2 puffs into the lungs every 4 (four) hours as needed for wheezing or shortness of breath. 05/09/13   Janne Napoleon, NP  albuterol (PROVENTIL HFA;VENTOLIN HFA) 108 (90 BASE) MCG/ACT inhaler Inhale 2 puffs into the lungs every 4 (four) hours as needed for wheezing or shortness of breath. 09/27/13   Janne Napoleon, NP  allopurinol (ZYLOPRIM) 100 MG tablet Take 200 mg by mouth daily.    Historical Provider, MD  aspirin (ASPIR-LOW) 81 MG EC tablet Take 81 mg by mouth daily.     Historical Provider, MD  azithromycin (ZITHROMAX Z-PAK) 250 MG tablet Take 2 tablets today, then 1 tablet daily until gone. 10/02/13   Melony Overly, MD  beclomethasone (QVAR) 80 MCG/ACT inhaler Inhale 1 puff into the lungs 2 (two) times daily. 09/27/13   Janne Napoleon, NP  buPROPion [facility] SR) 150 MG 12 hr tablet Take 150 mg by mouth daily. NO INSTRUCTIONS GIVEN (per mental health)    Historical Provider, MD  clonazePAM (KLONOPIN) 1 MG tablet Take 0.5-1 mg by mouth 3 (three) times daily. Takes 1 mg in the morning and 0.5 mg in the evening and at  bedtime    Historical Provider, MD  econazole nitrate 1 % cream Apply topically daily. 12/15/12   Gean Birchwood, DPM  escitalopram (LEXAPRO) 20  MG tablet Take 20 mg by mouth daily.     Historical Provider, MD  Fluticasone-Salmeterol (ADVAIR DISKUS) 250-50 MCG/DOSE AEPB Inhale 1 puff into the lungs every 12 (twelve) hours. 05/09/13   Janne Napoleon, NP  glipiZIDE (GLUCOTROL XL) 2.5 MG 24 hr tablet Take 2.5 mg by mouth daily.    Historical Provider, MD  guaifenesin (ROBITUSSIN) 100 MG/5ML syrup Take 5-10 mLs (100-200 mg total) by mouth every 4 (four) hours as needed for cough. 03/02/12   Davonna Belling, MD  haloperidol (HALDOL) 2 MG tablet  Take 2 mg by mouth 2 (two) times daily. per psych    Historical Provider, MD  hydrochlorothiazide 25 MG tablet Take 25 mg by mouth every morning.     Historical Provider, MD  HYDROcodone-acetaminophen (NORCO) 5-325 MG per tablet Take 1 tablet by mouth every 6 (six) hours as needed for pain. 03/02/12   Davonna Belling, MD  ibuprofen (ADVIL,MOTRIN) 800 MG tablet Take 800 mg by mouth every 8 (eight) hours as needed. For pain    Historical Provider, MD  lisinopril (PRINIVIL,ZESTRIL) 10 MG tablet Take 10 mg by mouth daily.  04/05/13   Historical Provider, MD  metFORMIN (GLUCOPHAGE) 1000 MG tablet Take 1,000 mg by mouth 2 (two) times daily with a meal.  01/06/11   Candelaria Celeste, MD  mirtazapine (REMERON) 15 MG tablet Take 15 mg by mouth daily.  02/03/13   Historical Provider, MD  omeprazole (PRILOSEC) 20 MG capsule Take 20 mg by mouth daily as needed. For heartburn    Historical Provider, MD  spironolactone (ALDACTONE) 25 MG tablet Take 25 mg by mouth daily. 01/27/14   Historical Provider, MD  terbinafine (LAMISIL) 250 MG tablet Take 1 tablet (250 mg total) by mouth daily. 04/10/13   Bronson Ing, DPM  traMADol (ULTRAM) 50 MG tablet Take 1 tablet (50 mg total) by mouth every 6 (six) hours as needed. 09/13/13   Gregor Hams, MD  ziprasidone (GEODON) 80 MG capsule Take 240 mg by mouth every evening. (per mental health)    Historical Provider, MD   BP 139/71 mmHg  Pulse 48  Temp(Src) 97.6 F (36.4 C) (Oral)  Resp 18  SpO2 100%  LMP 05/09/2010 Physical Exam  Constitutional: She is oriented to person, place, and time. She appears well-developed and well-nourished.  Abdominal: Soft. Bowel sounds are normal. There is no tenderness.  Musculoskeletal: She exhibits tenderness.       Lumbar back: She exhibits decreased range of motion, tenderness, bony tenderness, pain and spasm. She exhibits no swelling and normal pulse.  Neurological: She is alert and oriented to person, place, and time.  Skin: Skin is warm  and dry.  Nursing note and vitals reviewed.   ED Course  Procedures (including critical care time) Labs Review Labs Reviewed - No data to display  Imaging Review No results found.   MDM   1. Low back pain with sciatica, sciatica laterality unspecified, unspecified back pain laterality        Billy Fischer, MD 06/01/14 1208

## 2014-06-03 ENCOUNTER — Ambulatory Visit
Admission: RE | Admit: 2014-06-03 | Discharge: 2014-06-03 | Disposition: A | Discharge status: Home / Self Care | Payer: Medicare Other | Source: Ambulatory Visit

## 2014-06-03 DIAGNOSIS — Z1231 Encounter for screening mammogram for malignant neoplasm of breast: Secondary | ICD-10-CM

## 2014-06-03 DIAGNOSIS — E119 Type 2 diabetes mellitus without complications: Secondary | ICD-10-CM | POA: Diagnosis not present

## 2014-08-09 ENCOUNTER — Ambulatory Visit: Payer: Medicare Other | Admitting: Podiatry

## 2014-08-10 ENCOUNTER — Ambulatory Visit: Payer: Medicare Other | Admitting: Podiatry

## 2014-08-27 DIAGNOSIS — F250 Schizoaffective disorder, bipolar type: Secondary | ICD-10-CM | POA: Diagnosis not present

## 2014-11-04 ENCOUNTER — Other Ambulatory Visit: Payer: Self-pay | Admitting: Internal Medicine

## 2014-11-04 ENCOUNTER — Ambulatory Visit
Admission: RE | Admit: 2014-11-04 | Discharge: 2014-11-04 | Disposition: A | Discharge status: Home / Self Care | Payer: Medicare Other | Source: Ambulatory Visit | Attending: Internal Medicine | Admitting: Internal Medicine

## 2014-11-04 DIAGNOSIS — R1030 Lower abdominal pain, unspecified: Secondary | ICD-10-CM

## 2014-11-04 DIAGNOSIS — M160 Bilateral primary osteoarthritis of hip: Secondary | ICD-10-CM | POA: Diagnosis not present

## 2014-11-19 ENCOUNTER — Emergency Department (INDEPENDENT_AMBULATORY_CARE_PROVIDER_SITE_OTHER)
Admission: EM | Admit: 2014-11-19 | Discharge: 2014-11-19 | Disposition: A | Discharge status: Home / Self Care | Payer: Medicare Other | Source: Home / Self Care | Attending: Family Medicine | Admitting: Family Medicine

## 2014-11-19 ENCOUNTER — Encounter (HOSPITAL_COMMUNITY): Payer: Self-pay | Admitting: Emergency Medicine

## 2014-11-19 DIAGNOSIS — M779 Enthesopathy, unspecified: Secondary | ICD-10-CM

## 2014-11-19 DIAGNOSIS — M2550 Pain in unspecified joint: Secondary | ICD-10-CM

## 2014-11-19 DIAGNOSIS — M778 Other enthesopathies, not elsewhere classified: Secondary | ICD-10-CM

## 2014-11-19 DIAGNOSIS — M7712 Lateral epicondylitis, left elbow: Secondary | ICD-10-CM

## 2014-11-19 MED ORDER — BUPIVACAINE HCL (PF) 0.5 % IJ SOLN
INTRAMUSCULAR | Status: AC
  Filled 2014-11-19: qty 10

## 2014-11-19 MED ORDER — TRIAMCINOLONE ACETONIDE 40 MG/ML IJ SUSP
INTRAMUSCULAR | Status: AC
  Filled 2014-11-19: qty 1

## 2014-11-19 NOTE — ED Notes (Signed)
Reports pain in left shoulder and arm, pain in right hip and leg.  Patient asking for a cortisone shot.  No known injury.

## 2014-11-19 NOTE — Discharge Instructions (Signed)
Arthralgia °Your caregiver has diagnosed you as suffering from an arthralgia. Arthralgia means there is pain in a joint. This can come from many reasons including: °· Bruising the joint which causes soreness (inflammation) in the joint. °· Wear and tear on the joints which occur as we grow older (osteoarthritis). °· Overusing the joint. °· Various forms of arthritis. °· Infections of the joint. °Regardless of the cause of pain in your joint, most of these different pains respond to anti-inflammatory drugs and rest. The exception to this is when a joint is infected, and these cases are treated with antibiotics, if it is a bacterial infection. °HOME CARE INSTRUCTIONS  °· Rest the injured area for as long as directed by your caregiver. Then slowly start using the joint as directed by your caregiver and as the pain allows. Crutches as directed may be useful if the ankles, knees or hips are involved. If the knee was splinted or casted, continue use and care as directed. If an stretchy or elastic wrapping bandage has been applied today, it should be removed and re-applied every 3 to 4 hours. It should not be applied tightly, but firmly enough to keep swelling down. Watch toes and feet for swelling, bluish discoloration, coldness, numbness or excessive pain. If any of these problems (symptoms) occur, remove the ace bandage and re-apply more loosely. If these symptoms persist, contact your caregiver or return to this location. °· For the first 24 hours, keep the injured extremity elevated on pillows while lying down. °· Apply ice for 15-20 minutes to the sore joint every couple hours while awake for the first half day. Then 03-04 times per day for the first 48 hours. Put the ice in a plastic bag and place a towel between the bag of ice and your skin. °· Wear any splinting, casting, elastic bandage applications, or slings as instructed. °· Only take over-the-counter or prescription medicines for pain, discomfort, or fever as  directed by your caregiver. Do not use aspirin immediately after the injury unless instructed by your physician. Aspirin can cause increased bleeding and bruising of the tissues. °· If you were given crutches, continue to use them as instructed and do not resume weight bearing on the sore joint until instructed. °Persistent pain and inability to use the sore joint as directed for more than 2 to 3 days are warning signs indicating that you should see a caregiver for a follow-up visit as soon as possible. Initially, a hairline fracture (break in bone) may not be evident on X-rays. Persistent pain and swelling indicate that further evaluation, non-weight bearing or use of the joint (use of crutches or slings as instructed), or further X-rays are indicated. X-rays may sometimes not show a small fracture until a week or 10 days later. Make a follow-up appointment with your own caregiver or one to whom we have referred you. A radiologist (specialist in reading X-rays) may read your X-rays. Make sure you know how you are to obtain your X-ray results. Do not assume everything is normal if you do not hear from us. °SEEK MEDICAL CARE IF: °Bruising, swelling, or pain increases. °SEEK IMMEDIATE MEDICAL CARE IF:  °· Your fingers or toes are numb or blue. °· The pain is not responding to medications and continues to stay the same or get worse. °· The pain in your joint becomes severe. °· You develop a fever over 102° F (38.9° C). °· It becomes impossible to move or use the joint. °MAKE SURE YOU:  °·   Understand these instructions.  Will watch your condition.  Will get help right away if you are not doing well or get worse. Document Released: 02/19/2005 Document Revised: 05/14/2011 Document Reviewed: 10/08/2007 [facility] Patient Information 2015 [place], [place]. This information is not intended to replace advice given to you by your health care provider. Make sure you discuss any questions you have with your health care  provider.  Joint Injection Care After Refer to this sheet in the next few days. These instructions provide you with information on caring for yourself after you have had a joint injection. Your caregiver also may give you more specific instructions. Your treatment has been planned according to current medical practices, but problems sometimes occur. Call your caregiver if you have any problems or questions after your procedure. After any type of joint injection, it is not uncommon to experience:  Soreness, swelling, or bruising around the injection site.  Mild numbness, tingling, or weakness around the injection site caused by the numbing medicine used before or with the injection. It also is possible to experience the following effects associated with the specific agent after injection:  Iodine-based contrast agents:  Allergic reaction (itching, hives, widespread redness, and swelling beyond the injection site).  Corticosteroids (These effects are rare.):  Allergic reaction.  Increased blood sugar levels (If you have diabetes and you notice that your blood sugar levels have increased, notify your caregiver).  Increased blood pressure levels.  Mood swings.  Hyaluronic acid in the use of viscosupplementation.  Temporary heat or redness.  Temporary rash and itching.  Increased fluid accumulation in the injected joint. These effects all should resolve within a day after your procedure.  HOME CARE INSTRUCTIONS  Limit yourself to light activity the day of your procedure. Avoid lifting heavy objects, bending, stooping, or twisting.  Take prescription or over-the-counter pain medication as directed by your caregiver.  You may apply ice to your injection site to reduce pain and swelling the day of your procedure. Ice may be applied 03-04 times:  Put ice in a plastic bag.  Place a towel between your skin and the bag.  Leave the ice on for no longer than 15-20 minutes each  time. SEEK IMMEDIATE MEDICAL CARE IF:   Pain and swelling get worse rather than better or extend beyond the injection site.  Numbness does not go away.  Blood or fluid continues to leak from the injection site.  You have chest pain.  You have swelling of your face or tongue.  You have trouble breathing or you become dizzy.  You develop a fever, chills, or severe tenderness at the injection site that last longer than 1 day. MAKE SURE YOU:  Understand these instructions.  Watch your condition.  Get help right away if you are not doing well or if you get worse. Document Released: 11/02/2010 Document Revised: 05/14/2011 Document Reviewed: 11/02/2010 [facility] Patient Information 2015 [place], [place]. This information is not intended to replace advice given to you by your health care provider. Make sure you discuss any questions you have with your health care provider.  Tendinitis Tendinitis is swelling and inflammation of the tendons. Tendons are band-like tissues that connect muscle to bone. Tendinitis commonly occurs in the:   Shoulders (rotator cuff).  Heels (Achilles tendon).  Elbows (triceps tendon). CAUSES Tendinitis is usually caused by overusing the tendon, muscles, and joints involved. When the tissue surrounding a tendon (synovium) becomes inflamed, it is called tenosynovitis. Tendinitis commonly develops in people whose jobs require repetitive  motions. SYMPTOMS  Pain.  Tenderness.  Mild swelling. DIAGNOSIS Tendinitis is usually diagnosed by physical exam. Your health care provider may also order X-rays or other imaging tests. TREATMENT Your health care provider may recommend certain medicines or exercises for your treatment. HOME CARE INSTRUCTIONS   Use a sling or splint for as long as directed by your health care provider until the pain decreases.  Put ice on the injured area.  Put ice in a plastic bag.  Place a towel between your skin and the  bag.  Leave the ice on for 15-20 minutes, 3-4 times a day, or as directed by your health care provider.  Avoid using the limb while the tendon is painful. Perform gentle range of motion exercises only as directed by your health care provider. Stop exercises if pain or discomfort increase, unless directed otherwise by your health care provider.  Only take over-the-counter or prescription medicines for pain, discomfort, or fever as directed by your health care provider. SEEK MEDICAL CARE IF:   Your pain and swelling increase.  You develop new, unexplained symptoms, especially increased numbness in the hands. MAKE SURE YOU:   Understand these instructions.  Will watch your condition.  Will get help right away if you are not doing well or get worse. Document Released: 02/17/2000 Document Revised: 07/06/2013 Document Reviewed: 05/08/2010 [facility] Patient Information 2015 [place], [place]. This information is not intended to replace advice given to you by your health care provider. Make sure you discuss any questions you have with your health care provider.  Tennis Elbow Tennis elbow can happen in any sport or job where you overuse your elbow. It is caused by doing the same motion over and over. This makes small tears in the forearm muscles. It can also cause muscle redness, soreness, and puffiness (inflammation). HOME CARE  Rest.  Use your other hand or arm that is not affected. Change your grip.  Only take medicine as told by your doctor.  Put ice on the area after activity.  Put ice in a plastic bag.  Place a towel between your skin and the bag.  Leave the ice on for 15-20 minutes, 03-04 times a day.  Wear a splint or sling as told by your doctor. This lets the area rest and heal faster. GET HELP RIGHT AWAY IF: Your pain gets worse or does not improve in 2 weeks even with treatment. MAKE SURE YOU:   Understand these instructions.  Will watch your condition.  Will get help  right away if you are not doing well or get worse. Document Released: 08/09/2009 Document Revised: 05/14/2011 Document Reviewed: 08/09/2009 [facility] Patient Information 2015 [place], [place]. This information is not intended to replace advice given to you by your health care provider. Make sure you discuss any questions you have with your health care provider.

## 2014-11-19 NOTE — ED Provider Notes (Signed)
CSN: [id number]     Arrival date & time 11/19/14  1324 History   First MD Initiated Contact with Patient 11/19/14 1454     Chief Complaint  Patient presents with  . Pain   (Consider location/radiation/quality/duration/timing/severity/associated sxs/prior Treatment) HPI Comments: 59 year old  obese female who is a poor historian and with very low knowledge base presents with pain in her right leg and her left arm. She is somewhat confused as to how long she has had pain in the right leg in how many times that the best I can understand is that she was told at one time that she had arthritis in her right hip and the length of her leg and that she occasionally gets cortisone shots. She is complaining of discomfort with ambulation in the muscles of the right thigh, hip and knee. This is worse with ambulation. It flared up approximately one to 2 weeks ago.   Second complaint is that of pain in the left deltoid muscle, extensor surface of the left forearm, elbow and wrist. She develops numbness in her fingers periodically. Pain is worse with lifting objects such as her purse. No history of trauma or fall attributing to either areas of pain.   The history is provided by the patient.    Past Medical History  Diagnosis Date  . Diabetes mellitus without complication   . Schizophrenic disorder   . Hypertension   . Chronic back pain   . Gout   . Depression    Past Surgical History  Procedure Laterality Date  . Cesarean section      x2   No family history on file. Social History  Substance Use Topics  . Smoking status: Current Every Day Smoker -- 0.33 packs/day    Types: Cigarettes  . Smokeless tobacco: Never Used  . Alcohol Use: No   OB History    No data available     Review of Systems  Constitutional: Positive for activity change. Negative for fever, chills and fatigue.  HENT: Negative.   Respiratory: Negative for cough and shortness of breath.   Cardiovascular: Negative.    Gastrointestinal: Negative.   Genitourinary: Negative for dysuria and frequency.  Musculoskeletal: Positive for myalgias, arthralgias and gait problem. Negative for back pain, joint swelling and neck pain.       As per HPI  Skin: Negative for color change, pallor and rash.  Neurological: Positive for numbness.  Psychiatric/Behavioral: Negative.     Allergies  Chantix; Doxycycline; Lamisil; Prednisone; and Sulfa antibiotics  Home Medications   Prior to Admission medications   Medication Sig Start Date End Date Taking? Authorizing Provider  albuterol (ACCUNEB) 1.25 MG/3ML nebulizer solution Take 3 mLs (1.25 mg total) by nebulization every 6 (six) hours as needed for wheezing. 10/02/13   Melony Overly, MD  albuterol (PROVENTIL HFA;VENTOLIN HFA) 108 (90 BASE) MCG/ACT inhaler Inhale 2 puffs into the lungs every 4 (four) hours as needed for wheezing or shortness of breath. 05/09/13   Janne Napoleon, NP  albuterol (PROVENTIL HFA;VENTOLIN HFA) 108 (90 BASE) MCG/ACT inhaler Inhale 2 puffs into the lungs every 4 (four) hours as needed for wheezing or shortness of breath. 09/27/13   Janne Napoleon, NP  allopurinol (ZYLOPRIM) 100 MG tablet Take 200 mg by mouth daily.    Historical Provider, MD  aspirin (ASPIR-LOW) 81 MG EC tablet Take 81 mg by mouth daily.     Historical Provider, MD  azithromycin (ZITHROMAX Z-PAK) 250 MG tablet Take 2 tablets today, then 1  tablet daily until gone. 10/02/13   Melony Overly, MD  beclomethasone (QVAR) 80 MCG/ACT inhaler Inhale 1 puff into the lungs 2 (two) times daily. 09/27/13   Janne Napoleon, NP  buPROPion [facility] SR) 150 MG 12 hr tablet Take 150 mg by mouth daily. NO INSTRUCTIONS GIVEN (per mental health)    Historical Provider, MD  clonazePAM (KLONOPIN) 1 MG tablet Take 0.5-1 mg by mouth 3 (three) times daily. Takes 1 mg in the morning and 0.5 mg in the evening and at  bedtime    Historical Provider, MD  econazole nitrate 1 % cream Apply topically daily. 12/15/12   Richard Joelene Millin, DPM  escitalopram (LEXAPRO) 20 MG tablet Take 20 mg by mouth daily.     Historical Provider, MD  Fluticasone-Salmeterol (ADVAIR DISKUS) 250-50 MCG/DOSE AEPB Inhale 1 puff into the lungs every 12 (twelve) hours. 05/09/13   Janne Napoleon, NP  glipiZIDE (GLUCOTROL XL) 2.5 MG 24 hr tablet Take 2.5 mg by mouth daily.    Historical Provider, MD  guaifenesin (ROBITUSSIN) 100 MG/5ML syrup Take 5-10 mLs (100-200 mg total) by mouth every 4 (four) hours as needed for cough. 03/02/12   Davonna Belling, MD  haloperidol (HALDOL) 2 MG tablet Take 2 mg by mouth 2 (two) times daily. per psych    Historical Provider, MD  hydrochlorothiazide 25 MG tablet Take 25 mg by mouth every morning.     Historical Provider, MD  HYDROcodone-acetaminophen (NORCO) 5-325 MG per tablet Take 1 tablet by mouth every 6 (six) hours as needed for pain. 03/02/12   Davonna Belling, MD  ibuprofen (ADVIL,MOTRIN) 800 MG tablet Take 800 mg by mouth every 8 (eight) hours as needed. For pain    Historical Provider, MD  lisinopril (PRINIVIL,ZESTRIL) 10 MG tablet Take 10 mg by mouth daily.  04/05/13   Historical Provider, MD  metFORMIN (GLUCOPHAGE) 1000 MG tablet Take 1,000 mg by mouth 2 (two) times daily with a meal.  01/06/11   Candelaria Celeste, MD  mirtazapine (REMERON) 15 MG tablet Take 15 mg by mouth daily.  02/03/13   Historical Provider, MD  omeprazole (PRILOSEC) 20 MG capsule Take 20 mg by mouth daily as needed. For heartburn    Historical Provider, MD  spironolactone (ALDACTONE) 25 MG tablet Take 25 mg by mouth daily. 01/27/14   Historical Provider, MD  terbinafine (LAMISIL) 250 MG tablet Take 1 tablet (250 mg total) by mouth daily. 04/10/13   Bronson Ing, DPM  traMADol (ULTRAM) 50 MG tablet Take 1 tablet (50 mg total) by mouth every 6 (six) hours as needed. 09/13/13   Gregor Hams, MD  ziprasidone (GEODON) 80 MG capsule Take 240 mg by mouth every evening. (per mental health)    Historical Provider, MD   Meds Ordered and Administered this  Visit  Medications - No data to display  BP 127/71 mmHg  Pulse 60  Temp(Src) 98.3 F (36.8 C) (Oral)  Resp 18  SpO2 95%  LMP 05/09/2010 No data found.   Physical Exam  Constitutional: She appears well-developed and well-nourished. No distress.  Neck: Normal range of motion. Neck supple.  Cardiovascular: Normal rate.   Pulmonary/Chest: Effort normal. No respiratory distress.  Musculoskeletal: She exhibits no edema.  Patient is ambulatory with a slight antalgic gait. She is able to put full weight on the left lower extremity and the right lower extremity separately. She is able to get onto the exam table with minimal assistance. There is mild tenderness with deep palpation to  the right hip and the anterior thigh musculature. Minor tenderness to the right knee. No swelling, deformity, discoloration or increased warmth. Left arm exam reveals tenderness over the left lateral epicondyles, forearm extensor muscles, left wrist and deltoid muscle. She is able to raise her arm over her head. Lifting her purse causes pain in the areas above. Grasp is normal. Strength 4 out of 5. Distal neurovascular motor intact.  Neurological: She is alert. She exhibits normal muscle tone.  Skin: Skin is warm and dry.  Psychiatric: She has a normal mood and affect.  Nursing note and vitals reviewed.   ED Course  Injection of joint Date/Time: 11/19/2014 4:03 PM Performed by: Marcha Dutton, DAVID Authorized by: Ihor Gully D Consent: Verbal consent obtained. Risks and benefits: risks, benefits and alternatives were discussed Consent given by: patient Patient understanding: patient states understanding of the procedure being performed Patient identity confirmed: verbally with patient Local anesthesia used: no Patient sedated: no Comments: Injection of bupivacaine 3 cc and Kenalog 30 mg to the right lateral hip bursa. A second injection IM is given in the left deltoid containing Kenalog 30 mg and 2 cc of  bupivacaine.   (including critical care time)  Labs Review Labs Reviewed - No data to display  Imaging Review No results found.   Visual Acuity Review  Right Eye Distance:   Left Eye Distance:   Bilateral Distance:    Right Eye Near:   Left Eye Near:    Bilateral Near:         MDM   1. Arthralgia   2. Left lateral epicondylitis   3. Tendinitis of forearm   4. Tendinitis of left wrist    Injections as above. Left wrist immobilizer apply ice to sore areas of wrist, forearm and left elbow Must follow-up with the PCP. We are unable to manage her diabetes, hypertension, asthma and chronic pain.    Janne Napoleon, NP 11/19/14 1610  Janne Napoleon, NP 11/19/14 [phone]

## 2014-11-29 DIAGNOSIS — M79651 Pain in right thigh: Secondary | ICD-10-CM | POA: Diagnosis not present

## 2014-11-29 DIAGNOSIS — M25551 Pain in right hip: Secondary | ICD-10-CM | POA: Diagnosis not present

## 2014-12-27 ENCOUNTER — Other Ambulatory Visit: Payer: Self-pay

## 2014-12-27 DIAGNOSIS — Z1231 Encounter for screening mammogram for malignant neoplasm of breast: Secondary | ICD-10-CM

## 2015-02-02 ENCOUNTER — Other Ambulatory Visit: Payer: Self-pay | Admitting: Internal Medicine

## 2015-02-02 DIAGNOSIS — R0989 Other specified symptoms and signs involving the circulatory and respiratory systems: Secondary | ICD-10-CM

## 2015-02-03 ENCOUNTER — Other Ambulatory Visit: Payer: Self-pay | Admitting: Internal Medicine

## 2015-02-03 ENCOUNTER — Ambulatory Visit
Admission: RE | Admit: 2015-02-03 | Discharge: 2015-02-03 | Disposition: A | Discharge status: Home / Self Care | Payer: Commercial Managed Care - HMO | Source: Ambulatory Visit | Attending: Internal Medicine | Admitting: Internal Medicine

## 2015-02-03 DIAGNOSIS — R0989 Other specified symptoms and signs involving the circulatory and respiratory systems: Secondary | ICD-10-CM

## 2015-02-16 ENCOUNTER — Emergency Department (HOSPITAL_COMMUNITY)
Admission: EM | Admit: 2015-02-16 | Discharge: 2015-02-16 | Disposition: A | Discharge status: Home / Self Care | Payer: Commercial Managed Care - HMO | Attending: Emergency Medicine | Admitting: Emergency Medicine

## 2015-02-16 ENCOUNTER — Encounter (HOSPITAL_COMMUNITY): Payer: Self-pay | Admitting: Emergency Medicine

## 2015-02-16 DIAGNOSIS — F17210 Nicotine dependence, cigarettes, uncomplicated: Secondary | ICD-10-CM | POA: Diagnosis not present

## 2015-02-16 DIAGNOSIS — F329 Major depressive disorder, single episode, unspecified: Secondary | ICD-10-CM | POA: Diagnosis not present

## 2015-02-16 DIAGNOSIS — F209 Schizophrenia, unspecified: Secondary | ICD-10-CM | POA: Diagnosis not present

## 2015-02-16 DIAGNOSIS — Z7984 Long term (current) use of oral hypoglycemic drugs: Secondary | ICD-10-CM | POA: Diagnosis not present

## 2015-02-16 DIAGNOSIS — R200 Anesthesia of skin: Secondary | ICD-10-CM | POA: Diagnosis not present

## 2015-02-16 DIAGNOSIS — Z792 Long term (current) use of antibiotics: Secondary | ICD-10-CM | POA: Diagnosis not present

## 2015-02-16 DIAGNOSIS — M109 Gout, unspecified: Secondary | ICD-10-CM | POA: Insufficient documentation

## 2015-02-16 DIAGNOSIS — I10 Essential (primary) hypertension: Secondary | ICD-10-CM | POA: Diagnosis not present

## 2015-02-16 DIAGNOSIS — Z7982 Long term (current) use of aspirin: Secondary | ICD-10-CM | POA: Diagnosis not present

## 2015-02-16 DIAGNOSIS — M25512 Pain in left shoulder: Secondary | ICD-10-CM | POA: Diagnosis not present

## 2015-02-16 DIAGNOSIS — G8929 Other chronic pain: Secondary | ICD-10-CM | POA: Insufficient documentation

## 2015-02-16 DIAGNOSIS — Z7951 Long term (current) use of inhaled steroids: Secondary | ICD-10-CM | POA: Insufficient documentation

## 2015-02-16 DIAGNOSIS — M25551 Pain in right hip: Secondary | ICD-10-CM

## 2015-02-16 DIAGNOSIS — E119 Type 2 diabetes mellitus without complications: Secondary | ICD-10-CM | POA: Insufficient documentation

## 2015-02-16 DIAGNOSIS — Z79899 Other long term (current) drug therapy: Secondary | ICD-10-CM | POA: Insufficient documentation

## 2015-02-16 MED ORDER — HYDROCODONE-ACETAMINOPHEN 5-325 MG PO TABS: 1.0000 | ORAL_TABLET | Freq: Four times a day (QID) | ORAL | Status: DC | PRN

## 2015-02-16 NOTE — ED Notes (Signed)
Pt sts left arm pain from carpel tunnel and pain in right hip from chronic OA; pt denies obvious injury

## 2015-02-16 NOTE — ED Notes (Signed)
Declined W/C at D/C and was escorted to lobby by RN. 

## 2015-02-16 NOTE — Care Management Note (Signed)
Case Management Note  Patient Details  Name: Jacqueline Reese MRN: [id number] Date of Birth: 28-Aug-1955  Subjective/Objective:                  59 y.o. female who presents to the Emergency Department complaining of constant left shoulder pain that radiates into her left wrist for the past 2 weeks. Pain is worse at night and worsened with movement. She reports associated numbness/tingling in her second and third left fingers. //Home alone  Action/Plan: Follow for disposition needs.   Expected Discharge Date:       02/16/15           Expected Discharge Plan:  Home/Self Care  In-House Referral:  PCP / [facility]  Discharge planning Services  CM Consult, [facility], Follow-up appt scheduled  Post Acute Care Choice:  NA Choice offered to:  Patient  DME Arranged:  N/A DME Agency:  NA  HH Arranged:  NA HH Agency:  NA  Status of Service:  Completed, signed off  Medicare Important Message Given:    Date Medicare IM Given:    Medicare IM give by:    Date Additional Medicare IM Given:    Additional Medicare Important Message give by:     If discussed at [place] of Stay Meetings, dates discussed:    Additional Comments: Seana Underwood J. Clydene Laming, RN, BSN, [place] [phone] ED CM consulted regarding PCP establishment and insurance enrollment. Pt presented to [facility] ED today with pain. NCM met with pt at bedside; pt confirms not having access to f/u care with PCP or insurance coverage. Discussed with patient importance and benefits of establishing PCP, and not utilizing the ED for primary care needs. Pt verbalized understanding and is in agreement. Discussed other options, provided list of local  affordable PCPs.  Pt voiced interest in the [facility] and [facility].  NCM advised that [facility] providers are seeing pts at [facility]. Pt verbalized understanding. NCM set up appointment with Cammie Sickle, NP  03/10/15 at 1045.  NCM informed pt they  may visit [facility] for Rx needs upon discharge.    Fuller Mandril, RN 02/16/2015, 12:00 PM

## 2015-02-16 NOTE — ED Provider Notes (Signed)
CSN: [id number]     Arrival date & time 02/16/15  1045 History   By signing my name below, I, Jacqueline Reese, attest that this documentation has been prepared under the direction and in the presence of [name], PA-C Electronically Signed: Ladene Artist, ED Scribe 02/16/2015 at 12:04 PM.   Chief Complaint  Patient presents with  . Arm Pain  . Hip Pain   The history is provided by the patient. No language interpreter was used.    HPI Comments: Jacqueline Reese is a 59 y.o. female who presents to the Emergency Department complaining of constant left shoulder pain that radiates into her left wrist for the past 2 weeks. Pain is worse at night and worsened with movement. She reports associated numbness/tingling in her second and third left fingers. Pt states that she was seen by a physician for the same and was diagnosed with carpal tunnel. She denies neck pain.  Pt also presents with right posterior hip pain that radiates into her right calf for a few months. She reports that pain is exacerbated with movement. Pt reports h/o osteoarthritis and states that she typically gets cortisone injections in her hip. Pt has been seen by orthopedist Dr. Marlou Sa who suggested a hip replacement.   Past Medical History  Diagnosis Date  . Diabetes mellitus without complication ([place])   . Schizophrenic disorder ([place])   . Hypertension   . Chronic back pain   . Gout   . Depression    Past Surgical History  Procedure Laterality Date  . Cesarean section      x2   History reviewed. No pertinent family history. Social History  Substance Use Topics  . Smoking status: Current Every Day Smoker -- 0.33 packs/day    Types: Cigarettes  . Smokeless tobacco: Never Used  . Alcohol Use: No   OB History    No data available     Review of Systems  Musculoskeletal: Positive for arthralgias. Negative for neck pain.  Neurological: Positive for numbness.   Allergies  Chantix; Doxycycline; Lamisil;  Prednisone; and Sulfa antibiotics  Home Medications   Prior to Admission medications   Medication Sig Start Date End Date Taking? Authorizing Provider  albuterol (ACCUNEB) 1.25 MG/3ML nebulizer solution Take 3 mLs (1.25 mg total) by nebulization every 6 (six) hours as needed for wheezing. 10/02/13   Melony Overly, MD  albuterol (PROVENTIL HFA;VENTOLIN HFA) 108 (90 BASE) MCG/ACT inhaler Inhale 2 puffs into the lungs every 4 (four) hours as needed for wheezing or shortness of breath. 05/09/13   Janne Napoleon, NP  albuterol (PROVENTIL HFA;VENTOLIN HFA) 108 (90 BASE) MCG/ACT inhaler Inhale 2 puffs into the lungs every 4 (four) hours as needed for wheezing or shortness of breath. 09/27/13   Janne Napoleon, NP  allopurinol (ZYLOPRIM) 100 MG tablet Take 200 mg by mouth daily.    Historical Provider, MD  aspirin (ASPIR-LOW) 81 MG EC tablet Take 81 mg by mouth daily.     Historical Provider, MD  azithromycin (ZITHROMAX Z-PAK) 250 MG tablet Take 2 tablets today, then 1 tablet daily until gone. 10/02/13   Melony Overly, MD  beclomethasone (QVAR) 80 MCG/ACT inhaler Inhale 1 puff into the lungs 2 (two) times daily. 09/27/13   Janne Napoleon, NP  buPROPion [facility] SR) 150 MG 12 hr tablet Take 150 mg by mouth daily. NO INSTRUCTIONS GIVEN (per mental health)    Historical Provider, MD  clonazePAM (KLONOPIN) 1 MG tablet Take 0.5-1 mg by mouth 3 (three)  times daily. Takes 1 mg in the morning and 0.5 mg in the evening and at  bedtime    Historical Provider, MD  econazole nitrate 1 % cream Apply topically daily. 12/15/12   Richard Joelene Millin, DPM  escitalopram (LEXAPRO) 20 MG tablet Take 20 mg by mouth daily.     Historical Provider, MD  Fluticasone-Salmeterol (ADVAIR DISKUS) 250-50 MCG/DOSE AEPB Inhale 1 puff into the lungs every 12 (twelve) hours. 05/09/13   Janne Napoleon, NP  glipiZIDE (GLUCOTROL XL) 2.5 MG 24 hr tablet Take 2.5 mg by mouth daily.    Historical Provider, MD  guaifenesin (ROBITUSSIN) 100 MG/5ML syrup Take 5-10 mLs  (100-200 mg total) by mouth every 4 (four) hours as needed for cough. 03/02/12   Davonna Belling, MD  haloperidol (HALDOL) 2 MG tablet Take 2 mg by mouth 2 (two) times daily. per psych    Historical Provider, MD  hydrochlorothiazide 25 MG tablet Take 25 mg by mouth every morning.     Historical Provider, MD  HYDROcodone-acetaminophen (NORCO) 5-325 MG tablet Take 1 tablet by mouth every 6 (six) hours as needed for severe pain. 02/16/15   [name], PA-C  ibuprofen (ADVIL,MOTRIN) 800 MG tablet Take 800 mg by mouth every 8 (eight) hours as needed. For pain    Historical Provider, MD  lisinopril (PRINIVIL,ZESTRIL) 10 MG tablet Take 10 mg by mouth daily.  04/05/13   Historical Provider, MD  metFORMIN (GLUCOPHAGE) 1000 MG tablet Take 1,000 mg by mouth 2 (two) times daily with a meal.  01/06/11   Candelaria Celeste, MD  mirtazapine (REMERON) 15 MG tablet Take 15 mg by mouth daily.  02/03/13   Historical Provider, MD  omeprazole (PRILOSEC) 20 MG capsule Take 20 mg by mouth daily as needed. For heartburn    Historical Provider, MD  spironolactone (ALDACTONE) 25 MG tablet Take 25 mg by mouth daily. 01/27/14   Historical Provider, MD  terbinafine (LAMISIL) 250 MG tablet Take 1 tablet (250 mg total) by mouth daily. 04/10/13   Bronson Ing, DPM  ziprasidone (GEODON) 80 MG capsule Take 240 mg by mouth every evening. (per mental health)    Historical Provider, MD   BP 142/103 mmHg  Pulse 69  Temp(Src) 97.5 F (36.4 C) (Oral)  Resp 17  Ht 5\' 4"  (1.626 m)  Wt 203 lb (92.08 kg)  BMI 34.83 kg/m2  SpO2 98%  LMP 05/09/2010 Physical Exam  Constitutional: She is oriented to person, place, and time. She appears well-developed and well-nourished. No distress.  HENT:  Head: Normocephalic and atraumatic.  Eyes: Conjunctivae and EOM are normal.  Neck: Neck supple. No tracheal deviation present.  Cardiovascular: Normal rate.   Pulmonary/Chest: Effort normal. No respiratory distress.  Musculoskeletal: Normal range of  motion.  Tenderness to palpation of the posterior shoulder. L shoulder pain is reproducible. Good ROM. Good grip strength bilaterally. Normal radial pulse.  R posterior hip pain. Negative straight leg raise. Good strength in hip with dorsiflexion, plantar flexion, inversion and eversion.   Neurological: She is alert and oriented to person, place, and time.  Skin: Skin is warm and dry.  Psychiatric: She has a normal mood and affect. Her behavior is normal.  Nursing note and vitals reviewed.  ED Course  Procedures (including critical care time) DIAGNOSTIC STUDIES: Oxygen Saturation is 98% on RA, normal by my interpretation.    COORDINATION OF CARE: 11:58 AM-Discussed treatment plan which includes Norco and referral to ortho with pt at bedside and pt agreed to plan.  Labs Review Labs Reviewed - No data to display  Imaging Review No results found. I have personally reviewed and evaluated these images and lab results as part of my medical decision-making.   EKG Interpretation None      MDM  Chronic pain. Appears muscular in the shoulder vs radiculopathy in the neck. No weakness or neuro deficients. R hip pain is chronic. F/u with PCP and ortho.   Final diagnoses:  Left shoulder pain  Hip pain, right    I personally performed the services described in this documentation, which was scribed in my presence. The recorded information has been reviewed and is accurate.         [name], PA-C 02/16/15 [facility], MD 02/17/15 [phone]

## 2015-02-16 NOTE — Discharge Instructions (Signed)
Shoulder Pain The shoulder is the joint that connects your arms to your body. The bones that form the shoulder joint include the upper arm bone (humerus), the shoulder blade (scapula), and the collarbone (clavicle). The top of the humerus is shaped like a ball and fits into a rather flat socket on the scapula (glenoid cavity). A combination of muscles and strong, fibrous tissues that connect muscles to bones (tendons) support your shoulder joint and hold the ball in the socket. Small, fluid-filled sacs (bursae) are located in different areas of the joint. They act as cushions between the bones and the overlying soft tissues and help reduce friction between the gliding tendons and the bone as you move your arm. Your shoulder joint allows a wide range of motion in your arm. This range of motion allows you to do things like scratch your back or throw a ball. However, this range of motion also makes your shoulder more prone to pain from overuse and injury. Causes of shoulder pain can originate from both injury and overuse and usually can be grouped in the following four categories:  Redness, swelling, and pain (inflammation) of the tendon (tendinitis) or the bursae (bursitis).  Instability, such as a dislocation of the joint.  Inflammation of the joint (arthritis).  Broken bone (fracture). HOME CARE INSTRUCTIONS   Apply ice to the sore area.  Put ice in a plastic bag.  Place a towel between your skin and the bag.  Leave the ice on for 15-20 minutes, 3-4 times per day for the first 2 days, or as directed by your health care provider.  Stop using cold packs if they do not help with the pain.  If you have a shoulder sling or immobilizer, wear it as long as your caregiver instructs. Only remove it to shower or bathe. Move your arm as little as possible, but keep your hand moving to prevent swelling.  Squeeze a soft ball or foam pad as much as possible to help prevent swelling.  Only take  over-the-counter or prescription medicines for pain, discomfort, or fever as directed by your caregiver. SEEK MEDICAL CARE IF:   Your shoulder pain increases, or new pain develops in your arm, hand, or fingers.  Your hand or fingers become cold and numb.  Your pain is not relieved with medicines. SEEK IMMEDIATE MEDICAL CARE IF:   Your arm, hand, or fingers are numb or tingling.  Your arm, hand, or fingers are significantly swollen or turn white or blue. MAKE SURE YOU:   Understand these instructions.  Will watch your condition.  Will get help right away if you are not doing well or get worse.   This information is not intended to replace advice given to you by your health care provider. Make sure you discuss any questions you have with your health care provider.   Document Released: 11/29/2004 Document Revised: 03/12/2014 Document Reviewed: 06/14/2014 Elsevier Interactive Patient Education 2016 [employer] Hip Pain Your hip is the joint between your upper legs and your lower pelvis. The bones, cartilage, tendons, and muscles of your hip joint perform a lot of work each day supporting your body weight and allowing you to move around. Hip pain can range from a minor ache to severe pain in one or both of your hips. Pain may be felt on the inside of the hip joint near the groin, or the outside near the buttocks and upper thigh. You may have swelling or stiffness as well.  HOME CARE INSTRUCTIONS  Take medicines only as directed by your health care provider.  Apply ice to the injured area:  Put ice in a plastic bag.  Place a towel between your skin and the bag.  Leave the ice on for 15-20 minutes at a time, 3-4 times a day.  Keep your leg raised (elevated) when possible to lessen swelling.  Avoid activities that cause pain.  Follow specific exercises as directed by your health care provider.  Sleep with a pillow between your legs on your most comfortable side.  Record  how often you have hip pain, the location of the pain, and what it feels like. SEEK MEDICAL CARE IF:   You are unable to put weight on your leg.  Your hip is red or swollen or very tender to touch.  Your pain or swelling continues or worsens after 1 week.  You have increasing difficulty walking.  You have a fever. SEEK IMMEDIATE MEDICAL CARE IF:   You have fallen.  You have a sudden increase in pain and swelling in your hip. MAKE SURE YOU:   Understand these instructions.  Will watch your condition.  Will get help right away if you are not doing well or get worse.   This information is not intended to replace advice given to you by your health care provider. Make sure you discuss any questions you have with your health care provider.   Document Released: 08/09/2009 Document Revised: 03/12/2014 Document Reviewed: 10/16/2012 Elsevier Interactive Patient Education [employer].

## 2015-03-10 ENCOUNTER — Ambulatory Visit: Payer: Commercial Managed Care - HMO | Admitting: Family Medicine

## 2015-05-31 ENCOUNTER — Ambulatory Visit
Admission: RE | Admit: 2015-05-31 | Discharge: 2015-05-31 | Disposition: A | Discharge status: Home / Self Care | Payer: Medicare HMO | Source: Ambulatory Visit

## 2015-05-31 DIAGNOSIS — Z1231 Encounter for screening mammogram for malignant neoplasm of breast: Secondary | ICD-10-CM

## 2015-06-01 ENCOUNTER — Encounter: Payer: Self-pay | Admitting: Podiatry

## 2015-06-01 ENCOUNTER — Ambulatory Visit (INDEPENDENT_AMBULATORY_CARE_PROVIDER_SITE_OTHER): Payer: Medicare HMO | Admitting: Podiatry

## 2015-06-01 ENCOUNTER — Other Ambulatory Visit: Payer: Self-pay | Admitting: Internal Medicine

## 2015-06-01 DIAGNOSIS — R928 Other abnormal and inconclusive findings on diagnostic imaging of breast: Secondary | ICD-10-CM

## 2015-06-01 DIAGNOSIS — B351 Tinea unguium: Secondary | ICD-10-CM

## 2015-06-01 DIAGNOSIS — M79676 Pain in unspecified toe(s): Secondary | ICD-10-CM | POA: Diagnosis not present

## 2015-06-01 NOTE — Progress Notes (Signed)
Patient ID: Jacqueline Reese, female   DOB: 24-Aug-1955, 60 y.o.   MRN: [id number]  Subjective: This patient presents again complaining of painful toenails and requests debridement. 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

## 2015-06-01 NOTE — Patient Instructions (Signed)
Diabetes and Foot Care Diabetes may cause you to have problems because of poor blood supply (circulation) to your feet and legs. This may cause the skin on your feet to become thinner, break easier, and heal more slowly. Your skin may become dry, and the skin may peel and crack. You may also have nerve damage in your legs and feet causing decreased feeling in them. You may not notice minor injuries to your feet that could lead to infections or more serious problems. Taking care of your feet is one of the most important things you can do for yourself.  HOME CARE INSTRUCTIONS  Wear shoes at all times, even in the house. Do not go barefoot. Bare feet are easily injured.  Check your feet daily for blisters, cuts, and redness. If you cannot see the bottom of your feet, use a mirror or ask someone for help.  Wash your feet with warm water (do not use hot water) and mild soap. Then pat your feet and the areas between your toes until they are completely dry. Do not soak your feet as this can dry your skin.  Apply a moisturizing lotion or petroleum jelly (that does not contain alcohol and is unscented) to the skin on your feet and to dry, brittle toenails. Do not apply lotion between your toes.  Trim your toenails straight across. Do not dig under them or around the cuticle. File the edges of your nails with an emery board or nail file.  Do not cut corns or calluses or try to remove them with medicine.  Wear clean socks or stockings every day. Make sure they are not too tight. Do not wear knee-high stockings since they may decrease blood flow to your legs.  Wear shoes that fit properly and have enough cushioning. To break in new shoes, wear them for just a few hours a day. This prevents you from injuring your feet. Always look in your shoes before you put them on to be sure there are no objects inside.  Do not cross your legs. This may decrease the blood flow to your feet.  If you find a minor scrape,  cut, or break in the skin on your feet, keep it and the skin around it clean and dry. These areas may be cleansed with mild soap and water. Do not cleanse the area with peroxide, alcohol, or iodine.  When you remove an adhesive bandage, be sure not to damage the skin around it.  If you have a wound, look at it several times a day to make sure it is healing.  Do not use heating pads or hot water bottles. They may burn your skin. If you have lost feeling in your feet or legs, you may not know it is happening until it is too late.  Make sure your health care provider performs a complete foot exam at least annually or more often if you have foot problems. Report any cuts, sores, or bruises to your health care provider immediately. SEEK MEDICAL CARE IF:   You have an injury that is not healing.  You have cuts or breaks in the skin.  You have an ingrown nail.  You notice redness on your legs or feet.  You feel burning or tingling in your legs or feet.  You have pain or cramps in your legs and feet.  Your legs or feet are numb.  Your feet always feel cold. SEEK IMMEDIATE MEDICAL CARE IF:   There is increasing redness,   swelling, or pain in or around a wound.  There is a red line that goes up your leg.  Pus is coming from a wound.  You develop a fever or as directed by your health care provider.  You notice a bad smell coming from an ulcer or wound.   This information is not intended to replace advice given to you by your health care provider. Make sure you discuss any questions you have with your health care provider.   Document Released: 02/17/2000 Document Revised: 10/22/2012 Document Reviewed: 07/29/2012 Elsevier Interactive Patient Education 2016 [employer] Inc[employer]  

## 2015-06-03 ENCOUNTER — Emergency Department (HOSPITAL_COMMUNITY)
Admission: EM | Admit: 2015-06-03 | Discharge: 2015-06-03 | Disposition: A | Discharge status: Home / Self Care | Payer: Medicare HMO | Attending: Emergency Medicine | Admitting: Emergency Medicine

## 2015-06-03 ENCOUNTER — Encounter (HOSPITAL_COMMUNITY): Payer: Self-pay | Admitting: Emergency Medicine

## 2015-06-03 DIAGNOSIS — M79605 Pain in left leg: Secondary | ICD-10-CM | POA: Diagnosis not present

## 2015-06-03 DIAGNOSIS — F17210 Nicotine dependence, cigarettes, uncomplicated: Secondary | ICD-10-CM | POA: Insufficient documentation

## 2015-06-03 DIAGNOSIS — Z7982 Long term (current) use of aspirin: Secondary | ICD-10-CM | POA: Insufficient documentation

## 2015-06-03 DIAGNOSIS — Z79899 Other long term (current) drug therapy: Secondary | ICD-10-CM | POA: Diagnosis not present

## 2015-06-03 DIAGNOSIS — G8929 Other chronic pain: Secondary | ICD-10-CM | POA: Diagnosis not present

## 2015-06-03 DIAGNOSIS — Z7984 Long term (current) use of oral hypoglycemic drugs: Secondary | ICD-10-CM | POA: Insufficient documentation

## 2015-06-03 DIAGNOSIS — M1990 Unspecified osteoarthritis, unspecified site: Secondary | ICD-10-CM

## 2015-06-03 DIAGNOSIS — F329 Major depressive disorder, single episode, unspecified: Secondary | ICD-10-CM | POA: Insufficient documentation

## 2015-06-03 DIAGNOSIS — E119 Type 2 diabetes mellitus without complications: Secondary | ICD-10-CM | POA: Insufficient documentation

## 2015-06-03 DIAGNOSIS — M109 Gout, unspecified: Secondary | ICD-10-CM | POA: Diagnosis not present

## 2015-06-03 DIAGNOSIS — I10 Essential (primary) hypertension: Secondary | ICD-10-CM | POA: Insufficient documentation

## 2015-06-03 DIAGNOSIS — Z7951 Long term (current) use of inhaled steroids: Secondary | ICD-10-CM | POA: Insufficient documentation

## 2015-06-03 DIAGNOSIS — M79604 Pain in right leg: Secondary | ICD-10-CM | POA: Diagnosis present

## 2015-06-03 DIAGNOSIS — M791 Myalgia, unspecified site: Secondary | ICD-10-CM

## 2015-06-03 MED ORDER — ACETAMINOPHEN 500 MG PO TABS
1000.0000 mg | ORAL_TABLET | Freq: Once | ORAL | Status: AC
  Administered 2015-06-03: 1000 mg via ORAL
  Filled 2015-06-03: qty 2

## 2015-06-03 MED ORDER — HYDROCODONE-ACETAMINOPHEN 5-325 MG PO TABS: 1.0000 | ORAL_TABLET | ORAL | Status: DC | PRN

## 2015-06-03 NOTE — ED Provider Notes (Signed)
CSN: [id number]     Arrival date & time 06/03/15  [id number] History   First MD Initiated Contact with Patient 06/03/15 0531     Chief Complaint  Patient presents with  . Leg Pain     (Consider location/radiation/quality/duration/timing/severity/associated sxs/prior Treatment) HPI Comments: Patient presents to the ER for evaluation of bilateral leg pain. Patient is expressing pain in her thighs when she walks. Patient reports that she has a history of arthritis in her hips and has had similar pain in the past. She denies any injury. There is no pain in her calves when she walks. Patient has no history of peripheral arterial disease. Patient denies chest pain, shortness of breath associated with symptoms.  Patient is a 60 y.o. female presenting with leg pain.  Leg Pain Associated symptoms: no back pain     Past Medical History  Diagnosis Date  . Diabetes mellitus without complication ([place])   . Schizophrenic disorder ([place])   . Hypertension   . Chronic back pain   . Gout   . Depression    Past Surgical History  Procedure Laterality Date  . Cesarean section      x2   No family history on file. Social History  Substance Use Topics  . Smoking status: Current Every Day Smoker -- 0.33 packs/day    Types: Cigarettes  . Smokeless tobacco: Never Used  . Alcohol Use: No   OB History    No data available     Review of Systems  Musculoskeletal: Positive for myalgias and arthralgias. Negative for back pain.  All other systems reviewed and are negative.     Allergies  Chantix; Doxycycline; Lamisil; Prednisone; and Sulfa antibiotics  Home Medications   Prior to Admission medications   Medication Sig Start Date End Date Taking? Authorizing Provider  albuterol (ACCUNEB) 1.25 MG/3ML nebulizer solution Take 3 mLs (1.25 mg total) by nebulization every 6 (six) hours as needed for wheezing. 10/02/13   Melony Overly, MD  albuterol (PROVENTIL HFA;VENTOLIN HFA) 108 (90 BASE) MCG/ACT inhaler  Inhale 2 puffs into the lungs every 4 (four) hours as needed for wheezing or shortness of breath. 05/09/13   Janne Napoleon, NP  albuterol (PROVENTIL HFA;VENTOLIN HFA) 108 (90 BASE) MCG/ACT inhaler Inhale 2 puffs into the lungs every 4 (four) hours as needed for wheezing or shortness of breath. 09/27/13   Janne Napoleon, NP  allopurinol (ZYLOPRIM) 100 MG tablet Take 200 mg by mouth daily.    Historical Provider, MD  aspirin (ASPIR-LOW) 81 MG EC tablet Take 81 mg by mouth daily.     Historical Provider, MD  azithromycin (ZITHROMAX Z-PAK) 250 MG tablet Take 2 tablets today, then 1 tablet daily until gone. 10/02/13   Melony Overly, MD  beclomethasone (QVAR) 80 MCG/ACT inhaler Inhale 1 puff into the lungs 2 (two) times daily. 09/27/13   Janne Napoleon, NP  buPROPion [facility] SR) 150 MG 12 hr tablet Take 150 mg by mouth daily. NO INSTRUCTIONS GIVEN (per mental health)    Historical Provider, MD  clonazePAM (KLONOPIN) 1 MG tablet Take 0.5-1 mg by mouth 3 (three) times daily. Takes 1 mg in the morning and 0.5 mg in the evening and at  bedtime    Historical Provider, MD  econazole nitrate 1 % cream Apply topically daily. 12/15/12   Richard Joelene Millin, DPM  escitalopram (LEXAPRO) 20 MG tablet Take 20 mg by mouth daily.     Historical Provider, MD  Fluticasone-Salmeterol (ADVAIR DISKUS) 250-50 MCG/DOSE AEPB Inhale  1 puff into the lungs every 12 (twelve) hours. 05/09/13   Janne Napoleon, NP  glipiZIDE (GLUCOTROL XL) 2.5 MG 24 hr tablet Take 2.5 mg by mouth daily.    Historical Provider, MD  guaifenesin (ROBITUSSIN) 100 MG/5ML syrup Take 5-10 mLs (100-200 mg total) by mouth every 4 (four) hours as needed for cough. 03/02/12   Davonna Belling, MD  haloperidol (HALDOL) 2 MG tablet Take 2 mg by mouth 2 (two) times daily. per psych    Historical Provider, MD  hydrochlorothiazide 25 MG tablet Take 25 mg by mouth every morning.     Historical Provider, MD  HYDROcodone-acetaminophen (NORCO) 5-325 MG tablet Take 1 tablet by mouth every 6  (six) hours as needed for severe pain. 02/16/15   [name], PA-C  ibuprofen (ADVIL,MOTRIN) 800 MG tablet Take 800 mg by mouth every 8 (eight) hours as needed. For pain    Historical Provider, MD  lisinopril (PRINIVIL,ZESTRIL) 10 MG tablet Take 10 mg by mouth daily.  04/05/13   Historical Provider, MD  metFORMIN (GLUCOPHAGE) 1000 MG tablet Take 1,000 mg by mouth 2 (two) times daily with a meal.  01/06/11   Candelaria Celeste, MD  mirtazapine (REMERON) 15 MG tablet Take 15 mg by mouth daily.  02/03/13   Historical Provider, MD  omeprazole (PRILOSEC) 20 MG capsule Take 20 mg by mouth daily as needed. For heartburn    Historical Provider, MD  spironolactone (ALDACTONE) 25 MG tablet Take 25 mg by mouth daily. 01/27/14   Historical Provider, MD  terbinafine (LAMISIL) 250 MG tablet Take 1 tablet (250 mg total) by mouth daily. 04/10/13   Bronson Ing, DPM  ziprasidone (GEODON) 80 MG capsule Take 240 mg by mouth every evening. (per mental health)    Historical Provider, MD   BP 148/83 mmHg  Pulse 63  Temp(Src) 98.8 F (37.1 C) (Oral)  Resp 18  Ht 5\' 4"  (1.626 m)  Wt 194 lb (87.998 kg)  BMI 33.28 kg/m2  SpO2 100%  LMP 05/09/2010 Physical Exam  Constitutional: She is oriented to person, place, and time. She appears well-developed and well-nourished. No distress.  HENT:  Head: Normocephalic and atraumatic.  Right Ear: Hearing normal.  Left Ear: Hearing normal.  Nose: Nose normal.  Mouth/Throat: Oropharynx is clear and moist and mucous membranes are normal.  Eyes: Conjunctivae and EOM are normal. Pupils are equal, round, and reactive to light.  Neck: Normal range of motion. Neck supple.  Cardiovascular: Regular rhythm, S1 normal and S2 normal.  Exam reveals no gallop and no friction rub.   No murmur heard. Pulses:      Dorsalis pedis pulses are 1+ on the right side, and 1+ on the left side.  Pulmonary/Chest: Effort normal and breath sounds normal. No respiratory distress. She exhibits no  tenderness.  Abdominal: Soft. Normal appearance and bowel sounds are normal. There is no hepatosplenomegaly. There is no tenderness. There is no rebound, no guarding, no tenderness at McBurney's point and negative Murphy's sign. No hernia.  Musculoskeletal: Normal range of motion.       Right hip: She exhibits normal range of motion and no deformity.       Left hip: She exhibits normal range of motion and no deformity.       Right upper leg: She exhibits tenderness. She exhibits no swelling.       Left upper leg: She exhibits tenderness. She exhibits no swelling.       Right lower leg: She exhibits no tenderness  and no swelling.       Left lower leg: She exhibits no tenderness and no swelling.  Neurological: She is alert and oriented to person, place, and time. She has normal strength. No cranial nerve deficit or sensory deficit. Coordination normal. GCS eye subscore is 4. GCS verbal subscore is 5. GCS motor subscore is 6.  Skin: Skin is warm, dry and intact. No rash noted. No cyanosis.  Psychiatric: She has a normal mood and affect. Her speech is normal and behavior is normal. Thought content normal.  Nursing note and vitals reviewed.   ED Course  Procedures (including critical care time) Labs Review Labs Reviewed - No data to display  Imaging Review No results found. I have personally reviewed and evaluated these images and lab results as part of my medical decision-making.   EKG Interpretation None      MDM   Final diagnoses:  None   arthritis Muscle pain  Patient presents with joint pain and proximal muscle pain in her legs. There is no associated back pain. She has normal sensation and strength in lower extremities. No foot drop or saddle anesthesia. This does not appear to be secondary to radiculopathy. She reports a history of arthritis with similar pain. She has not had any injury. There is no imaging necessary. Patient does not have any swelling of the legs. There is no  calf swelling or tenderness. This does not suggest DVT. She has normal skin color, no signs of cellulitis or infection. She has palpable pulses in her feet. Patient will be treated with analgesia.    Orpah Greek, MD 06/03/15 [phone]

## 2015-06-03 NOTE — ED Notes (Signed)
Patient requests generic medications for prescription. Notated for MD.

## 2015-06-03 NOTE — ED Notes (Signed)
C/o bil paik in upper legs.  Seen by pcp on Friday and given a muscle relaxer.  States it's not helping at all.

## 2015-06-03 NOTE — Discharge Instructions (Signed)

## 2015-06-08 ENCOUNTER — Ambulatory Visit: Payer: Medicare Other

## 2015-06-09 ENCOUNTER — Other Ambulatory Visit: Payer: Medicare HMO

## 2015-06-09 ENCOUNTER — Ambulatory Visit
Admission: RE | Admit: 2015-06-09 | Discharge: 2015-06-09 | Disposition: A | Discharge status: Home / Self Care | Payer: Medicare HMO | Source: Ambulatory Visit | Attending: Internal Medicine | Admitting: Internal Medicine

## 2015-06-09 DIAGNOSIS — R928 Other abnormal and inconclusive findings on diagnostic imaging of breast: Secondary | ICD-10-CM

## 2015-06-10 ENCOUNTER — Other Ambulatory Visit: Payer: Medicare HMO

## 2015-06-15 ENCOUNTER — Emergency Department (HOSPITAL_BASED_OUTPATIENT_CLINIC_OR_DEPARTMENT_OTHER)
Admit: 2015-06-15 | Discharge: 2015-06-15 | Disposition: A | Discharge status: Home / Self Care | Payer: Medicare HMO | Attending: Emergency Medicine | Admitting: Emergency Medicine

## 2015-06-15 ENCOUNTER — Emergency Department (HOSPITAL_COMMUNITY): Payer: Medicare HMO

## 2015-06-15 ENCOUNTER — Encounter (HOSPITAL_COMMUNITY): Payer: Self-pay | Admitting: Emergency Medicine

## 2015-06-15 ENCOUNTER — Emergency Department (HOSPITAL_COMMUNITY)
Admission: EM | Admit: 2015-06-15 | Discharge: 2015-06-15 | Disposition: A | Discharge status: Home / Self Care | Payer: Medicare HMO | Attending: Emergency Medicine | Admitting: Emergency Medicine

## 2015-06-15 DIAGNOSIS — G8929 Other chronic pain: Secondary | ICD-10-CM | POA: Diagnosis not present

## 2015-06-15 DIAGNOSIS — Z7982 Long term (current) use of aspirin: Secondary | ICD-10-CM | POA: Insufficient documentation

## 2015-06-15 DIAGNOSIS — R600 Localized edema: Secondary | ICD-10-CM | POA: Diagnosis not present

## 2015-06-15 DIAGNOSIS — I10 Essential (primary) hypertension: Secondary | ICD-10-CM | POA: Diagnosis not present

## 2015-06-15 DIAGNOSIS — M25552 Pain in left hip: Secondary | ICD-10-CM | POA: Diagnosis not present

## 2015-06-15 DIAGNOSIS — Z79899 Other long term (current) drug therapy: Secondary | ICD-10-CM | POA: Insufficient documentation

## 2015-06-15 DIAGNOSIS — M79604 Pain in right leg: Secondary | ICD-10-CM | POA: Diagnosis present

## 2015-06-15 DIAGNOSIS — F209 Schizophrenia, unspecified: Secondary | ICD-10-CM | POA: Insufficient documentation

## 2015-06-15 DIAGNOSIS — M25551 Pain in right hip: Secondary | ICD-10-CM | POA: Insufficient documentation

## 2015-06-15 DIAGNOSIS — E119 Type 2 diabetes mellitus without complications: Secondary | ICD-10-CM | POA: Diagnosis not present

## 2015-06-15 DIAGNOSIS — F329 Major depressive disorder, single episode, unspecified: Secondary | ICD-10-CM | POA: Diagnosis not present

## 2015-06-15 DIAGNOSIS — M109 Gout, unspecified: Secondary | ICD-10-CM | POA: Insufficient documentation

## 2015-06-15 DIAGNOSIS — F17210 Nicotine dependence, cigarettes, uncomplicated: Secondary | ICD-10-CM | POA: Diagnosis not present

## 2015-06-15 DIAGNOSIS — M79609 Pain in unspecified limb: Secondary | ICD-10-CM

## 2015-06-15 DIAGNOSIS — Z7951 Long term (current) use of inhaled steroids: Secondary | ICD-10-CM | POA: Insufficient documentation

## 2015-06-15 DIAGNOSIS — M2550 Pain in unspecified joint: Secondary | ICD-10-CM

## 2015-06-15 DIAGNOSIS — M25559 Pain in unspecified hip: Secondary | ICD-10-CM

## 2015-06-15 LAB — D-DIMER, QUANTITATIVE: D-Dimer, Quant: 1.64 ug/mL-FEU — ABNORMAL HIGH (ref 0.00–0.50)

## 2015-06-15 MED ORDER — HYDROCODONE-ACETAMINOPHEN 5-325 MG PO TABS
1.0000 | ORAL_TABLET | Freq: Once | ORAL | Status: DC
Start: 2015-06-15 — End: 2015-06-15
  Filled 2015-06-15: qty 1

## 2015-06-15 MED ORDER — NAPROXEN 500 MG PO TABS: 500.0000 mg | ORAL_TABLET | Freq: Two times a day (BID) | ORAL | Status: DC

## 2015-06-15 MED ORDER — ACETAMINOPHEN 325 MG PO TABS
650.0000 mg | ORAL_TABLET | Freq: Once | ORAL | Status: AC
  Administered 2015-06-15: 650 mg via ORAL
  Filled 2015-06-15: qty 2

## 2015-06-15 NOTE — Progress Notes (Signed)
VASCULAR LAB PRELIMINARY  PRELIMINARY  PRELIMINARY  PRELIMINARY  Bilateral lower extremity venous duplex  completed.    Preliminary report:  Bilateral:  No evidence of DVT, superficial thrombosis, or Baker's Cyst.    Ayrton Mcvay, RVT 06/15/2015, 8:24 AM

## 2015-06-15 NOTE — ED Provider Notes (Signed)
CSN: [id number]     Arrival date & time 06/15/15  0340 History   First MD Initiated Contact with Patient 06/15/15 [phone]     Chief Complaint  Patient presents with  . Leg Pain     (Consider location/radiation/quality/duration/timing/severity/associated sxs/prior Treatment) HPI  This 60 year old female with a history of diabetes, schizophrenia, hypertension who presents with bilateral leg pain. Patient reports right greater than left leg pain. Ongoing for the last several weeks. She was seen and evaluated on March 31. At that time she was prescribed a short course of Norco. She states that her pain improved but came back when her prescription ran out. She has a history of chronic arthritis. When asked where her pain is, she states "my whole leg." It appears her pain is more proximal and mostly her right leg. She denies any swelling. Pain is worse with ambulation. She denies any new injury. Patient denies any radiation of pain. She denies any back pain, weakness, numbness, tingling.  Past Medical History  Diagnosis Date  . Diabetes mellitus without complication ([place])   . Schizophrenic disorder ([place])   . Hypertension   . Chronic back pain   . Gout   . Depression    Past Surgical History  Procedure Laterality Date  . Cesarean section      x2   No family history on file. Social History  Substance Use Topics  . Smoking status: Current Every Day Smoker -- 0.33 packs/day    Types: Cigarettes  . Smokeless tobacco: Never Used  . Alcohol Use: No   OB History    No data available     Review of Systems  Constitutional: Negative for fever.  Cardiovascular: Negative for leg swelling.  Musculoskeletal: Negative for back pain.       Leg pain  Skin: Negative for rash.  Neurological: Negative for weakness and numbness.  All other systems reviewed and are negative.     Allergies  Chantix; Doxycycline; Lamisil; Prednisone; and Sulfa antibiotics  Home Medications   Prior to Admission  medications   Medication Sig Start Date End Date Taking? Authorizing Provider  allopurinol (ZYLOPRIM) 100 MG tablet Take 200 mg by mouth daily.   Yes Historical Provider, MD  aspirin EC 325 MG tablet Take 325 mg by mouth daily.   Yes Historical Provider, MD  buPROPion (WELLBUTRIN XL) 150 MG 24 hr tablet Take 150 mg by mouth daily.   Yes Historical Provider, MD  clonazePAM (KLONOPIN) 1 MG tablet Take 1 mg by mouth 4 (four) times daily.    Yes Historical Provider, MD  cyclobenzaprine (FLEXERIL) 10 MG tablet Take 10 mg by mouth 3 (three) times daily as needed for muscle spasms.   Yes Historical Provider, MD  escitalopram (LEXAPRO) 20 MG tablet Take 20 mg by mouth daily.    Yes Historical Provider, MD  gabapentin (NEURONTIN) 100 MG capsule Take 100 mg by mouth 2 (two) times daily.   Yes Historical Provider, MD  glipiZIDE (GLUCOTROL XL) 2.5 MG 24 hr tablet Take 2.5 mg by mouth daily.   Yes Historical Provider, MD  lisinopril (PRINIVIL,ZESTRIL) 10 MG tablet Take 10 mg by mouth daily.  04/05/13  Yes Historical Provider, MD  metFORMIN (GLUCOPHAGE) 500 MG tablet Take 500 mg by mouth 2 (two) times daily with a meal.   Yes Historical Provider, MD  mirtazapine (REMERON) 15 MG tablet Take 15 mg by mouth daily.  02/03/13  Yes Historical Provider, MD  omeprazole (PRILOSEC) 20 MG capsule Take 20 mg  by mouth daily as needed. For heartburn   Yes Historical Provider, MD  spironolactone (ALDACTONE) 50 MG tablet Take 50 mg by mouth daily.   Yes Historical Provider, MD  ziprasidone (GEODON) 80 MG capsule Take 80 mg by mouth 3 (three) times daily. (per mental health)   Yes Historical Provider, MD  albuterol (ACCUNEB) 1.25 MG/3ML nebulizer solution Take 3 mLs (1.25 mg total) by nebulization every 6 (six) hours as needed for wheezing. Patient not taking: Reported on 06/15/2015 10/02/13   Melony Overly, MD  albuterol (PROVENTIL HFA;VENTOLIN HFA) 108 (90 BASE) MCG/ACT inhaler Inhale 2 puffs into the lungs every 4 (four) hours as  needed for wheezing or shortness of breath. Patient not taking: Reported on 06/15/2015 05/09/13   Janne Napoleon, NP  albuterol (PROVENTIL HFA;VENTOLIN HFA) 108 (90 BASE) MCG/ACT inhaler Inhale 2 puffs into the lungs every 4 (four) hours as needed for wheezing or shortness of breath. Patient not taking: Reported on 06/15/2015 09/27/13   Janne Napoleon, NP  azithromycin (ZITHROMAX Z-PAK) 250 MG tablet Take 2 tablets today, then 1 tablet daily until gone. Patient not taking: Reported on 06/15/2015 10/02/13   Melony Overly, MD  beclomethasone (QVAR) 80 MCG/ACT inhaler Inhale 1 puff into the lungs 2 (two) times daily. Patient not taking: Reported on 06/15/2015 09/27/13   Janne Napoleon, NP  econazole nitrate 1 % cream Apply topically daily. Patient not taking: Reported on 06/15/2015 12/15/12   Gean Birchwood, DPM  Fluticasone-Salmeterol (ADVAIR DISKUS) 250-50 MCG/DOSE AEPB Inhale 1 puff into the lungs every 12 (twelve) hours. Patient not taking: Reported on 06/15/2015 05/09/13   Janne Napoleon, NP  guaifenesin (ROBITUSSIN) 100 MG/5ML syrup Take 5-10 mLs (100-200 mg total) by mouth every 4 (four) hours as needed for cough. Patient not taking: Reported on 06/15/2015 03/02/12   Davonna Belling, MD  HYDROcodone-acetaminophen (NORCO/VICODIN) 5-325 MG tablet Take 1-2 tablets by mouth every 4 (four) hours as needed for moderate pain. Patient not taking: Reported on 06/15/2015 06/03/15   Orpah Greek, MD  terbinafine (LAMISIL) 250 MG tablet Take 1 tablet (250 mg total) by mouth daily. Patient not taking: Reported on 06/15/2015 04/10/13   Bronson Ing, DPM   BP 139/72 mmHg  Pulse 76  Temp(Src) 98.2 F (36.8 C) (Oral)  Resp 20  Ht 5\' 2"  (1.575 m)  Wt 194 lb (87.998 kg)  BMI 35.47 kg/m2  SpO2 100%  LMP 05/09/2010 Physical Exam  Constitutional: She is oriented to person, place, and time. She appears well-developed and well-nourished.  HENT:  Head: Normocephalic and atraumatic.  Cardiovascular: Normal rate and regular  rhythm.   Pulmonary/Chest: Effort normal. No respiratory distress.  Musculoskeletal:  Trace bilateral lower extremity edema, symmetric, no obvious deformities, pain with range of motion of the right hip, no overlying skin changes, no tenderness to palpation of calf  Neurological: She is alert and oriented to person, place, and time.  Skin: Skin is warm and dry.  Psychiatric: She has a normal mood and affect.  Nursing note and vitals reviewed.   ED Course  Procedures (including critical care time) Labs Review Labs Reviewed  D-DIMER, QUANTITATIVE (NOT AT [facility]) - Abnormal; Notable for the following:    D-Dimer, Quant 1.64 (*)    All other components within normal limits    Imaging Review Dg Hip Unilat With Pelvis 2-3 Views Right  06/15/2015  CLINICAL DATA:  RIGHT hip pain radiating to RIGHT leg for 2 months. History of diabetes, hypertension schizophrenia. EXAM: DG HIP (  WITH OR WITHOUT PELVIS) 2-3V RIGHT COMPARISON:  None available, examination interpretedduring PACS downtime. FINDINGS: No acute fracture deformity or dislocation. Severe RIGHT hip joint space narrowing with periarticular sclerosis and marginal spurring, moderate to severe on the LEFT. No femoral head collapse. No destructive bony lesions. Soft tissue planes are normal. IMPRESSION: Severe RIGHT and moderate to severe LEFT hip osteoarthrosis. No acute fracture deformity or dislocation. Electronically Signed   By: Elon Alas M.D.   On: 06/15/2015 05:58   I have personally reviewed and evaluated these images and lab results as part of my medical decision-making.   EKG Interpretation None      MDM   Final diagnoses:  Hip pain    Patient presents with right greater than left hip pain. Acute on chronic. Nontoxic on exam. No deformities. Plain films of left hip show severe right greater than left hip osteoarthrosis. Screening d-dimer sent and is elevated. Patient will need a formal ultrasound of her  leg.    Merryl Hacker, MD 06/15/15 [phone]

## 2015-06-15 NOTE — ED Provider Notes (Signed)
Doppler negative. Patient stable for discharge per plan established by Dr. Dina Rich.  BP 127/89 mmHg  Pulse 53  Temp(Src) 98.2 F (36.8 C) (Oral)  Resp 20  Ht 5\' 2"  (1.575 m)  Wt 194 lb (87.998 kg)  BMI 35.47 kg/m2  SpO2 98%  LMP 05/09/2010   Ezequiel Essex, MD 06/15/15 [phone]

## 2015-06-15 NOTE — ED Notes (Signed)
Patient here with bilateral leg pain, patient states that the pain is bad that she can not walk.  Patient drove to ED.  Patient is requesting refill on her Vicodin prescription.  Patient states her PCP will not refill her prescription.  She is also requesting to have a handicap placard for her car.

## 2015-06-15 NOTE — Discharge Instructions (Signed)
Muscle Pain, Adult Follow up with your doctor. Return to the ED if you develop new or worsening symptoms. Muscle pain (myalgia) may be caused by many things, including:  Overuse or muscle strain, especially if you are not in shape. This is the most common cause of muscle pain.  Injury.  Bruises.  Viruses, such as the flu.  Infectious diseases.  Fibromyalgia, which is a chronic condition that causes muscle tenderness, fatigue, and headache.  Autoimmune diseases, including lupus.  Certain drugs, including ACE inhibitors and statins. Muscle pain may be mild or severe. In most cases, the pain lasts only a short time and goes away without treatment. To diagnose the cause of your muscle pain, your health care provider will take your medical history. This means he or she will ask you when your muscle pain began and what has been happening. If you have not had muscle pain for very long, your health care provider may want to wait before doing much testing. If your muscle pain has lasted a long time, your health care provider may want to run tests right away. If your health care provider thinks your muscle pain may be caused by illness, you may need to have additional tests to rule out certain conditions.  Treatment for muscle pain depends on the cause. Home care is often enough to relieve muscle pain. Your health care provider may also prescribe anti-inflammatory medicine. HOME CARE INSTRUCTIONS Watch your condition for any changes. The following actions may help to lessen any discomfort you are feeling:  Only take over-the-counter or prescription medicines as directed by your health care provider.  Apply ice to the sore muscle:  Put ice in a plastic bag.  Place a towel between your skin and the bag.  Leave the ice on for 15-20 minutes, 3-4 times a day.  You may alternate applying hot and cold packs to the muscle as directed by your health care provider.  If overuse is causing your muscle  pain, slow down your activities until the pain goes away.  Remember that it is normal to feel some muscle pain after starting a workout program. Muscles that have not been used often will be sore at first.  Do regular, gentle exercises if you are not usually active.  Warm up before exercising to lower your risk of muscle pain.  Do not continue working out if the pain is very bad. Bad pain could mean you have injured a muscle. SEEK MEDICAL CARE IF:  Your muscle pain gets worse, and medicines do not help.  You have muscle pain that lasts longer than 3 days.  You have a rash or fever along with muscle pain.  You have muscle pain after a tick bite.  You have muscle pain while working out, even though you are in good physical condition.  You have redness, soreness, or swelling along with muscle pain.  You have muscle pain after starting a new medicine or changing the dose of a medicine. SEEK IMMEDIATE MEDICAL CARE IF:  You have trouble breathing.  You have trouble swallowing.  You have muscle pain along with a stiff neck, fever, and vomiting.  You have severe muscle weakness or cannot move part of your body. MAKE SURE YOU:   Understand these instructions.  Will watch your condition.  Will get help right away if you are not doing well or get worse.   This information is not intended to replace advice given to you by your health care provider. Make  sure you discuss any questions you have with your health care provider.   Document Released: 01/11/2006 Document Revised: 03/12/2014 Document Reviewed: 12/16/2012 Elsevier Interactive Patient Education [employer].

## 2015-06-15 NOTE — ED Notes (Signed)
Patient transported to Ultrasound 

## 2015-06-21 ENCOUNTER — Encounter (HOSPITAL_COMMUNITY): Payer: Self-pay | Admitting: Emergency Medicine

## 2015-06-21 ENCOUNTER — Ambulatory Visit (HOSPITAL_COMMUNITY)
Admission: EM | Admit: 2015-06-21 | Discharge: 2015-06-21 | Disposition: A | Discharge status: Home / Self Care | Payer: Medicare HMO | Attending: Family Medicine | Admitting: Family Medicine

## 2015-06-21 DIAGNOSIS — M5441 Lumbago with sciatica, right side: Secondary | ICD-10-CM | POA: Diagnosis not present

## 2015-06-21 MED ORDER — GABAPENTIN 400 MG PO CAPS: 400.0000 mg | ORAL_CAPSULE | Freq: Three times a day (TID) | ORAL | Status: DC

## 2015-06-21 MED ORDER — HYDROCODONE-ACETAMINOPHEN 5-325 MG PO TABS: 2.0000 | ORAL_TABLET | ORAL | Status: DC | PRN

## 2015-06-21 NOTE — ED Notes (Signed)
PT reports bilateral leg pain that originates in lower back and shoots down her legs. PT reports symptoms have been present for a week. In the past, Pt has received "cortisone shots" for this.

## 2015-06-21 NOTE — Discharge Instructions (Signed)

## 2015-06-21 NOTE — ED Provider Notes (Signed)
CSN: [id number]     Arrival date & time 06/21/15  1338 History   First MD Initiated Contact with Patient 06/21/15 1507     Chief Complaint  Patient presents with  . Leg Pain   (Consider location/radiation/quality/duration/timing/severity/associated sxs/prior Treatment) HPI  Past Medical History  Diagnosis Date  . Diabetes mellitus without complication ([place])   . Schizophrenic disorder ([place])   . Hypertension   . Chronic back pain   . Gout   . Depression    Past Surgical History  Procedure Laterality Date  . Cesarean section      x2   No family history on file. Social History  Substance Use Topics  . Smoking status: Current Every Day Smoker -- 0.33 packs/day    Types: Cigarettes  . Smokeless tobacco: Never Used  . Alcohol Use: No   OB History    No data available     Review of Systems  Allergies  Chantix; Doxycycline; Ibuprofen; Lamisil; Prednisone; and Sulfa antibiotics  Home Medications   Prior to Admission medications   Medication Sig Start Date End Date Taking? Authorizing Provider  albuterol (ACCUNEB) 1.25 MG/3ML nebulizer solution Take 3 mLs (1.25 mg total) by nebulization every 6 (six) hours as needed for wheezing. Patient not taking: Reported on 06/15/2015 10/02/13   Melony Overly, MD  albuterol (PROVENTIL HFA;VENTOLIN HFA) 108 (90 BASE) MCG/ACT inhaler Inhale 2 puffs into the lungs every 4 (four) hours as needed for wheezing or shortness of breath. Patient not taking: Reported on 06/15/2015 05/09/13   Janne Napoleon, NP  albuterol (PROVENTIL HFA;VENTOLIN HFA) 108 (90 BASE) MCG/ACT inhaler Inhale 2 puffs into the lungs every 4 (four) hours as needed for wheezing or shortness of breath. Patient not taking: Reported on 06/15/2015 09/27/13   Janne Napoleon, NP  allopurinol (ZYLOPRIM) 100 MG tablet Take 200 mg by mouth daily.    Historical Provider, MD  aspirin EC 325 MG tablet Take 325 mg by mouth daily.    Historical Provider, MD  azithromycin (ZITHROMAX Z-PAK) 250 MG tablet  Take 2 tablets today, then 1 tablet daily until gone. Patient not taking: Reported on 06/15/2015 10/02/13   Melony Overly, MD  beclomethasone (QVAR) 80 MCG/ACT inhaler Inhale 1 puff into the lungs 2 (two) times daily. Patient not taking: Reported on 06/15/2015 09/27/13   Janne Napoleon, NP  buPROPion (WELLBUTRIN XL) 150 MG 24 hr tablet Take 150 mg by mouth daily.    Historical Provider, MD  clonazePAM (KLONOPIN) 1 MG tablet Take 1 mg by mouth 4 (four) times daily.     Historical Provider, MD  cyclobenzaprine (FLEXERIL) 10 MG tablet Take 10 mg by mouth 3 (three) times daily as needed for muscle spasms.    Historical Provider, MD  econazole nitrate 1 % cream Apply topically daily. Patient not taking: Reported on 06/15/2015 12/15/12   Gean Birchwood, DPM  escitalopram (LEXAPRO) 20 MG tablet Take 20 mg by mouth daily.     Historical Provider, MD  Fluticasone-Salmeterol (ADVAIR DISKUS) 250-50 MCG/DOSE AEPB Inhale 1 puff into the lungs every 12 (twelve) hours. Patient not taking: Reported on 06/15/2015 05/09/13   Janne Napoleon, NP  gabapentin (NEURONTIN) 100 MG capsule Take 100 mg by mouth 2 (two) times daily.    Historical Provider, MD  gabapentin (NEURONTIN) 400 MG capsule Take 1 capsule (400 mg total) by mouth 3 (three) times daily. 06/21/15   Konrad Felix, PA  glipiZIDE (GLUCOTROL XL) 2.5 MG 24 hr tablet Take 2.5 mg by  mouth daily.    Historical Provider, MD  guaifenesin (ROBITUSSIN) 100 MG/5ML syrup Take 5-10 mLs (100-200 mg total) by mouth every 4 (four) hours as needed for cough. Patient not taking: Reported on 06/15/2015 03/02/12   Davonna Belling, MD  HYDROcodone-acetaminophen (NORCO/VICODIN) 5-325 MG tablet Take 1-2 tablets by mouth every 4 (four) hours as needed for moderate pain. Patient not taking: Reported on 06/15/2015 06/03/15   Orpah Greek, MD  HYDROcodone-acetaminophen (NORCO/VICODIN) 5-325 MG tablet Take 2 tablets by mouth every 4 (four) hours as needed. 06/21/15   Konrad Felix, PA   lisinopril (PRINIVIL,ZESTRIL) 10 MG tablet Take 10 mg by mouth daily.  04/05/13   Historical Provider, MD  metFORMIN (GLUCOPHAGE) 500 MG tablet Take 500 mg by mouth 2 (two) times daily with a meal.    Historical Provider, MD  mirtazapine (REMERON) 15 MG tablet Take 15 mg by mouth daily.  02/03/13   Historical Provider, MD  naproxen (NAPROSYN) 500 MG tablet Take 1 tablet (500 mg total) by mouth 2 (two) times daily. 06/15/15   Ezequiel Essex, MD  omeprazole (PRILOSEC) 20 MG capsule Take 20 mg by mouth daily as needed. For heartburn    Historical Provider, MD  spironolactone (ALDACTONE) 50 MG tablet Take 50 mg by mouth daily.    Historical Provider, MD  terbinafine (LAMISIL) 250 MG tablet Take 1 tablet (250 mg total) by mouth daily. Patient not taking: Reported on 06/15/2015 04/10/13   Bronson Ing, DPM  ziprasidone (GEODON) 80 MG capsule Take 80 mg by mouth 3 (three) times daily. (per mental health)    Historical Provider, MD   Meds Ordered and Administered this Visit  Medications - No data to display  BP 132/67 mmHg  Pulse 61  Temp(Src) 97.8 F (36.6 C) (Oral)  Resp 16  SpO2 96%  LMP 05/09/2010 No data found.   Physical Exam NURSES NOTES AND VITAL SIGNS REVIEWED. CONSTITUTIONAL: Well developed, well nourished, no acute distress HEENT: normocephalic, atraumatic EYES: Conjunctiva normal NECK:normal ROM, supple, no adenopathy PULMONARY:No respiratory distress, normal effort MUSCULOSKELETAL: Normal ROM of all extremities, BACK: minimal tenderness of right lumbar spine. With some tenderness into right buttock. SLR Right is positive.  SKIN: warm and dry without rash PSYCHIATRIC: Mood and affect, behavior are normal  ED Course  Procedures (including critical care time)  Labs Review Labs Reviewed - No data to display  Imaging Review No results found.   Visual Acuity Review  Right Eye Distance:   Left Eye Distance:   Bilateral Distance:    Right Eye Near:   Left Eye Near:     Bilateral Near:       RS gabapentin/norco  MDM   1. Low back pain with right-sided sciatica, unspecified back pain laterality     Patient is reassured that there are no issues that require transfer to higher level of care at this time or additional tests. Patient is advised to continue home symptomatic treatment. Patient is advised that if there are new or worsening symptoms to attend the emergency department, contact primary care provider, or return to UC. Instructions of care provided discharged home in stable condition.    THIS NOTE WAS GENERATED USING A VOICE RECOGNITION SOFTWARE PROGRAM. ALL REASONABLE EFFORTS  WERE MADE TO PROOFREAD THIS DOCUMENT FOR ACCURACY.  I have verbally reviewed the discharge instructions with the patient. A printed AVS was given to the patient.  All questions were answered prior to discharge.      Konrad Felix,  PA 06/21/15 1625

## 2015-07-25 ENCOUNTER — Telehealth: Payer: Self-pay | Admitting: *Deleted

## 2015-07-25 NOTE — Telephone Encounter (Signed)
Pt states she has a split under her toe and wanted to know what to do.  I told pt to cover the area with a light bit of Neosporin and a white cotton sock if it was difficult to put gauze on and I would get her in to see Dr. Amalia Hailey on Tuesday.  Pt states she can't come in on Tuesday, but could on Wednesday.  I told pt I would have a scheduler call her on Tuesday and get her an appt for Wednesday, if the area worsened go to the ER.  Pt states understanding.

## 2015-07-27 ENCOUNTER — Ambulatory Visit (INDEPENDENT_AMBULATORY_CARE_PROVIDER_SITE_OTHER): Payer: Medicare HMO | Admitting: Podiatry

## 2015-07-27 ENCOUNTER — Encounter: Payer: Self-pay | Admitting: Podiatry

## 2015-07-27 VITALS — BP 125/79 | HR 80 | Resp 12

## 2015-07-27 DIAGNOSIS — L89891 Pressure ulcer of other site, stage 1: Secondary | ICD-10-CM | POA: Diagnosis not present

## 2015-07-27 DIAGNOSIS — L97521 Non-pressure chronic ulcer of other part of left foot limited to breakdown of skin: Secondary | ICD-10-CM

## 2015-07-27 MED ORDER — CEPHALEXIN 500 MG PO CAPS: 500.0000 mg | ORAL_CAPSULE | Freq: Three times a day (TID) | ORAL | Status: DC

## 2015-07-27 NOTE — Patient Instructions (Signed)
Betadine Soak Instructions  Purchase an 8 oz. bottle of BETADINE solution (Povidone)  THE DAY AFTER THE PROCEDURE  Place 1 tablespoon of betadine solution in a quart of warm tap water.  Submerge your foot or feet with outer bandage intact for the initial soak; this will allow the bandage to become moist and wet for easy lift off.  Once you remove your bandage, continue to soak in the solution for 3 minutes.  This soak should be done twice a day.  Next, remove your foot or feet from solution, blot dry the affected area and cover.  Apply topical antibiotic ointment after soaking in between first and second toes IF YOUR SKIN BECOMES IRRITATED WHILE USING THESE INSTRUCTIONS, IT IS OKAY TO SWITCH TO EPSOM SALTS AND WATER OR WHITE VINEGAR AND WATER.    Begin taking oral antibiotics 1 capsule 3 times a day 7 days Wear the surgical shoe on your left foot

## 2015-07-27 NOTE — Progress Notes (Signed)
   Subjective:    Patient ID: Jacqueline Reese, female    DOB: 06/13/1955, 60 y.o.   MRN: [id number]  HPI   This patient presents today with approximately one-week history of a sudden onset of blister like formation and breakdown in the left first web space. He said that she has some discomfort in the area and is applying Neosporin to the area on a daily basis. She denies any fever or chills Patient has a known diabetic and denies history of previous skin ulceration, claudication or amputation patient has been seen and ongoing basis for debridement of mycotic toenails   Review of Systems  Cardiovascular: Positive for leg swelling.  Musculoskeletal: Positive for gait problem.  Skin: Positive for color change.       Objective:   Physical Exam  Orientated 3 Objective: Vascular: DP and PT pulses 2/4 bilaterally Capillary reflex immediate bilaterally  Neurological: Sensation to 10 g monofilament wire intact 5/5 bilaterally Vibratory sensation reactive bilaterally Ankle reflex equal and reactive bilaterally  Dermatological:\ The first left web space is macerated and broken down into a superficial ulcer measuring 2.0 cm with a granular base. There is no malodor ,active drainage noted., warmth or erythema   Musculoskeletal: HAV deformities bilaterally       Assessment & Plan:   Assessment: Diabetic with satisfactory neurovascular status Blister formation with tissue breakdown and superficial ulceration first left web space  Plan: General debridement of the macerated tissue with application of topical antibiotic ointment to the area Patient will begin daily diluted Betadine soaks with daily application of topical antibiotic ointment and wear white cotton sock Rx cephalexin 500 mg #21 by mouth 3 times a day A surgical shoe was dispensed to wear in the left foot  Reappoint 7 days

## 2015-07-28 IMAGING — MG MM SCREENING BREAST TOMO BILATERAL
1 series · 1 of 1 positions shown · non-contrast
Comparison: Previous exam(s).

CLINICAL DATA: Screening.

EXAM:
DIGITAL SCREENING BILATERAL MAMMOGRAM WITH 3D TOMO WITH CAD

[R CV]
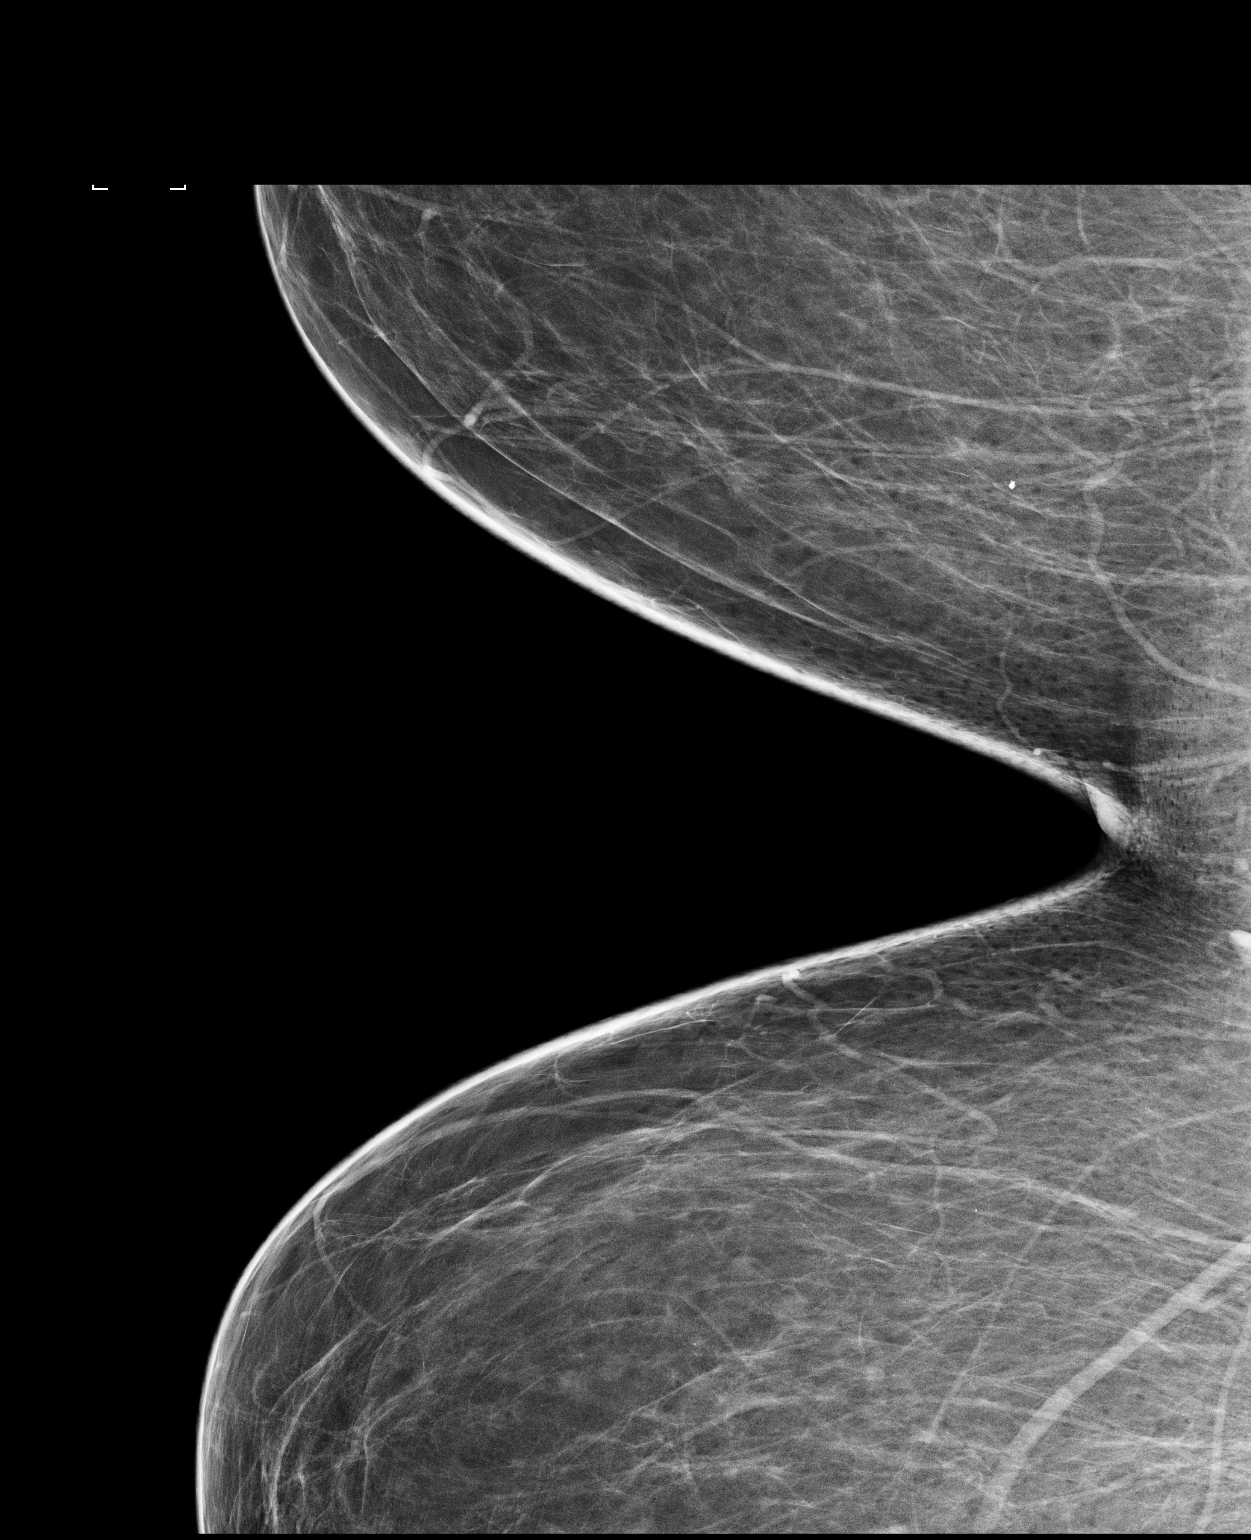

[1 of 1 positions shown; findings below may reference images not displayed]

ACR Breast Density Category b: There are scattered areas of
fibroglandular density.
FINDINGS: There are no findings suspicious for malignancy. Images were
processed with CAD.
IMPRESSION: No mammographic evidence of malignancy. A result letter of this
screening mammogram will be mailed directly to the patient.

RECOMMENDATION:
Screening mammogram in one year. (Code:55-L-23V)

BI-RADS CATEGORY  1: Negative.

## 2015-08-03 ENCOUNTER — Encounter: Payer: Self-pay | Admitting: Podiatry

## 2015-08-03 ENCOUNTER — Ambulatory Visit (INDEPENDENT_AMBULATORY_CARE_PROVIDER_SITE_OTHER): Payer: Medicare HMO | Admitting: Podiatry

## 2015-08-03 VITALS — BP 142/86 | HR 69 | Temp 97.7°F | Resp 12

## 2015-08-03 DIAGNOSIS — L97521 Non-pressure chronic ulcer of other part of left foot limited to breakdown of skin: Secondary | ICD-10-CM

## 2015-08-03 NOTE — Progress Notes (Signed)
   Subjective:    Patient ID: Jacqueline Reese, female    DOB: Oct 08, 1955, 60 y.o.   MRN: [id number]  HPI This patient presents today with approximately two -week history of a sudden onset of blister like formation and breakdown in the left first web space. He said that she has some discomfort in the area and is applying Neosporin to the area on a daily basis. She denies any fever or chills Patient was seen on the visit of 07/27/2015 and at that time cephalexin 500 mg 3 times a day 7 days was prescribed. Patient took 6/7 days of medication, cephalexin 3 times a day and developed a rash on her arms and chest denying any difficulty breathing and discontinue the medication. Patient was also given surgical shoe at that time to wear, however, patient does not have surgical shoe on today  Patient has a known diabetic and denies history of previous skin ulceration, claudication or amputation patient has been seen and ongoing basis for debridement of mycotic toenails   Review of Systems  Cardiovascular: Positive for leg swelling.       Objective:   Physical Exam  Physical Exam  Orientated 3 Objective: Vascular: DP and PT pulses 2/4 bilaterally Capillary reflex immediate bilaterally  Neurological: Sensation to 10 g monofilament wire intact 5/5 bilaterally Vibratory sensation reactive bilaterally Ankle reflex equal and reactive bilaterally  Dermatological:\ The first left web space is macerated and broken down into a superficial ulcer measuring 2.0 cm with a granular base. There is no malodor ,active drainage noted., warmth or erythema there is scaling around the superficial lesion  Musculoskeletal: HAV deformities bilaterally        Assessment & Plan:   Assessment: Diabetic with satisfactory neurovascular status Blister formation with tissue breakdown and superficial ulceration first left web space that has an improved appearance from the initial visit of 07/27/2015 without  any obvious signs of clinical infection Rashing to cephalexin Relative noncompliance of patient and she's not wearing surgical shoe  Plan: DC cephalexin [place] ulcer site and scaling and reapply topical antibiotic ointment Patient advised to continue applying topical antibiotic ointment daily after Betadine soaks Continue wearing surgical shoe on left foot  Reappoint 2 weeks

## 2015-08-03 NOTE — Patient Instructions (Signed)
Do not take any more cephalexin, the antibiotics prescribed on the visit of 07/27/2015 she developed a rash from this medication   Continue daily Betadine soaks with application of triple antibiotic ointment to the first left web space daily Continue wearing the surgical shoe on left foot

## 2015-08-09 ENCOUNTER — Other Ambulatory Visit: Payer: Self-pay | Admitting: Internal Medicine

## 2015-08-09 DIAGNOSIS — E2839 Other primary ovarian failure: Secondary | ICD-10-CM

## 2015-08-17 ENCOUNTER — Telehealth: Payer: Self-pay | Admitting: Podiatry

## 2015-08-17 NOTE — Telephone Encounter (Signed)
Pt cxled appt for next week due to death in family and states her foot is healing good and will keep appt for july

## 2015-08-23 ENCOUNTER — Ambulatory Visit: Payer: Medicare HMO | Admitting: Podiatry

## 2015-08-24 ENCOUNTER — Ambulatory Visit: Payer: Medicare HMO | Admitting: Podiatry

## 2015-08-25 ENCOUNTER — Ambulatory Visit
Admission: RE | Admit: 2015-08-25 | Discharge: 2015-08-25 | Disposition: A | Discharge status: Home / Self Care | Payer: Medicare HMO | Source: Ambulatory Visit | Attending: Internal Medicine | Admitting: Internal Medicine

## 2015-08-25 ENCOUNTER — Other Ambulatory Visit: Payer: Medicare HMO

## 2015-08-25 DIAGNOSIS — E2839 Other primary ovarian failure: Secondary | ICD-10-CM

## 2015-08-30 ENCOUNTER — Other Ambulatory Visit: Payer: Medicare HMO

## 2015-09-13 ENCOUNTER — Inpatient Hospital Stay (HOSPITAL_COMMUNITY): Payer: Medicare HMO | Admitting: Anesthesiology

## 2015-09-13 ENCOUNTER — Encounter (HOSPITAL_COMMUNITY): Payer: Self-pay | Admitting: *Deleted

## 2015-09-13 ENCOUNTER — Ambulatory Visit: Payer: Medicare HMO | Admitting: Podiatry

## 2015-09-13 ENCOUNTER — Encounter (HOSPITAL_COMMUNITY)
Admission: EM | Disposition: A | Discharge status: Home / Self Care | Payer: Self-pay | Source: Home / Self Care | Attending: Pulmonary Disease

## 2015-09-13 ENCOUNTER — Inpatient Hospital Stay (HOSPITAL_COMMUNITY): Payer: Medicare HMO

## 2015-09-13 ENCOUNTER — Inpatient Hospital Stay (HOSPITAL_COMMUNITY)
Admission: EM | Admit: 2015-09-13 | Discharge: 2015-09-18 | DRG: 915 | Disposition: A | Discharge status: Home / Self Care | Payer: Medicare HMO | Attending: Pulmonary Disease | Admitting: Pulmonary Disease

## 2015-09-13 DIAGNOSIS — J9601 Acute respiratory failure with hypoxia: Secondary | ICD-10-CM | POA: Diagnosis not present

## 2015-09-13 DIAGNOSIS — M549 Dorsalgia, unspecified: Secondary | ICD-10-CM | POA: Diagnosis present

## 2015-09-13 DIAGNOSIS — T886XXA Anaphylactic reaction due to adverse effect of correct drug or medicament properly administered, initial encounter: Principal | ICD-10-CM | POA: Diagnosis present

## 2015-09-13 DIAGNOSIS — F209 Schizophrenia, unspecified: Secondary | ICD-10-CM | POA: Diagnosis not present

## 2015-09-13 DIAGNOSIS — F17210 Nicotine dependence, cigarettes, uncomplicated: Secondary | ICD-10-CM | POA: Diagnosis not present

## 2015-09-13 DIAGNOSIS — G8929 Other chronic pain: Secondary | ICD-10-CM | POA: Diagnosis present

## 2015-09-13 DIAGNOSIS — I10 Essential (primary) hypertension: Secondary | ICD-10-CM | POA: Diagnosis not present

## 2015-09-13 DIAGNOSIS — Z7982 Long term (current) use of aspirin: Secondary | ICD-10-CM | POA: Diagnosis not present

## 2015-09-13 DIAGNOSIS — J9600 Acute respiratory failure, unspecified whether with hypoxia or hypercapnia: Secondary | ICD-10-CM

## 2015-09-13 DIAGNOSIS — Z881 Allergy status to other antibiotic agents status: Secondary | ICD-10-CM

## 2015-09-13 DIAGNOSIS — M109 Gout, unspecified: Secondary | ICD-10-CM | POA: Diagnosis not present

## 2015-09-13 DIAGNOSIS — F329 Major depressive disorder, single episode, unspecified: Secondary | ICD-10-CM | POA: Diagnosis not present

## 2015-09-13 DIAGNOSIS — T782XXA Anaphylactic shock, unspecified, initial encounter: Secondary | ICD-10-CM | POA: Diagnosis present

## 2015-09-13 DIAGNOSIS — E1165 Type 2 diabetes mellitus with hyperglycemia: Secondary | ICD-10-CM | POA: Diagnosis not present

## 2015-09-13 DIAGNOSIS — Z79899 Other long term (current) drug therapy: Secondary | ICD-10-CM | POA: Diagnosis not present

## 2015-09-13 DIAGNOSIS — T783XXA Angioneurotic edema, initial encounter: Secondary | ICD-10-CM | POA: Diagnosis not present

## 2015-09-13 DIAGNOSIS — E119 Type 2 diabetes mellitus without complications: Secondary | ICD-10-CM | POA: Insufficient documentation

## 2015-09-13 DIAGNOSIS — Z7984 Long term (current) use of oral hypoglycemic drugs: Secondary | ICD-10-CM | POA: Diagnosis not present

## 2015-09-13 DIAGNOSIS — Y848 Other medical procedures as the cause of abnormal reaction of the patient, or of later complication, without mention of misadventure at the time of the procedure: Secondary | ICD-10-CM | POA: Diagnosis present

## 2015-09-13 DIAGNOSIS — Z888 Allergy status to other drugs, medicaments and biological substances status: Secondary | ICD-10-CM

## 2015-09-13 DIAGNOSIS — T464X5A Adverse effect of angiotensin-converting-enzyme inhibitors, initial encounter: Secondary | ICD-10-CM | POA: Diagnosis present

## 2015-09-13 DIAGNOSIS — Z4659 Encounter for fitting and adjustment of other gastrointestinal appliance and device: Secondary | ICD-10-CM

## 2015-09-13 DIAGNOSIS — J9690 Respiratory failure, unspecified, unspecified whether with hypoxia or hypercapnia: Secondary | ICD-10-CM

## 2015-09-13 HISTORY — PX: NASAL ENDOSCOPY: SHX6577

## 2015-09-13 LAB — GLUCOSE, CAPILLARY
GLUCOSE-CAPILLARY: 161 mg/dL — AB (ref 65–99)
GLUCOSE-CAPILLARY: 131 mg/dL — AB (ref 65–99)
GLUCOSE-CAPILLARY: 96 mg/dL (ref 65–99)
GLUCOSE-CAPILLARY: 93 mg/dL (ref 65–99)

## 2015-09-13 LAB — BASIC METABOLIC PANEL
ANION GAP: 8 (ref 5–15)
Anion gap: 9 (ref 5–15)
BUN: 6 mg/dL (ref 6–20)
BUN: 8 mg/dL (ref 6–20)
CALCIUM: 8.7 mg/dL — AB (ref 8.9–10.3)
CHLORIDE: 104 mmol/L (ref 101–111)
CO2: 24 mmol/L (ref 22–32)
CO2: 23 mmol/L (ref 22–32)
CREATININE: 0.7 mg/dL (ref 0.44–1.00)
Calcium: 9.7 mg/dL (ref 8.9–10.3)
Chloride: 109 mmol/L (ref 101–111)
Creatinine, Ser: 0.57 mg/dL (ref 0.44–1.00)
GFR calc Af Amer: >60 mL/min (ref >60)
GLUCOSE: 101 mg/dL — AB (ref 65–99)
GLUCOSE: 156 mg/dL — AB (ref 65–99)
POTASSIUM: 4.2 mmol/L (ref 3.5–5.1)
Potassium: 4.2 mmol/L (ref 3.5–5.1)
SODIUM: 137 mmol/L (ref 135–145)
Sodium: 140 mmol/L (ref 135–145)

## 2015-09-13 LAB — CBC WITH DIFFERENTIAL/PLATELET
Basophils Absolute: 0 10*3/uL (ref 0.0–0.1)
Basophils Relative: 0 %
Eosinophils Absolute: 0.3 10*3/uL (ref 0.0–0.7)
Eosinophils Relative: 3 %
HEMATOCRIT: 41.9 % (ref 36.0–46.0)
HEMOGLOBIN: 14.3 g/dL (ref 12.0–15.0)
LYMPHS ABS: 2.5 10*3/uL (ref 0.7–4.0)
Lymphocytes Relative: 28 %
MCH: 30.4 pg (ref 26.0–34.0)
MCHC: 34.1 g/dL (ref 30.0–36.0)
MCV: 89 fL (ref 78.0–100.0)
MONOS PCT: 6 %
Monocytes Absolute: 0.5 10*3/uL (ref 0.1–1.0)
NEUTROS ABS: 5.7 10*3/uL (ref 1.7–7.7)
NEUTROS PCT: 63 %
Platelets: 430 10*3/uL — ABNORMAL HIGH (ref 150–400)
RBC: 4.71 MIL/uL (ref 3.87–5.11)
RDW: 14 % (ref 11.5–15.5)
WBC: 8.9 10*3/uL (ref 4.0–10.5)

## 2015-09-13 LAB — MAGNESIUM: Magnesium: 1.6 mg/dL — ABNORMAL LOW (ref 1.7–2.4)

## 2015-09-13 LAB — CBC
HCT: 41.4 % (ref 36.0–46.0)
Hemoglobin: 13.9 g/dL (ref 12.0–15.0)
MCH: 30.2 pg (ref 26.0–34.0)
MCHC: 33.6 g/dL (ref 30.0–36.0)
MCV: 89.8 fL (ref 78.0–100.0)
PLATELETS: 419 10*3/uL — AB (ref 150–400)
RBC: 4.61 MIL/uL (ref 3.87–5.11)
RDW: 14.3 % (ref 11.5–15.5)
WBC: 11.6 10*3/uL — AB (ref 4.0–10.5)

## 2015-09-13 LAB — POCT I-STAT 3, ART BLOOD GAS (G3+)
Acid-base deficit: 4 mmol/L — ABNORMAL HIGH (ref 0.0–2.0)
Bicarbonate: 22.2 mEq/L (ref 20.0–24.0)
O2 SAT: 97 %
PCO2 ART: 42.4 mmHg (ref 35.0–45.0)
PH ART: 7.326 — AB (ref 7.350–7.450)
PO2 ART: 93 mmHg (ref 80.0–100.0)
TCO2: 23 mmol/L (ref 0–100)

## 2015-09-13 LAB — PHOSPHORUS: Phosphorus: 4.4 mg/dL (ref 2.5–4.6)

## 2015-09-13 LAB — MRSA PCR SCREENING: MRSA BY PCR: POSITIVE — AB

## 2015-09-13 LAB — TRIGLYCERIDES: Triglycerides: 141 mg/dL (ref <150)

## 2015-09-13 SURGERY — ENDOSCOPY, NOSE
Anesthesia: Monitor Anesthesia Care | Site: Nose | Laterality: Left

## 2015-09-13 MED ORDER — EPINEPHRINE 0.3 MG/0.3ML IJ SOAJ
0.3000 mg | Freq: Once | INTRAMUSCULAR | Status: AC
  Administered 2015-09-13: 0.3 mg via INTRAMUSCULAR
  Filled 2015-09-13: qty 0.3

## 2015-09-13 MED ORDER — FAMOTIDINE IN NACL 20-0.9 MG/50ML-% IV SOLN: 20.0000 mg | INTRAVENOUS | Status: DC

## 2015-09-13 MED ORDER — C1 ESTERASE INHIBITOR (HUMAN) 500 UNITS IV KIT
1500.0000 [IU] | PACK | Freq: Once | INTRAVENOUS | Status: AC
  Administered 2015-09-13: 1500 [IU] via INTRAVENOUS
  Filled 2015-09-13: qty 1500

## 2015-09-13 MED ORDER — CHLORHEXIDINE GLUCONATE 0.12% ORAL RINSE (MEDLINE KIT)
15.0000 mL | Freq: Two times a day (BID) | OROMUCOSAL | Status: DC
  Administered 2015-09-13: 15 mL via OROMUCOSAL

## 2015-09-13 MED ORDER — SODIUM CHLORIDE 0.9 % IV SOLN
25.0000 ug/h | INTRAVENOUS | Status: DC
  Administered 2015-09-13: 200 ug/h via INTRAVENOUS
  Administered 2015-09-13: 100 ug/h via INTRAVENOUS
  Administered 2015-09-14 (×3): 300 ug/h via INTRAVENOUS
  Administered 2015-09-15: 100 ug/h via INTRAVENOUS
  Filled 2015-09-13 (×6): qty 50

## 2015-09-13 MED ORDER — KETAMINE HCL-SODIUM CHLORIDE 100-0.9 MG/10ML-% IV SOSY: 170.0000 mg | PREFILLED_SYRINGE | Freq: Once | INTRAVENOUS | Status: DC

## 2015-09-13 MED ORDER — COCAINE HCL 4 % EX SOLN
CUTANEOUS | Status: DC | PRN
  Administered 2015-09-13: 4 mL via NASAL

## 2015-09-13 MED ORDER — KETAMINE HCL 100 MG/ML IJ SOLN
INTRAMUSCULAR | Status: DC | PRN
  Administered 2015-09-13: 30 mg via INTRAVENOUS
  Administered 2015-09-13: 20 mg via INTRAVENOUS

## 2015-09-13 MED ORDER — INSULIN ASPART 100 UNIT/ML ~~LOC~~ SOLN
0.0000 [IU] | SUBCUTANEOUS | Status: DC
  Administered 2015-09-13: 3 [IU] via SUBCUTANEOUS
  Administered 2015-09-13 – 2015-09-15 (×5): 2 [IU] via SUBCUTANEOUS

## 2015-09-13 MED ORDER — DIPHENHYDRAMINE HCL 50 MG/ML IJ SOLN
50.0000 mg | Freq: Once | INTRAMUSCULAR | Status: AC
  Administered 2015-09-13: 50 mg via INTRAVENOUS
  Filled 2015-09-13: qty 1

## 2015-09-13 MED ORDER — FAMOTIDINE IN NACL 20-0.9 MG/50ML-% IV SOLN
20.0000 mg | INTRAVENOUS | Status: DC
  Administered 2015-09-13 – 2015-09-15 (×3): 20 mg via INTRAVENOUS
  Filled 2015-09-13 (×4): qty 50

## 2015-09-13 MED ORDER — METHYLPREDNISOLONE SODIUM SUCC 125 MG IJ SOLR: 60.0000 mg | Freq: Four times a day (QID) | INTRAMUSCULAR | Status: DC

## 2015-09-13 MED ORDER — COCAINE HCL 4 % EX SOLN
CUTANEOUS | Status: AC
  Filled 2015-09-13: qty 4

## 2015-09-13 MED ORDER — METHYLPREDNISOLONE SODIUM SUCC 125 MG IJ SOLR
60.0000 mg | Freq: Two times a day (BID) | INTRAMUSCULAR | Status: DC
  Administered 2015-09-13 – 2015-09-16 (×6): 60 mg via INTRAVENOUS
  Filled 2015-09-13 (×8): qty 2

## 2015-09-13 MED ORDER — SODIUM CHLORIDE 0.9 % IV SOLN
INTRAVENOUS | Status: DC | PRN
  Administered 2015-09-13 (×2): via INTRAVENOUS

## 2015-09-13 MED ORDER — FENTANYL CITRATE (PF) 100 MCG/2ML IJ SOLN: 100.0000 ug | INTRAMUSCULAR | Status: DC | PRN

## 2015-09-13 MED ORDER — ANTISEPTIC ORAL RINSE SOLUTION (CORINZ): 7.0000 mL | Freq: Four times a day (QID) | OROMUCOSAL | Status: DC

## 2015-09-13 MED ORDER — PROPOFOL 10 MG/ML IV BOLUS
INTRAVENOUS | Status: DC | PRN
  Administered 2015-09-13: 50 mg via INTRAVENOUS
  Administered 2015-09-13: 100 mg via INTRAVENOUS

## 2015-09-13 MED ORDER — MIDAZOLAM HCL 2 MG/2ML IJ SOLN
INTRAMUSCULAR | Status: AC
  Filled 2015-09-13: qty 2

## 2015-09-13 MED ORDER — PROPOFOL 500 MG/50ML IV EMUL
INTRAVENOUS | Status: DC | PRN
  Administered 2015-09-13: 50 ug/kg/min via INTRAVENOUS

## 2015-09-13 MED ORDER — SODIUM CHLORIDE 0.9 % IV SOLN
Freq: Once | INTRAVENOUS | Status: AC
  Administered 2015-09-13: 02:00:00 via INTRAVENOUS

## 2015-09-13 MED ORDER — SODIUM CHLORIDE 0.9 % IV SOLN: 250.0000 mL | INTRAVENOUS | Status: DC | PRN

## 2015-09-13 MED ORDER — FAMOTIDINE IN NACL 20-0.9 MG/50ML-% IV SOLN
20.0000 mg | Freq: Once | INTRAVENOUS | Status: AC
  Administered 2015-09-13: 20 mg via INTRAVENOUS
  Filled 2015-09-13: qty 50

## 2015-09-13 MED ORDER — METHYLPREDNISOLONE SODIUM SUCC 125 MG IJ SOLR
125.0000 mg | Freq: Once | INTRAMUSCULAR | Status: AC
  Administered 2015-09-13: 125 mg via INTRAVENOUS
  Filled 2015-09-13: qty 2

## 2015-09-13 MED ORDER — FENTANYL CITRATE (PF) 100 MCG/2ML IJ SOLN
100.0000 ug | INTRAMUSCULAR | Status: DC | PRN
  Administered 2015-09-13 (×2): 100 ug via INTRAVENOUS
  Filled 2015-09-13 (×2): qty 2

## 2015-09-13 MED ORDER — DIPHENHYDRAMINE HCL 50 MG/ML IJ SOLN: 25.0000 mg | Freq: Four times a day (QID) | INTRAMUSCULAR | Status: DC

## 2015-09-13 MED ORDER — ALBUTEROL SULFATE (2.5 MG/3ML) 0.083% IN NEBU: 2.5000 mg | INHALATION_SOLUTION | RESPIRATORY_TRACT | Status: DC | PRN

## 2015-09-13 MED ORDER — KETAMINE HCL 100 MG/ML IJ SOLN
INTRAMUSCULAR | Status: AC
  Filled 2015-09-13: qty 1

## 2015-09-13 MED ORDER — PROPOFOL 1000 MG/100ML IV EMUL
0.0000 ug/kg/min | INTRAVENOUS | Status: DC
  Administered 2015-09-13: 35 ug/kg/min via INTRAVENOUS
  Administered 2015-09-13: 25 ug/kg/min via INTRAVENOUS
  Administered 2015-09-13: 40 ug/kg/min via INTRAVENOUS
  Administered 2015-09-14 – 2015-09-15 (×10): 50 ug/kg/min via INTRAVENOUS
  Filled 2015-09-13 (×14): qty 100

## 2015-09-13 MED ORDER — CHLORHEXIDINE GLUCONATE 0.12% ORAL RINSE (MEDLINE KIT)
15.0000 mL | Freq: Two times a day (BID) | OROMUCOSAL | Status: DC
  Administered 2015-09-13 – 2015-09-15 (×4): 15 mL via OROMUCOSAL

## 2015-09-13 MED ORDER — HEPARIN SODIUM (PORCINE) 5000 UNIT/ML IJ SOLN
5000.0000 [IU] | Freq: Three times a day (TID) | INTRAMUSCULAR | Status: DC
  Administered 2015-09-13 – 2015-09-18 (×15): 5000 [IU] via SUBCUTANEOUS
  Filled 2015-09-13 (×15): qty 1

## 2015-09-13 MED ORDER — SODIUM CHLORIDE 0.9 % IV SOLN
INTRAVENOUS | Status: DC
  Administered 2015-09-13 (×2): via INTRAVENOUS

## 2015-09-13 MED ORDER — ANTISEPTIC ORAL RINSE SOLUTION (CORINZ)
7.0000 mL | Freq: Four times a day (QID) | OROMUCOSAL | Status: DC
  Administered 2015-09-13 – 2015-09-15 (×8): 7 mL via OROMUCOSAL

## 2015-09-13 MED ORDER — MIDAZOLAM HCL 2 MG/2ML IJ SOLN
INTRAMUSCULAR | Status: DC | PRN
  Administered 2015-09-13 (×2): 1 mg via INTRAVENOUS

## 2015-09-13 MED ORDER — FENTANYL BOLUS VIA INFUSION
25.0000 ug | INTRAVENOUS | Status: DC | PRN
  Filled 2015-09-13: qty 100

## 2015-09-13 MED ORDER — FENTANYL CITRATE (PF) 250 MCG/5ML IJ SOLN
INTRAMUSCULAR | Status: AC
  Filled 2015-09-13: qty 5

## 2015-09-13 MED ORDER — SODIUM CHLORIDE 0.9 % IV SOLN: INTRAVENOUS | Status: DC

## 2015-09-13 MED ORDER — ROCURONIUM BROMIDE 100 MG/10ML IV SOLN
INTRAVENOUS | Status: DC | PRN
  Administered 2015-09-13: 50 mg via INTRAVENOUS

## 2015-09-13 MED ORDER — DIPHENHYDRAMINE HCL 50 MG/ML IJ SOLN
25.0000 mg | Freq: Four times a day (QID) | INTRAMUSCULAR | Status: DC
  Administered 2015-09-13 – 2015-09-16 (×15): 25 mg via INTRAVENOUS
  Filled 2015-09-13 (×15): qty 1

## 2015-09-13 SURGICAL SUPPLY — 37 items
BLADE SURG 15 STRL LF DISP TIS (BLADE) ×1 IMPLANT
BLADE SURG 15 STRL SS (BLADE) ×4
BLADE SURG ROTATE 9660 (MISCELLANEOUS) IMPLANT
CANISTER SUCTION 2500CC (MISCELLANEOUS) ×1 IMPLANT
CLEANER TIP ELECTROSURG 2X2 (MISCELLANEOUS) ×1 IMPLANT
COVER SURGICAL LIGHT HANDLE (MISCELLANEOUS) ×4 IMPLANT
DRAPE PROXIMA HALF (DRAPES) IMPLANT
ELECT COATED BLADE 2.86 ST (ELECTRODE) ×4 IMPLANT
ELECT REM PT RETURN 9FT ADLT (ELECTROSURGICAL) ×4
ELECTRODE REM PT RTRN 9FT ADLT (ELECTROSURGICAL) ×2 IMPLANT
GAUZE SPONGE 4X4 16PLY XRAY LF (GAUZE/BANDAGES/DRESSINGS) ×4 IMPLANT
GAUZE XEROFORM 5X9 LF (GAUZE/BANDAGES/DRESSINGS) IMPLANT
GLOVE BIOGEL M 7.0 STRL (GLOVE) ×8 IMPLANT
GOWN STRL REUS W/ TWL LRG LVL3 (GOWN DISPOSABLE) ×1 IMPLANT
GOWN STRL REUS W/TWL LRG LVL3 (GOWN DISPOSABLE)
HOLDER TRACH TUBE VELCRO 19.5 (MISCELLANEOUS) IMPLANT
KIT BASIN OR (CUSTOM PROCEDURE TRAY) ×4 IMPLANT
KIT ROOM TURNOVER OR (KITS) ×4 IMPLANT
KIT SUCTION CATH 14FR (SUCTIONS) IMPLANT
NDL HYPO 25GX1X1/2 BEV (NEEDLE) ×1 IMPLANT
NEEDLE HYPO 25GX1X1/2 BEV (NEEDLE) ×4 IMPLANT
NS IRRIG 1000ML POUR BTL (IV SOLUTION) ×1 IMPLANT
PACK EENT II TURBAN DRAPE (CUSTOM PROCEDURE TRAY) ×4 IMPLANT
PAD ARMBOARD 7.5X6 YLW CONV (MISCELLANEOUS) ×8 IMPLANT
PENCIL BUTTON HOLSTER BLD 10FT (ELECTRODE) ×4 IMPLANT
SPONGE DRAIN TRACH 4X4 STRL 2S (GAUZE/BANDAGES/DRESSINGS) ×1 IMPLANT
SPONGE INTESTINAL PEANUT (DISPOSABLE) ×4 IMPLANT
SUT CHROMIC 2 0 SH (SUTURE) ×1 IMPLANT
SUT ETHILON 2 0 FS 18 (SUTURE) ×2 IMPLANT
SUT SILK 2 0 FS (SUTURE) ×1 IMPLANT
SUT SILK 3 0 REEL (SUTURE) ×1 IMPLANT
SYR 20CC LL (SYRINGE) ×1 IMPLANT
SYR BULB IRRIGATION 50ML (SYRINGE) IMPLANT
SYR CONTROL 10ML LL (SYRINGE) ×4 IMPLANT
TUBE CONNECTING 12'X1/4 (SUCTIONS) ×1
TUBE CONNECTING 12X1/4 (SUCTIONS) ×3 IMPLANT
WATER STERILE IRR 1000ML POUR (IV SOLUTION) ×1 IMPLANT

## 2015-09-13 NOTE — H&P (Signed)
PULMONARY / CRITICAL CARE MEDICINE   Name: Jacqueline Reese MRN: [id number] DOB: 1955/11/02    ADMISSION DATE:  09/13/2015 CONSULTATION DATE:  09/13/15  REFERRING MD:  Claudine Mouton (EDP)  CHIEF COMPLAINT:  Tongue swelling  HISTORY OF PRESENT ILLNESS:  Pt is difficult to understand due to significant tongue edema; therefore, this HPI is obtained from chart review. Jacqueline Reese is a 60 y.o. female with PMH as outlined below including HTN for which she is on lisinopril.  She presented to [facility] ED 09/13/15 with acute onset tongue swelling that began roughly 2330 the night prior.  Swelling has progressed since onset to the point where she can now not control her secretions / saliva.  She apparently tried some new essential oils the night prior for leg pain but otherwise, no new exposures.  She was seen by anesthesia in ED who felt that pt could be admitted to ICU with close monitoring.  Upon our arrival to bedside, pt had increased tongue edema to the point where her entire mouth was occluded, it was difficult to understand her, and she could not control secretions.  We feel that pt needs immediate nasotracheal intubation vs tracheostomy.  We have called Dr. Wilburn Cornelia with ENT who recommended to call anesthesia who will try nasotracheal intubation as first option and he will be on standby for trach if needed.  PAST MEDICAL HISTORY :  She  has a past medical history of Diabetes mellitus without complication ([place]); Schizophrenic disorder ([place]); Hypertension; Chronic back pain; Gout; and Depression.  PAST SURGICAL HISTORY: She  has past surgical history that includes Cesarean section.  Allergies  Allergen Reactions  . Lisinopril Swelling    Oral swelling  . Chantix [Varenicline]   . Doxycycline Other (See Comments)    Interactions with other medications  . Ibuprofen Nausea And Vomiting  . Lamisil [Terbinafine Hcl] Hives  . Prednisone Other (See Comments)    nervous  . Cephalexin Rash  .  Sulfa Antibiotics Anxiety    No current facility-administered medications on file prior to encounter.   Current Outpatient Prescriptions on File Prior to Encounter  Medication Sig  . albuterol (ACCUNEB) 1.25 MG/3ML nebulizer solution Take 3 mLs (1.25 mg total) by nebulization every 6 (six) hours as needed for wheezing. (Patient not taking: Reported on 06/15/2015)  . albuterol (PROVENTIL HFA;VENTOLIN HFA) 108 (90 BASE) MCG/ACT inhaler Inhale 2 puffs into the lungs every 4 (four) hours as needed for wheezing or shortness of breath. (Patient not taking: Reported on 06/15/2015)  . albuterol (PROVENTIL HFA;VENTOLIN HFA) 108 (90 BASE) MCG/ACT inhaler Inhale 2 puffs into the lungs every 4 (four) hours as needed for wheezing or shortness of breath. (Patient not taking: Reported on 06/15/2015)  . allopurinol (ZYLOPRIM) 100 MG tablet Take 200 mg by mouth daily.  Marland Kitchen aspirin EC 325 MG tablet Take 325 mg by mouth daily.  Marland Kitchen azithromycin (ZITHROMAX Z-PAK) 250 MG tablet Take 2 tablets today, then 1 tablet daily until gone. (Patient not taking: Reported on 06/15/2015)  . beclomethasone (QVAR) 80 MCG/ACT inhaler Inhale 1 puff into the lungs 2 (two) times daily. (Patient not taking: Reported on 06/15/2015)  . buPROPion (WELLBUTRIN XL) 150 MG 24 hr tablet Take 150 mg by mouth daily.  . cephALEXin (KEFLEX) 500 MG capsule Take 1 capsule (500 mg total) by mouth 3 (three) times daily.  . clonazePAM (KLONOPIN) 1 MG tablet Take 1 mg by mouth 4 (four) times daily.   . cyclobenzaprine (FLEXERIL) 10 MG tablet Take 10  mg by mouth 3 (three) times daily as needed for muscle spasms.  Marland Kitchen econazole nitrate 1 % cream Apply topically daily. (Patient not taking: Reported on 06/15/2015)  . escitalopram (LEXAPRO) 20 MG tablet Take 20 mg by mouth daily.   . Fluticasone-Salmeterol (ADVAIR DISKUS) 250-50 MCG/DOSE AEPB Inhale 1 puff into the lungs every 12 (twelve) hours. (Patient not taking: Reported on 06/15/2015)  . gabapentin (NEURONTIN) 100  MG capsule Take 100 mg by mouth 2 (two) times daily.  Marland Kitchen gabapentin (NEURONTIN) 400 MG capsule Take 1 capsule (400 mg total) by mouth 3 (three) times daily.  Marland Kitchen glipiZIDE (GLUCOTROL XL) 2.5 MG 24 hr tablet Take 2.5 mg by mouth daily.  Marland Kitchen guaifenesin (ROBITUSSIN) 100 MG/5ML syrup Take 5-10 mLs (100-200 mg total) by mouth every 4 (four) hours as needed for cough. (Patient not taking: Reported on 06/15/2015)  . HYDROcodone-acetaminophen (NORCO/VICODIN) 5-325 MG tablet Take 1-2 tablets by mouth every 4 (four) hours as needed for moderate pain. (Patient not taking: Reported on 06/15/2015)  . HYDROcodone-acetaminophen (NORCO/VICODIN) 5-325 MG tablet Take 2 tablets by mouth every 4 (four) hours as needed.  Marland Kitchen lisinopril (PRINIVIL,ZESTRIL) 10 MG tablet Take 10 mg by mouth daily.   . metFORMIN (GLUCOPHAGE) 500 MG tablet Take 500 mg by mouth 2 (two) times daily with a meal.  . mirtazapine (REMERON) 15 MG tablet Take 15 mg by mouth daily.   . naproxen (NAPROSYN) 500 MG tablet Take 1 tablet (500 mg total) by mouth 2 (two) times daily.  Marland Kitchen omeprazole (PRILOSEC) 20 MG capsule Take 20 mg by mouth daily as needed. For heartburn  . spironolactone (ALDACTONE) 50 MG tablet Take 50 mg by mouth daily.  Marland Kitchen terbinafine (LAMISIL) 250 MG tablet Take 1 tablet (250 mg total) by mouth daily. (Patient not taking: Reported on 06/15/2015)  . ziprasidone (GEODON) 80 MG capsule Take 80 mg by mouth 3 (three) times daily. (per mental health)    FAMILY HISTORY:  Her has no family status information on file.   SOCIAL HISTORY: She  reports that she has been smoking Cigarettes.  She has been smoking about 0.33 packs per day. She has never used smokeless tobacco. She reports that she does not drink alcohol or use illicit drugs.  REVIEW OF SYSTEMS:   Unable to obtain as pt is very difficult to understand due to tongue edema.  SUBJECTIVE:  Unable to control secretions, oral cavity completely occluded.  VITAL SIGNS: BP 136/67 mmHg  Pulse  76  Temp(Src) 98.6 F (37 C) (Oral)  Resp 21  SpO2 95%  LMP 05/09/2010  HEMODYNAMICS:    VENTILATOR SETTINGS:    INTAKE / OUTPUT:     PHYSICAL EXAMINATION: General: Middle aged female, currently in NAD. Neuro: A&O x 3, non-focal.  HEENT: Significant tongue edema that fully occludes oral cavity.  PERRL, sclerae anicteric. Cardiovascular: RRR, no M/R/G.  Lungs: Respirations even and unlabored.  CTA bilaterally, No W/R/R. Abdomen: BS x 4, soft, NT/ND.  Musculoskeletal: No gross deformities, no edema.  Skin: Intact, warm, no rashes.  LABS:  BMET  Recent Labs Lab 09/13/15 0130  NA 137  K 4.2  CL 104  CO2 24  BUN 6  CREATININE 0.70  GLUCOSE 101*    Electrolytes  Recent Labs Lab 09/13/15 0130  CALCIUM 9.7    CBC  Recent Labs Lab 09/13/15 0130  WBC 8.9  HGB 14.3  HCT 41.9  PLT 430*    Coag's No results for input(s): APTT, INR in the last 168  hours.  Sepsis Markers No results for input(s): LATICACIDVEN, PROCALCITON, O2SATVEN in the last 168 hours.  ABG No results for input(s): PHART, PCO2ART, PO2ART in the last 168 hours.  Liver Enzymes No results for input(s): AST, ALT, ALKPHOS, BILITOT, ALBUMIN in the last 168 hours.  Cardiac Enzymes No results for input(s): TROPONINI, PROBNP in the last 168 hours.  Glucose No results for input(s): GLUCAP in the last 168 hours.  Imaging No results found.   STUDIES:  None.  CULTURES: None.  ANTIBIOTICS: None.  SIGNIFICANT EVENTS: 07/11 > admitted with ACE-i induced angioedema.  LINES/TUBES: ETT vs trach pending 7/11 >  DISCUSSION: 60 y.o. F admitted 7/11 with ACE-i induced angioedema.  ASSESSMENT / PLAN:  PULMONARY A: Acute respiratory failure due to inability to protect airway in the setting of ACE inhibitor induced angioedema. P:   Anesthesia to evaluate for nasotracheal intubation, if unable then ENT to likely perform tracheostomy. Albuterol PRN. Solumedrol, Pepcid, Benadryl  scheduled.  CARDIOVASCULAR A:  Hx HTN - on lisinopril. P:  D/C LISINOPRIL AND AVOID LIFELONG (Added to allergy list). Hold outpatient aspirin, lisinopril, spironolactone.  RENAL A:   No acute issues. P:   NS @ 75. BMP in AM.  GASTROINTESTINAL A:   Nutrition. P:   NPO.  HEMATOLOGIC A:   VTE Prophylaxis. P:  SCD's / Heparin. CBC in AM.  INFECTIOUS A:   No indication of infection. P:   Monitor clinically.  ENDOCRINE A:   DM. P:   SSI. Hold outpatient metformin, glipizide.  NEUROLOGIC A:   Hx schizophrenia, depression. P:   Hold outpatient bupropion, clonazepam, escitalopram, gabapentin, norco, mirtazapine, ziprasidone.  Family updated: None available.  Interdisciplinary Family Meeting v Palliative Care Meeting:  Due by: 09/20/15.  CC time: 40 minutes.   Montey Hora, [facility] Pager: [phone]  or [phone] 09/13/2015, 3:06 AM

## 2015-09-13 NOTE — ED Provider Notes (Addendum)
CSN: [id number]     Arrival date & time 09/13/15  0104 History  By signing my name below, I, Altamease Oiler, attest that this documentation has been prepared under the direction and in the presence of Everlene Balls, MD. Electronically Signed: Altamease Oiler, ED Scribe. 09/13/2015. 2:34 AM   Chief Complaint  Patient presents with  . Oral Swelling   The history is provided by the patient. No language interpreter was used.   Jacqueline Reese is a 60 y.o. female with PMHx of HTN on lisinopril and DM who presents to the Emergency Department complaining of new tongue swelling with onset approximately 2 hours ago upon waking. She is not able to swallow her saliva. Pt states that she tried a new essential oil last night for leg pain but has had no other new exposures. Pt denies SOB or difficulty breathing.    Past Medical History  Diagnosis Date  . Diabetes mellitus without complication ([place])   . Schizophrenic disorder ([place])   . Hypertension   . Chronic back pain   . Gout   . Depression    Past Surgical History  Procedure Laterality Date  . Cesarean section      x2   History reviewed. No pertinent family history. Social History  Substance Use Topics  . Smoking status: Current Every Day Smoker -- 0.33 packs/day    Types: Cigarettes  . Smokeless tobacco: Never Used  . Alcohol Use: No   OB History    No data available     Review of Systems  10 Systems reviewed and all are negative for acute change except as noted in the HPI.  Allergies  Chantix; Doxycycline; Ibuprofen; Lamisil; Prednisone; Cephalexin; and Sulfa antibiotics  Home Medications   Prior to Admission medications   Medication Sig Start Date End Date Taking? Authorizing Provider  albuterol (ACCUNEB) 1.25 MG/3ML nebulizer solution Take 3 mLs (1.25 mg total) by nebulization every 6 (six) hours as needed for wheezing. Patient not taking: Reported on 06/15/2015 10/02/13   Melony Overly, MD  albuterol (PROVENTIL  HFA;VENTOLIN HFA) 108 (90 BASE) MCG/ACT inhaler Inhale 2 puffs into the lungs every 4 (four) hours as needed for wheezing or shortness of breath. Patient not taking: Reported on 06/15/2015 05/09/13   Janne Napoleon, NP  albuterol (PROVENTIL HFA;VENTOLIN HFA) 108 (90 BASE) MCG/ACT inhaler Inhale 2 puffs into the lungs every 4 (four) hours as needed for wheezing or shortness of breath. Patient not taking: Reported on 06/15/2015 09/27/13   Janne Napoleon, NP  allopurinol (ZYLOPRIM) 100 MG tablet Take 200 mg by mouth daily.    Historical Provider, MD  aspirin EC 325 MG tablet Take 325 mg by mouth daily.    Historical Provider, MD  azithromycin (ZITHROMAX Z-PAK) 250 MG tablet Take 2 tablets today, then 1 tablet daily until gone. Patient not taking: Reported on 06/15/2015 10/02/13   Melony Overly, MD  beclomethasone (QVAR) 80 MCG/ACT inhaler Inhale 1 puff into the lungs 2 (two) times daily. Patient not taking: Reported on 06/15/2015 09/27/13   Janne Napoleon, NP  buPROPion (WELLBUTRIN XL) 150 MG 24 hr tablet Take 150 mg by mouth daily.    Historical Provider, MD  cephALEXin (KEFLEX) 500 MG capsule Take 1 capsule (500 mg total) by mouth 3 (three) times daily. 07/27/15   Richard Joelene Millin, DPM  clonazePAM (KLONOPIN) 1 MG tablet Take 1 mg by mouth 4 (four) times daily.     Historical Provider, MD  cyclobenzaprine (FLEXERIL) 10 MG tablet  Take 10 mg by mouth 3 (three) times daily as needed for muscle spasms.    Historical Provider, MD  econazole nitrate 1 % cream Apply topically daily. Patient not taking: Reported on 06/15/2015 12/15/12   Gean Birchwood, DPM  escitalopram (LEXAPRO) 20 MG tablet Take 20 mg by mouth daily.     Historical Provider, MD  Fluticasone-Salmeterol (ADVAIR DISKUS) 250-50 MCG/DOSE AEPB Inhale 1 puff into the lungs every 12 (twelve) hours. Patient not taking: Reported on 06/15/2015 05/09/13   Janne Napoleon, NP  gabapentin (NEURONTIN) 100 MG capsule Take 100 mg by mouth 2 (two) times daily.    Historical Provider,  MD  gabapentin (NEURONTIN) 400 MG capsule Take 1 capsule (400 mg total) by mouth 3 (three) times daily. 06/21/15   Konrad Felix, PA  glipiZIDE (GLUCOTROL XL) 2.5 MG 24 hr tablet Take 2.5 mg by mouth daily.    Historical Provider, MD  guaifenesin (ROBITUSSIN) 100 MG/5ML syrup Take 5-10 mLs (100-200 mg total) by mouth every 4 (four) hours as needed for cough. Patient not taking: Reported on 06/15/2015 03/02/12   Davonna Belling, MD  HYDROcodone-acetaminophen (NORCO/VICODIN) 5-325 MG tablet Take 1-2 tablets by mouth every 4 (four) hours as needed for moderate pain. Patient not taking: Reported on 06/15/2015 06/03/15   Orpah Greek, MD  HYDROcodone-acetaminophen (NORCO/VICODIN) 5-325 MG tablet Take 2 tablets by mouth every 4 (four) hours as needed. 06/21/15   Konrad Felix, PA  lisinopril (PRINIVIL,ZESTRIL) 10 MG tablet Take 10 mg by mouth daily.  04/05/13   Historical Provider, MD  metFORMIN (GLUCOPHAGE) 500 MG tablet Take 500 mg by mouth 2 (two) times daily with a meal.    Historical Provider, MD  mirtazapine (REMERON) 15 MG tablet Take 15 mg by mouth daily.  02/03/13   Historical Provider, MD  naproxen (NAPROSYN) 500 MG tablet Take 1 tablet (500 mg total) by mouth 2 (two) times daily. 06/15/15   Ezequiel Essex, MD  omeprazole (PRILOSEC) 20 MG capsule Take 20 mg by mouth daily as needed. For heartburn    Historical Provider, MD  spironolactone (ALDACTONE) 50 MG tablet Take 50 mg by mouth daily.    Historical Provider, MD  terbinafine (LAMISIL) 250 MG tablet Take 1 tablet (250 mg total) by mouth daily. Patient not taking: Reported on 06/15/2015 04/10/13   Bronson Ing, DPM  ziprasidone (GEODON) 80 MG capsule Take 80 mg by mouth 3 (three) times daily. (per mental health)    Historical Provider, MD   BP 103/66 mmHg  Pulse 62  Temp(Src) 98.6 F (37 C) (Oral)  Resp 15  SpO2 96%  LMP 05/09/2010 Physical Exam  Constitutional: She is oriented to person, place, and time. She appears  well-developed and well-nourished. No distress.  HENT:  Head: Normocephalic and atraumatic.  Nose: Nose normal.  Severely swollen tongue Cannot visualize posterior oropharynx Unable to swallow oral secretions   Eyes: Conjunctivae and EOM are normal. Pupils are equal, round, and reactive to light. No scleral icterus.  Neck: Normal range of motion. Neck supple. No JVD present. No tracheal deviation present. No thyromegaly present.  Cardiovascular: Normal rate, regular rhythm and normal heart sounds.  Exam reveals no gallop and no friction rub.   No murmur heard. Pulmonary/Chest: Effort normal and breath sounds normal. No respiratory distress. She has no wheezes. She exhibits no tenderness.  Abdominal: Soft. Bowel sounds are normal. She exhibits no distension and no mass. There is no tenderness. There is no rebound and no guarding.  Musculoskeletal: Normal range of motion. She exhibits no edema or tenderness.  Lymphadenopathy:    She has no cervical adenopathy.  Neurological: She is alert and oriented to person, place, and time. No cranial nerve deficit. She exhibits normal muscle tone.  Skin: Skin is warm and dry. No rash noted. No erythema. No pallor.  Nursing note and vitals reviewed.   ED Course  Procedures (including critical care time)  CRITICAL CARE Performed by: Everlene Balls, MD Total critical care time: 50 minutes Critical care time was exclusive of separately billable procedures and treating other patients. Critical care was necessary to treat or prevent imminent or life-threatening deterioration. Critical care was time spent personally by me on the following activities: development of treatment plan with patient and/or surrogate as well as nursing, discussions with consultants, evaluation of patient's response to treatment, examination of patient, obtaining history from patient or surrogate, ordering and performing treatments and interventions, ordering and review of laboratory  studies, ordering and review of radiographic studies, pulse oximetry and re-evaluation of patient's condition.  DIAGNOSTIC STUDIES: Oxygen Saturation is 96% on 2L, adequate by my interpretation.    COORDINATION OF CARE: 1:24 AM Discussed treatment plan which includes lab work, epinephrine, Pepcid, Solu-medrol, Benadryl with pt at bedside and pt agreed to plan.  2:29 AM-Consult complete with Dr. Blaine Hamper (Hospitalist). Patient case explained and discussed.   Labs Review Labs Reviewed  CBC WITH DIFFERENTIAL/PLATELET - Abnormal; Notable for the following:    Platelets 430 (*)    All other components within normal limits  BASIC METABOLIC PANEL - Abnormal; Notable for the following:    Glucose, Bld 101 (*)    All other components within normal limits    Imaging Review No results found. I have personally reviewed and evaluated these lab results as part of my medical decision-making.   EKG Interpretation None      MDM   Final diagnoses:  None    Patient presents to the Ed for anaphylaxis due to lisinopril.  She was given epi, solumedrol, famotidine, and benadryl.  1hr later, patient has gotten worse.  Her tongue is not more swollen than my initial exam, she can no longer close her mouth, and she states it is starting to hurt.  She still states her breathing is normal.  I paged Dr. Tobias Alexander with anesthesiology for help with intubation.  He has evaluated the patient, spoke with Dr. Wilburn Cornelia with ENT, and we have planned to have the patient admitted to the ICU with instructions to call for any worsening. Dr. Wilburn Cornelia also recommended given C1 esterase inhibitor which was ordered.  I spoke with ICU who agrees for admission.   I personally performed the services described in this documentation, which was scribed in my presence. The recorded information has been reviewed and is accurate.      Everlene Balls, MD 09/13/15 0244   3:48 AM Patient continues to get worse in the ED.  Tongue  swelling continues and she now has traces of blood when she wipes her saliva, possible from her tongue swelling into her teeth.  She appears to be breathing comfortably in and in distress otherwise.  ICU team notified and they have spoken with anesthesia as well.  Patient remains in critical condition and will be admitted for further care.  Everlene Balls, MD 09/13/15 [phone]

## 2015-09-13 NOTE — Progress Notes (Signed)
Called to ER to evaluate Pt for possible intubation.  Pt admitted to ER with tongue swelling due to Lisinopril. She woke up around 11pm with tongue swelling.  She has been admitted and treated for over 1 hour, but her swelling has worsened.  Presently she does not have any respiratory distress and her angioedema is basically in the anterior region.  I called Dr. Wilburn Cornelia and discussed her condition.  I would have ENT present to help with intubation.  He felt she should be transferred to the ICU and we will have an OR ready if she develops any respiratory difficulty.  At this point I feel an awake FO intubation would be our best plan of action.  I discussed the plan with the patient and ER physician and they agree.

## 2015-09-13 NOTE — ED Notes (Addendum)
Tongue appears more swollen than initial assessment, pt denies shortness of breath at present, sats 94%. Dr.Oni made aware and at bedside

## 2015-09-13 NOTE — ED Notes (Signed)
Patient now having pain in her tongue where her teeth meet the tongue.  Patient states that she feels like the tongue is continuing to swell.  Dr Claudine Mouton notified.

## 2015-09-13 NOTE — ED Notes (Signed)
Dr Wilburn Cornelia at bedside.

## 2015-09-13 NOTE — Brief Op Note (Signed)
09/13/2015  4:37 AM  PATIENT:  Jacqueline Reese  60 y.o. female  PRE-OPERATIVE DIAGNOSIS:  obstruction airway  POST-OPERATIVE DIAGNOSIS:  obstruction airway  PROCEDURE:  Procedure(s): NASAL ENDOSCOPY WITH NASAL INTABATION (Left)  SURGEON:  Surgeon(s) and Role:    * Jerrell Belfast, MD - Primary  PHYSICIAN ASSISTANT:   ASSISTANTS: Dr. Tamela Gammon   ANESTHESIA:   IV sedation  EBL:   none  BLOOD ADMINISTERED:none  DRAINS: none   LOCAL MEDICATIONS USED:  NONE  SPECIMEN:  No Specimen  DISPOSITION OF SPECIMEN:  N/A  COUNTS:  YES  TOURNIQUET:  * No tourniquets in log *  DICTATION: .Other Dictation: Dictation Number [id number]  PLAN OF CARE: Admit to inpatient   PATIENT DISPOSITION:  PACU - hemodynamically stable.   Delay start of Pharmacological VTE agent (>24hrs) due to surgical blood loss or risk of bleeding: not applicable

## 2015-09-13 NOTE — ED Notes (Signed)
Anesthesia notified.  En route to ED.

## 2015-09-13 NOTE — Consult Note (Signed)
ENT CONSULT:  Reason for Consult: Acute tongue swelling Referring Physician: Critical care medicine  Jacqueline Reese is an 60 y.o. female.  HPI: Patient presents to the [facility] emergency department with acute anterior tongue swelling. The patient has a history of hypertension and takes lisinopril and ACE inhibitor. No prior allergic reaction or concern. The patient was treated with appropriate medical therapy in the emergency department but continued to have progression in her tongue swelling, airway was stable there was no stridor but concerns raised regarding progression in her swelling and possible loss of state and patent airway.  Past Medical History  Diagnosis Date  . Diabetes mellitus without complication ([place])   . Schizophrenic disorder ([place])   . Hypertension   . Chronic back pain   . Gout   . Depression     Past Surgical History  Procedure Laterality Date  . Cesarean section      x2    History reviewed. No pertinent family history.  Social History:  reports that she has been smoking Cigarettes.  She has been smoking about 0.33 packs per day. She has never used smokeless tobacco. She reports that she does not drink alcohol or use illicit drugs.  Allergies:  Allergies  Allergen Reactions  . Lisinopril Swelling    Angioedema   . Chantix [Varenicline]   . Doxycycline Other (See Comments)    Interactions with other medications  . Ibuprofen Nausea And Vomiting  . Lamisil [Terbinafine Hcl] Hives  . Prednisone Other (See Comments)    nervous  . Cephalexin Rash  . Sulfa Antibiotics Anxiety    Medications: I have reviewed the patient's current medications.  Results for orders placed or performed during the hospital encounter of 09/13/15 (from the past 48 hour(s))  CBC with Differential/Platelet     Status: Abnormal   Collection Time: 09/13/15  1:30 AM  Result Value Ref Range   WBC 8.9 4.0 - 10.5 K/uL   RBC 4.71 3.87 - 5.11 MIL/uL   Hemoglobin 14.3 12.0 -  15.0 g/dL   HCT 41.9 36.0 - 46.0 %   MCV 89.0 78.0 - 100.0 fL   MCH 30.4 26.0 - 34.0 pg   MCHC 34.1 30.0 - 36.0 g/dL   RDW 14.0 11.5 - 15.5 %   Platelets 430 (H) 150 - 400 K/uL   Neutrophils Relative % 63 %   Neutro Abs 5.7 1.7 - 7.7 K/uL   Lymphocytes Relative 28 %   Lymphs Abs 2.5 0.7 - 4.0 K/uL   Monocytes Relative 6 %   Monocytes Absolute 0.5 0.1 - 1.0 K/uL   Eosinophils Relative 3 %   Eosinophils Absolute 0.3 0.0 - 0.7 K/uL   Basophils Relative 0 %   Basophils Absolute 0.0 0.0 - 0.1 K/uL  Basic metabolic panel     Status: Abnormal   Collection Time: 09/13/15  1:30 AM  Result Value Ref Range   Sodium 137 135 - 145 mmol/L   Potassium 4.2 3.5 - 5.1 mmol/L   Chloride 104 101 - 111 mmol/L   CO2 24 22 - 32 mmol/L   Glucose, Bld 101 (H) 65 - 99 mg/dL   BUN 6 6 - 20 mg/dL   Creatinine, Ser 0.70 0.44 - 1.00 mg/dL   Calcium 9.7 8.9 - 10.3 mg/dL   GFR calc non Af Amer >60 >60 mL/min   GFR calc Af Amer >60 >60 mL/min    Comment: (NOTE) The eGFR has been calculated using the CKD EPI equation. This  calculation has not been validated in all clinical situations. eGFR's persistently <60 mL/min signify possible Chronic Kidney Disease.    Anion gap 9 5 - 15    No results found.  ROS:ROS 12 systems reviewed and negative except as stated in HPI   Blood pressure 130/117, pulse 73, temperature 98.6 F (37 C), temperature source Oral, resp. rate 26, last menstrual period 05/09/2010, SpO2 96 %.  PHYSICAL EXAM: General appearance - alert and aware of surroundings, patient cooperative and in mild distress Nose - normal and patent, no erythema, discharge or polyps Mouth - Extreme swelling of the anterior tongue with protrusion from the oral cavity, unable to visualize hard and soft palate or posterior oropharynx Neck - supple, no significant adenopathy  Studies Reviewed: None  Assessment/Plan: Patient with progressive tongue swelling despite appropriate medical therapy. Given history  and findings we opted for elective nasotracheal intubation and possible tracheostomy. Risks and benefits of these procedures were discussed in detail with the patient she was taken on emergency basis to [place] for further treatment. Patient to be admitted to the critical care medicine service for postintubation management, plan for the patient remained intubated for several days to allow for resolution of her angioedema.  [place], Jacqueline Reese 09/13/2015, 4:38 AM

## 2015-09-13 NOTE — ED Notes (Signed)
Pt c/o tongue swelling since 2300. Pt states she woke up around 2300 and noticed her tongue was swelling. Tongue swelling has progressively worsened since. Pt reports some swelling in her throat. Pt is in no respiratory distress, respirations are equal and unlabored. Dr. Claudine Mouton aware

## 2015-09-13 NOTE — Anesthesia Preprocedure Evaluation (Addendum)
Anesthesia Evaluation  Patient identified by MRN, date of birth, ID band Patient awake    Reviewed: Allergy & Precautions, Patient's Chart, lab work & pertinent test resultsPreop documentation limited or incomplete due to emergent nature of procedure.  Airway Mallampati: IV  TM Distance: <3 FB   Mouth opening: Limited Mouth Opening Comment: Angioedema. Very large tongue. For emergent awake nasal intubation. Dental  (+) Dental Advisory Given   Pulmonary Current Smoker,    breath sounds clear to auscultation       Cardiovascular hypertension,  Rhythm:Regular Rate:Normal     Neuro/Psych    GI/Hepatic   Endo/Other  diabetes  Renal/GU      Musculoskeletal   Abdominal   Peds  Hematology   Anesthesia Other Findings   Reproductive/Obstetrics                            Anesthesia Physical Anesthesia Plan  ASA: IV and emergent  Anesthesia Plan: MAC   Post-op Pain Management:    Induction:   Airway Management Planned: Nasal ETT and Fiberoptic Intubation Planned  Additional Equipment:   Intra-op Plan:   Post-operative Plan: Post-operative intubation/ventilation  Informed Consent:   Only emergency history available  Plan Discussed with: CRNA, Anesthesiologist and Surgeon  Anesthesia Plan Comments:        Anesthesia Quick Evaluation

## 2015-09-13 NOTE — Progress Notes (Signed)
[facility]  S: intubated in OR this morning, stable currently on vent  O: Filed Vitals:   09/13/15 0645 09/13/15 0700 09/13/15 0756 09/13/15 0812  BP: 109/71 94/65  117/78  Pulse: 69 62  53  Temp:   97.5 F (36.4 C)   TempSrc:   Oral   Resp: 16 14  14   Height:      Weight:      SpO2: 96% 96%  98%   Vent Mode:  [-] PRVC FiO2 (%):  [50 %-60 %] 50 % Set Rate:  [14 bmp] 14 bmp Vt Set:  [470 mL] 470 mL PEEP:  [5 cmH20] 5 cmH20 Plateau Pressure:  [19 cmH20-20 cmH20] 19 cmH20  Sedated, comfortable  7.0 endotracheal tube secured in place, nasal intubation Still with significant tongue swelling but by report this has improved Lungs with a few rhonchi, vent supported breaths CV: RRR, no mgr GI: BS+, soft  BMET    Component Value Date/Time   NA 140 09/13/2015 0525   K 4.2 09/13/2015 0525   CL 109 09/13/2015 0525   CO2 23 09/13/2015 0525   GLUCOSE 156* 09/13/2015 0525   BUN 8 09/13/2015 0525   CREATININE 0.57 09/13/2015 0525   CALCIUM 8.7* 09/13/2015 0525   GFRNONAA >60 09/13/2015 0525   GFRAA >60 09/13/2015 0525    Impression/Plan Ace inhibitor associated angioedema, failed medical therapy, required emergent nasal intubation in OR. Currently stable, comfortable on vent.  Per nursing swelling improved.  Solumedrol probably not going to add much here. >decrease frequency solumedrol >continue oral care > full vent support > extubation later in the week when swelling improved Acute respiratory failure with hypoxemia > abg > CXR Hyperglycemia > SSI  Additional cc time 30 minutes  Roselie Awkward, MD [facility] Pager: [phone] Cell: [phone] After 3pm or if no response, call [phone]

## 2015-09-13 NOTE — ED Notes (Signed)
Jacqueline Reese [phone] daughter Chelsee Dragone [phone] husband

## 2015-09-13 NOTE — Transfer of Care (Signed)
Immediate Anesthesia Transfer of Care Note  Patient: Jacqueline Reese  Procedure(s) Performed: Procedure(s): NASAL ENDOSCOPY WITH NASAL INTABATION (Left)  Patient Location: ICU  Anesthesia Type:MAC  Level of Consciousness: Patient remains intubated per anesthesia plan  Airway & Oxygen Therapy: Patient placed on Ventilator (see vital sign flow sheet for setting)  Post-op Assessment: Report given to RN and Post -op Vital signs reviewed and stable  Post vital signs: Reviewed and stable  Last Vitals:  Filed Vitals:   09/13/15 0315 09/13/15 0330  BP: 132/82 130/117  Pulse: 65 73  Temp:    Resp: 19 26    Last Pain: There were no vitals filed for this visit.       Complications: No apparent anesthesia complications

## 2015-09-13 NOTE — Anesthesia Postprocedure Evaluation (Signed)
Anesthesia Post Note  Patient: Jacqueline Reese  Procedure(s) Performed: Procedure(s) (LRB): NASAL ENDOSCOPY WITH NASAL INTABATION (Left)  Patient location during evaluation: SICU Anesthesia Type: General Level of consciousness: sedated Pain management: pain level controlled Vital Signs Assessment: post-procedure vital signs reviewed and stable Respiratory status: patient remains intubated per anesthesia plan Cardiovascular status: stable Anesthetic complications: no    Last Vitals:  Filed Vitals:   09/13/15 0315 09/13/15 0330  BP: 132/82 130/117  Pulse: 65 73  Temp:    Resp: 19 26    Last Pain: There were no vitals filed for this visit.               [place] DANIEL

## 2015-09-13 NOTE — Op Note (Signed)
Jacqueline Reese, Jacqueline Reese           ACCOUNT NO.:  [id number]  MEDICAL RECORD NO.:  [id number]  LOCATION:  [facility]                         FACILITY:  [facility]  PHYSICIAN:  Early Chars. Wilburn Cornelia, M.D.DATE OF BIRTH:  03-15-1955  DATE OF PROCEDURE:  09/13/2015 DATE OF DISCHARGE:                              OPERATIVE REPORT   LOCATION:  [facility] Main OR.  PREOPERATIVE DIAGNOSES: 1. Angioedema of the tongue. 2. Airway distress.  POSTOPERATIVE DIAGNOSES: 1. Angioedema of the tongue. 2. Airway distress.  PROCEDURE:  Nasotracheal intubation.  SURGEON:  Early Chars. Wilburn Cornelia, MD.  ANESTHESIOLOGIST:  Nelda Severe. Tobias Alexander, M.D.  BRIEF HISTORY:  The patient is a 60 year old black female, who presented to the [facility] Emergency Department with acute swelling of the tongue.  The patient takes lisinopril and ACE inhibitor.  No prior history of reactions or swelling.  She developed rapidly progressive swelling and significant concerns raised regarding possible airway distress.  There was no stridor, but the patient's swelling continued to increase despite appropriate medical therapy.  Given her history and findings, I recommended a nasotracheal intubation and possible tracheostomy.  The risks and benefits were discussed and the patient understood and agreed with this emergency plan.  DESCRIPTION OF PROCEDURE:  The patient was brought to the operating room at [facility] on September 13, 2015, on an emergency basis for nasotracheal intubation and possible tracheostomy.  The patient was positioned on the operating table and her nasal passageway was adequately anesthetized with topical anesthetic.  She was given intravenous sedated anesthesia.  Her airway and oxygenation were stable throughout the procedure.  The fiberoptic nasal telescope was then gently passed through the right nostril.  The airway was visualized and a #7 reinforced endotracheal tube was advanced over the  fiberoptic scope to achieve appropriate nasotracheal intubation.  The patient had good bilateral breath sounds.  The tube was secured, and the patient was placed on sedated anesthesia.  She was then transferred from the operating room to Unit 2H in stable condition.          ______________________________ Early Chars Wilburn Cornelia, M.D.     DLS/MEDQ  D:  [id number]  T:  09/13/2015  Job:  [id number]

## 2015-09-14 ENCOUNTER — Inpatient Hospital Stay (HOSPITAL_COMMUNITY): Payer: Medicare HMO

## 2015-09-14 ENCOUNTER — Encounter (HOSPITAL_COMMUNITY): Payer: Self-pay | Admitting: Otolaryngology

## 2015-09-14 LAB — GLUCOSE, CAPILLARY
GLUCOSE-CAPILLARY: 125 mg/dL — AB (ref 65–99)
GLUCOSE-CAPILLARY: 116 mg/dL — AB (ref 65–99)
Glucose-Capillary: 102 mg/dL — ABNORMAL HIGH (ref 65–99)
Glucose-Capillary: 109 mg/dL — ABNORMAL HIGH (ref 65–99)
Glucose-Capillary: 112 mg/dL — ABNORMAL HIGH (ref 65–99)
Glucose-Capillary: 117 mg/dL — ABNORMAL HIGH (ref 65–99)
Glucose-Capillary: 147 mg/dL — ABNORMAL HIGH (ref 65–99)

## 2015-09-14 LAB — PHOSPHORUS
Phosphorus: 3.1 mg/dL (ref 2.5–4.6)
Phosphorus: 3.2 mg/dL (ref 2.5–4.6)

## 2015-09-14 LAB — CBC WITH DIFFERENTIAL/PLATELET
Basophils Absolute: 0 10*3/uL (ref 0.0–0.1)
Basophils Relative: 0 %
EOS PCT: 0 %
Eosinophils Absolute: 0 10*3/uL (ref 0.0–0.7)
HCT: 36.3 % (ref 36.0–46.0)
Hemoglobin: 12.1 g/dL (ref 12.0–15.0)
LYMPHS ABS: 1.4 10*3/uL (ref 0.7–4.0)
LYMPHS PCT: 13 %
MCH: 29.7 pg (ref 26.0–34.0)
MCHC: 33.3 g/dL (ref 30.0–36.0)
MCV: 89.2 fL (ref 78.0–100.0)
MONO ABS: 0.4 10*3/uL (ref 0.1–1.0)
MONOS PCT: 4 %
Neutro Abs: 8.6 10*3/uL — ABNORMAL HIGH (ref 1.7–7.7)
Neutrophils Relative %: 83 %
PLATELETS: 368 10*3/uL (ref 150–400)
RBC: 4.07 MIL/uL (ref 3.87–5.11)
RDW: 14.7 % (ref 11.5–15.5)
WBC: 10.3 10*3/uL (ref 4.0–10.5)

## 2015-09-14 LAB — BASIC METABOLIC PANEL
Anion gap: 7 (ref 5–15)
BUN: 7 mg/dL (ref 6–20)
CHLORIDE: 109 mmol/L (ref 101–111)
CO2: 22 mmol/L (ref 22–32)
Calcium: 8.8 mg/dL — ABNORMAL LOW (ref 8.9–10.3)
Creatinine, Ser: 0.46 mg/dL (ref 0.44–1.00)
GFR calc Af Amer: >60 mL/min (ref >60)
GFR calc non Af Amer: >60 mL/min (ref >60)
Glucose, Bld: 116 mg/dL — ABNORMAL HIGH (ref 65–99)
POTASSIUM: 3.9 mmol/L (ref 3.5–5.1)
Sodium: 138 mmol/L (ref 135–145)

## 2015-09-14 LAB — MAGNESIUM
Magnesium: 1.7 mg/dL (ref 1.7–2.4)
Magnesium: 1.8 mg/dL (ref 1.7–2.4)

## 2015-09-14 MED ORDER — SPIRONOLACTONE 25 MG PO TABS
50.0000 mg | ORAL_TABLET | Freq: Every day | ORAL | Status: DC
  Administered 2015-09-14 – 2015-09-15 (×2): 50 mg
  Filled 2015-09-14 (×3): qty 2

## 2015-09-14 MED ORDER — CHLORHEXIDINE GLUCONATE CLOTH 2 % EX PADS
6.0000 | MEDICATED_PAD | Freq: Every day | CUTANEOUS | Status: AC
  Administered 2015-09-14 – 2015-09-18 (×5): 6 via TOPICAL

## 2015-09-14 MED ORDER — MUPIROCIN 2 % EX OINT
1.0000 "application " | TOPICAL_OINTMENT | Freq: Two times a day (BID) | CUTANEOUS | Status: DC
  Administered 2015-09-14 – 2015-09-18 (×9): 1 via NASAL
  Filled 2015-09-14 (×2): qty 22

## 2015-09-14 MED ORDER — ESCITALOPRAM OXALATE 10 MG PO TABS
20.0000 mg | ORAL_TABLET | Freq: Every day | ORAL | Status: DC
  Administered 2015-09-14 – 2015-09-15 (×2): 20 mg
  Filled 2015-09-14 (×3): qty 2

## 2015-09-14 MED ORDER — CLONAZEPAM 1 MG PO TABS
1.0000 mg | ORAL_TABLET | Freq: Two times a day (BID) | ORAL | Status: DC
  Administered 2015-09-14 – 2015-09-15 (×3): 1 mg
  Filled 2015-09-14 (×5): qty 1

## 2015-09-14 MED ORDER — VITAL HIGH PROTEIN PO LIQD: 1000.0000 mL | ORAL | Status: DC

## 2015-09-14 MED ORDER — VITAL HIGH PROTEIN PO LIQD
1000.0000 mL | ORAL | Status: DC
  Administered 2015-09-14: 1000 mL
  Administered 2015-09-15: 13:00:00

## 2015-09-14 MED ORDER — FREE WATER
200.0000 mL | Freq: Three times a day (TID) | Status: DC
  Administered 2015-09-14 – 2015-09-15 (×3): 200 mL

## 2015-09-14 MED ORDER — PRO-STAT SUGAR FREE PO LIQD: 30.0000 mL | Freq: Two times a day (BID) | ORAL | Status: DC

## 2015-09-14 MED ORDER — PRO-STAT SUGAR FREE PO LIQD
30.0000 mL | Freq: Three times a day (TID) | ORAL | Status: DC
  Administered 2015-09-14 – 2015-09-15 (×3): 30 mL
  Filled 2015-09-14 (×3): qty 30

## 2015-09-14 MED ORDER — GABAPENTIN 250 MG/5ML PO SOLN
100.0000 mg | Freq: Three times a day (TID) | ORAL | Status: DC
  Administered 2015-09-14 – 2015-09-15 (×2): 100 mg
  Filled 2015-09-14 (×7): qty 2

## 2015-09-14 NOTE — Progress Notes (Signed)
Initial Nutrition Assessment  INTERVENTION:   Start enteral nutrition therapy Vital High Protein @ 30 ml/hr (720 ml) 30 ml Prostat TID Provides: 1020 kcal, 108 grams protein, and 601 ml H2O.  TF regimen and propofol at current rate providing 1717 total kcal/day (100 % of kcal needs)  NUTRITION DIAGNOSIS:   Inadequate oral intake related to inability to eat as evidenced by NPO status.  GOAL:   Patient will meet greater than or equal to 90% of their needs  MONITOR:   TF tolerance, Labs, Weight trends  REASON FOR ASSESSMENT:   Consult Enteral/tube feeding initiation and management  ASSESSMENT:   60 y.o. F admitted 7/11 with ACE-i induced angioedema. Nasally intubated in OR 7/11.  Patient is currently intubated on ventilator support MV: 6.7 L/min Temp (24hrs), Avg:98.5 F (36.9 C), Min:97.8 F (36.6 C), Max:98.9 F (37.2 C)  Propofol: 26.4 ml/hr provides:  Medications reviewed and include: solumedrol Labs reviewed: CBG's: 93-117 Nutrition-Focused physical exam completed. Findings are no fat depletion, no muscle depletion, and no edema on extremities.   Awaiting Cortrak placement.  Spoke with daughter and mother. They report some intentional weight loss recently.  Free water: 200 ml every 8 hours = 600 ml  Diet Order:  Diet NPO time specified  Skin:  Reviewed, no issues  Last BM:  unknown  Height:   Ht Readings from Last 1 Encounters:  09/13/15 5\' 6"  (1.676 m)    Weight:   Wt Readings from Last 1 Encounters:  09/14/15 180 lb 12.4 oz (82 kg)    Ideal Body Weight:  64.5 kg  BMI:  Body mass index is 29.19 kg/(m^2).  Estimated Nutritional Needs:   Kcal:  [id number]  Protein:  105-125 grams  Fluid:  > 1.7 L/day  EDUCATION NEEDS:   No education needs identified at this time  [place], [place], [place] Pager [phone] After Hours Pager

## 2015-09-14 NOTE — Progress Notes (Signed)
PULMONARY / CRITICAL CARE MEDICINE   Name: Jacqueline Reese MRN: [id number] DOB: Feb 18, 1956    ADMISSION DATE:  09/13/2015 CONSULTATION DATE:  09/13/15  REFERRING MD:  Claudine Mouton (EDP)  CHIEF COMPLAINT:  Tongue swelling  SUBJECTIVE:  Sedated on vent   VITAL SIGNS: BP 125/65 mmHg  Pulse 45  Temp(Src) 98.8 F (37.1 C) (Oral)  Resp 14  Ht 5\' 6"  (1.676 m)  Wt 180 lb 12.4 oz (82 kg)  BMI 29.19 kg/m2  SpO2 100%  LMP 05/09/2010  HEMODYNAMICS:    VENTILATOR SETTINGS: Vent Mode:  [-] PRVC FiO2 (%):  [40 %] 40 % Set Rate:  [14 bmp] 14 bmp Vt Set:  [470 mL] 470 mL PEEP:  [5 cmH20] 5 cmH20 Plateau Pressure:  [15 cmH20-18 cmH20] 15 cmH20  INTAKE / OUTPUT: I/O last 3 completed shifts: In: 4124.6 [P.O.:75; I.V.:3999.6; IV Piggyback:50] Out: 2610 [Urine:2585; Blood:25]   PHYSICAL EXAMINATION: General: Middle aged female, currently sedated on vent Neuro: sedated, reaches immediately for NTT,  non-focal.  HEENT: Significant tongue edema still protrudes from mouth w/ copious oral secretions  PERRL, sclerae anicteric. #7 endotracheal tube in right nare Cardiovascular: RRR, no M/R/G.  Lungs: Respirations even and unlabored. Exp wheeze Abdomen: BS x 4, soft, NT/ND.  Musculoskeletal: No gross deformities, no edema.  Skin: Intact, warm, no rashes.  LABS:  BMET  Recent Labs Lab 09/13/15 0130 09/13/15 0525 09/14/15 0430  NA 137 140 138  K 4.2 4.2 3.9  CL 104 109 109  CO2 24 23 22   BUN 6 8 7   CREATININE 0.70 0.57 0.46  GLUCOSE 101* 156* 116*    Electrolytes  Recent Labs Lab 09/13/15 0130 09/13/15 0525 09/14/15 0430  CALCIUM 9.7 8.7* 8.8*  MG  --  1.6*  --   PHOS  --  4.4  --     CBC  Recent Labs Lab 09/13/15 0130 09/13/15 0525 09/14/15 0430  WBC 8.9 11.6* 10.3  HGB 14.3 13.9 12.1  HCT 41.9 41.4 36.3  PLT 430* 419* 368    Coag's No results for input(s): APTT, INR in the last 168 hours.  Sepsis Markers No results for input(s): LATICACIDVEN,  PROCALCITON, O2SATVEN in the last 168 hours.  ABG  Recent Labs Lab 09/13/15 0846  PHART 7.326*  PCO2ART 42.4  PO2ART 93.0    Liver Enzymes No results for input(s): AST, ALT, ALKPHOS, BILITOT, ALBUMIN in the last 168 hours.  Cardiac Enzymes No results for input(s): TROPONINI, PROBNP in the last 168 hours.  Glucose  Recent Labs Lab 09/13/15 0755 09/13/15 1124 09/13/15 1554 09/13/15 2009 09/14/15 0004 09/14/15 0435  GLUCAP 161* 131* 96 93 102* 117*    Imaging Portable Chest Xray  09/14/2015  CLINICAL DATA:  Respiratory failure, hypoxia EXAM: PORTABLE CHEST 1 VIEW COMPARISON:  Portable chest x-ray of September 13, 2015 FINDINGS: The lungs are adequately inflated. Patchy alveolar opacity is present in the right infrahilar region. The left hemidiaphragm is better demonstrated today. There is no pneumothorax or pleural effusion. The heart is top-normal in size. The pulmonary vascularity is not engorged. The endotracheal tube tip projects 3.6 cm above the carina. IMPRESSION: Mild interval improvement in left lower lobe atelectasis. Patchy subsegmental atelectasis in the right infrahilar region is new. The endotracheal tube is in reasonable position. Electronically Signed   By: David  Martinique M.D.   On: 09/14/2015 06:58   Dg Chest Port 1 View  09/13/2015  CLINICAL DATA:  Acute respiratory failure with hypoxemia. EXAM: PORTABLE CHEST 1 VIEW  COMPARISON:  Radiograph of January 31, 2014. FINDINGS: Stable cardiomegaly. No pneumothorax or pleural effusion is noted. No acute pulmonary disease is noted. Distal tip of ventilation tube is seen projected over tracheal air shadow with distal tip 3 cm above the carina. Bony thorax is unremarkable. Right lung is clear. Possible left basilar atelectasis or infiltrate is noted. IMPRESSION: Ventilation tube is seen projected over tracheal air shadow with distal tip 3 cm above the carina and in grossly good position. Mild left basilar opacity is noted concerning  for atelectasis or possibly infiltrate. Electronically Signed   By: Marijo Conception, M.D.   On: 09/13/2015 09:07   Basilar atx, no infiltrates. ETT good position.   STUDIES:  None.  CULTURES: None.  ANTIBIOTICS: None.  SIGNIFICANT EVENTS: 07/11 > admitted with ACE-i induced angioedema. Nasally intubated.    LINES/TUBES: Nasal trach 7/11 >  DISCUSSION: 60 y.o. F admitted 7/11 with ACE-i induced angioedema. Nasally intubated in OR 7/11. Swelling slowly subsiding but not ready for extubation. Will cont supportive care.    ASSESSMENT / PLAN:  PULMONARY A: Acute respiratory failure due to inability to protect airway in the setting of ACE inhibitor induced angioedema. ->angioedema improved but still w/ significant swelling  ->NOT ready for extubation P:   Cont full vent support PAD protocol. Albuterol PRN. Solumedrol, Pepcid, Benadryl scheduled  CARDIOVASCULAR A:  Hx HTN - on lisinopril. P:  D/C LISINOPRIL AND AVOID LIFELONG (Added to allergy list). Hold outpatient aspirin Resume spironolactone via tube  RENAL A:   No acute issues. P:   NS to change to [facility] after TFs start BMP intermittently   GASTROINTESTINAL A:   Nutrition. P:   NPO. Place Cortrak Start nutrition   HEMATOLOGIC A:   VTE Prophylaxis. P:  SCD's / Heparin. CBC intermittently   INFECTIOUS A:   No indication of infection. P:   Monitor clinically.  ENDOCRINE A:   DM. P:   SSI. Hold outpatient metformin, glipizide.  NEUROLOGIC A:   Hx schizophrenia, depression. Sedation  P:   Resume home clonazepam, escitalopram  And neurontin via tube Continue to hold bupropion as it is XL and geodone   Family updated: husband Glendell Docker updated at bedside   Interdisciplinary Family Meeting v Palliative Care Meeting:  Due by: 09/20/15.  CC time: 30 minutes Erick Colace ACNP-BC [place] Pager # [phone] OR # [phone] if no answer  09/14/2015, 8:40 AM

## 2015-09-15 LAB — MAGNESIUM: Magnesium: 1.9 mg/dL (ref 1.7–2.4)

## 2015-09-15 LAB — BASIC METABOLIC PANEL
Anion gap: 7 (ref 5–15)
BUN: 10 mg/dL (ref 6–20)
CALCIUM: 9.1 mg/dL (ref 8.9–10.3)
CO2: 28 mmol/L (ref 22–32)
CREATININE: 0.47 mg/dL (ref 0.44–1.00)
Chloride: 106 mmol/L (ref 101–111)
GFR calc Af Amer: >60 mL/min (ref >60)
Glucose, Bld: 128 mg/dL — ABNORMAL HIGH (ref 65–99)
POTASSIUM: 3.8 mmol/L (ref 3.5–5.1)
SODIUM: 141 mmol/L (ref 135–145)

## 2015-09-15 LAB — GLUCOSE, CAPILLARY
GLUCOSE-CAPILLARY: 145 mg/dL — AB (ref 65–99)
GLUCOSE-CAPILLARY: 116 mg/dL — AB (ref 65–99)
GLUCOSE-CAPILLARY: 113 mg/dL — AB (ref 65–99)
GLUCOSE-CAPILLARY: 104 mg/dL — AB (ref 65–99)
Glucose-Capillary: 124 mg/dL — ABNORMAL HIGH (ref 65–99)

## 2015-09-15 LAB — PHOSPHORUS: PHOSPHORUS: 3.5 mg/dL (ref 2.5–4.6)

## 2015-09-15 MED ORDER — INSULIN ASPART 100 UNIT/ML ~~LOC~~ SOLN
0.0000 [IU] | Freq: Three times a day (TID) | SUBCUTANEOUS | Status: DC
  Administered 2015-09-16: 2 [IU] via SUBCUTANEOUS
  Administered 2015-09-16: 3 [IU] via SUBCUTANEOUS
  Administered 2015-09-17 – 2015-09-18 (×3): 2 [IU] via SUBCUTANEOUS

## 2015-09-15 MED ORDER — IPRATROPIUM-ALBUTEROL 0.5-2.5 (3) MG/3ML IN SOLN
3.0000 mL | Freq: Four times a day (QID) | RESPIRATORY_TRACT | Status: DC
  Administered 2015-09-15 – 2015-09-17 (×8): 3 mL via RESPIRATORY_TRACT
  Filled 2015-09-15 (×8): qty 3

## 2015-09-15 MED ORDER — CHLORHEXIDINE GLUCONATE 0.12 % MT SOLN
15.0000 mL | Freq: Two times a day (BID) | OROMUCOSAL | Status: DC
  Administered 2015-09-15 – 2015-09-18 (×6): 15 mL via OROMUCOSAL
  Filled 2015-09-15 (×6): qty 15

## 2015-09-15 MED ORDER — FAMOTIDINE 40 MG/5ML PO SUSR
40.0000 mg | Freq: Every day | ORAL | Status: DC
  Administered 2015-09-15: 40 mg
  Filled 2015-09-15: qty 5

## 2015-09-15 MED ORDER — FENTANYL CITRATE (PF) 100 MCG/2ML IJ SOLN: 12.5000 ug | INTRAMUSCULAR | Status: DC | PRN

## 2015-09-15 MED ORDER — ZIPRASIDONE HCL 80 MG PO CAPS
240.0000 mg | ORAL_CAPSULE | Freq: Every day | ORAL | Status: DC
  Administered 2015-09-15 – 2015-09-17 (×3): 240 mg via ORAL
  Filled 2015-09-15 (×4): qty 3

## 2015-09-15 MED ORDER — CETYLPYRIDINIUM CHLORIDE 0.05 % MT LIQD
7.0000 mL | Freq: Two times a day (BID) | OROMUCOSAL | Status: DC
  Administered 2015-09-15 – 2015-09-18 (×7): 7 mL via OROMUCOSAL

## 2015-09-15 NOTE — Progress Notes (Signed)
eLink Physician-Brief Progress Note Patient Name: Jacqueline Reese DOB: February 09, 1956 MRN: [id number]   Date of Service  09/15/2015  HPI/Events of Note  RN said, per husband, pt needs her psych meds for shizophrenia.   She is on her psych meds except for geodon. RN calls up pts pharmacy to verify.  Pt has been on  geodon 240 mg at hs for awhile. Listed in her outpt meds as well.   QTc is not prolonged on monitor.  eICU Interventions  Restart geodon 240 mg hs. ekg in am to check qtc     Intervention Category Intermediate Interventions: Other:  [place] 09/15/2015, 7:00 PM

## 2015-09-15 NOTE — Progress Notes (Signed)
250 mc of Fentanyl wasted in the sink witnessed by Verlin Grills, RN.

## 2015-09-15 NOTE — Progress Notes (Signed)
Patient very agitated and is fearful of RN.  Patient will not allow RN to administer medication or to touch patient.  RN called patient's husband and informed him of patient's status, patient's husband advised he would come to hospital to assist in providing support and reassurance to the patient.

## 2015-09-15 NOTE — Progress Notes (Signed)
PULMONARY / CRITICAL CARE MEDICINE   Name: Jacqueline Reese MRN: [id number] DOB: [birth year]    ADMISSION DATE:  09/13/2015 CONSULTATION DATE:  09/13/15  REFERRING MD:  Claudine Mouton (EDP)  CHIEF COMPLAINT:  Tongue swelling  SUBJECTIVE:  Sedated on vent   VITAL SIGNS: BP 98/60 mmHg  Pulse 42  Temp(Src) 99.2 F (37.3 C) (Oral)  Resp 14  Ht 5\' 6"  (1.676 m)  Wt 179 lb 14.3 oz (81.6 kg)  BMI 29.05 kg/m2  SpO2 99%  LMP 05/09/2010  HEMODYNAMICS:    VENTILATOR SETTINGS: Vent Mode:  [-] PRVC FiO2 (%):  [30 %-40 %] 30 % Set Rate:  [14 bmp] 14 bmp Vt Set:  [470 mL] 470 mL PEEP:  [5 cmH20] 5 cmH20 Plateau Pressure:  [10 [id number] cmH20] 21 cmH20  INTAKE / OUTPUT: I/O last 3 completed shifts: In: 3657.1 [P.O.:75; I.V.:3118.1; NG/GT:414; IV Piggyback:50] Out: [id number] [Urine:3335]   PHYSICAL EXAMINATION: General: Middle aged female, currently sedated on vent Neuro: sedated, reaches immediately for NTT,  non-focal.  HEENT:tongue edema still protrudes from mouth w/ copious oral secretions-->but still improved  PERRL, sclerae anicteric. #7 endotracheal tube in right nare Cardiovascular: RRR, no M/R/G.  Lungs: Respirations even and unlabored. Scattered rhonchi Abdomen: BS x 4, soft, NT/ND.  Musculoskeletal: No gross deformities, no edema.  Skin: Intact, warm, no rashes.  LABS:  BMET  Recent Labs Lab 09/13/15 0525 09/14/15 0430 09/15/15 0536  NA 140 138 141  K 4.2 3.9 3.8  CL 109 109 106  CO2 23 22 28   BUN 8 7 10   CREATININE 0.57 0.46 0.47  GLUCOSE 156* 116* 128*    Electrolytes  Recent Labs Lab 09/13/15 0525 09/14/15 0430 09/14/15 0912 09/14/15 1812 09/15/15 0536  CALCIUM 8.7* 8.8*  --   --  9.1  MG 1.6*  --  1.7 1.8 1.9  PHOS 4.4  --  3.1 3.2 3.5    CBC  Recent Labs Lab 09/13/15 0130 09/13/15 0525 09/14/15 0430  WBC 8.9 11.6* 10.3  HGB 14.3 13.9 12.1  HCT 41.9 41.4 36.3  PLT 430* 419* 368    Coag's No results for input(s): APTT, INR in the last  168 hours.  Sepsis Markers No results for input(s): LATICACIDVEN, PROCALCITON, O2SATVEN in the last 168 hours.  ABG  Recent Labs Lab 09/13/15 0846  PHART 7.326*  PCO2ART 42.4  PO2ART 93.0    Liver Enzymes No results for input(s): AST, ALT, ALKPHOS, BILITOT, ALBUMIN in the last 168 hours.  Cardiac Enzymes No results for input(s): TROPONINI, PROBNP in the last 168 hours.  Glucose  Recent Labs Lab 09/14/15 0846 09/14/15 1217 09/14/15 1748 09/14/15 2046 09/14/15 2320 09/15/15 0351  GLUCAP 109* 125* 116* 112* 147* 145*    Imaging Dg Abd Portable 1v  09/14/2015  CLINICAL DATA:  Feeding tube placement EXAM: PORTABLE ABDOMEN - 1 VIEW COMPARISON:  None. FINDINGS: There is a feeding tube with the tip projecting over the antrum of the stomach. There is no bowel dilatation to suggest obstruction. There is no evidence of pneumoperitoneum, portal venous gas or pneumatosis. There are no pathologic calcifications along the expected course of the ureters. The osseous structures are unremarkable. IMPRESSION: Feeding tube with the tip projecting over the antrum of the stomach. There is no bowel dilatation to suggest obstruction. Electronically Signed   By: Kathreen Devoid   On: 09/14/2015 16:50    STUDIES:  None.  CULTURES: None.  ANTIBIOTICS: None.  SIGNIFICANT EVENTS: 07/11 > admitted with ACE-i induced angioedema.  Nasally intubated.    LINES/TUBES: Nasal trach 7/11 >  DISCUSSION: 60 y.o. F admitted 7/11 with ACE-i induced angioedema. Nasally intubated in OR 7/11. Swelling slowly subsiding but still  not ready for extubation. Will cont supportive care.    ASSESSMENT / PLAN:  PULMONARY A: Acute respiratory failure due to inability to protect airway in the setting of ACE inhibitor induced angioedema. ->angioedema improved ->suspect that she will be ready in next 24hrs or so ->NOT ready for extubation P:   Cont full vent support PAD protocol. Albuterol PRN. Solumedrol,  Pepcid, Benadryl scheduled  CARDIOVASCULAR A:  Hx HTN - on lisinopril. P:  D/C LISINOPRIL AND AVOID LIFELONG (Added to allergy list). Hold outpatient aspirin Resumed spironolactone via tube  RENAL A:   No acute issues. P:   BMP intermittently   GASTROINTESTINAL A:   Nutrition. P:   Cont tubefeeds Will need swallow eval s/p extubation  HEMATOLOGIC A:   VTE Prophylaxis. P:  SCD's / Heparin. CBC intermittently   INFECTIOUS A:   Low grade fever (7/13) She is at risk for sinusitis given nasal intubation  P:   Monitor clinically.  ENDOCRINE A:   DM. P:   SSI. Hold outpatient metformin, glipizide.  NEUROLOGIC A:   Hx schizophrenia, depression. Sedation  P:   Resumed home clonazepam, escitalopram  And neurontin via tube Continue to hold bupropion as it is XL and geodone Will cont diprivan for now; w/ psych hx may need precedex when we initiate weaning  Daily wake-up assessments  Family updated: husband Glendell Docker updated at bedside   Interdisciplinary Family Meeting v Palliative Care Meeting:  Due by: 09/20/15.  CC time: 20 minutes Erick Colace ACNP-BC [place] Pager # [phone] OR # [phone] if no answer  09/15/2015, 8:42 AM

## 2015-09-15 NOTE — Progress Notes (Signed)
[facility] PCCM  Called to bedside to assess.  No tongue swelling Has a cuff leak Passing SBT  Plan  Extubate  Roselie Awkward, MD [place] [facility] Pager: [phone] Cell: [phone] After 3pm or if no response, call [phone]

## 2015-09-15 NOTE — Progress Notes (Signed)
eLink Physician-Brief Progress Note Patient Name: Jacqueline Reese DOB: 1955-08-22 MRN: [id number]   Date of Service  09/15/2015  HPI/Events of Note  Pt extubated and RN asking a change in timing of CBG checking.   eICU Interventions  Change FS to ac and hs        [place] 09/15/2015, 10:55 PM

## 2015-09-15 NOTE — Progress Notes (Signed)
NP Kary Kos advised ok to perform wake up assessment on patient but to monitor patient closely to ensure airway protection.

## 2015-09-15 NOTE — Progress Notes (Signed)
Patient's temperature 100.9, previously 99.2.  Called and spoke with NP Kary Kos to inform.  NP Kary Kos advised to continue to monitor patient and to contact NP if patient's temperature greater than/equal to 101.5 with additional symptoms such as tachycardia.

## 2015-09-15 NOTE — Progress Notes (Signed)
Patient refused to take all of treatment. RN aware.

## 2015-09-15 NOTE — Progress Notes (Signed)
Nutrition Follow-up  INTERVENTION:  Supplement diet as needed once advanced.   NUTRITION DIAGNOSIS:   Inadequate oral intake related to acute illness as evidenced by NPO status. Ongoing.   GOAL:   Patient will meet greater than or equal to 90% of their needs Not yet met.   MONITOR:   Diet advancement, I & O's  ASSESSMENT:   60 y.o. F admitted 7/11 with ACE-i induced angioedema. Nasally intubated in OR 7/11.  7/13 extubated Pt agitated refusing care  Medications reviewed and include: solumedrol Labs reviewed; CBG's: 116-147   Diet Order:  Diet NPO time specified  Skin:  Reviewed, no issues  Last BM:  unknown  Height:   Ht Readings from Last 1 Encounters:  09/13/15 5' 6"  (1.676 m)    Weight:   Wt Readings from Last 1 Encounters:  09/15/15 179 lb 14.3 oz (81.6 kg)    Ideal Body Weight:  64.5 kg  BMI:  Body mass index is 29.05 kg/(m^2).  Estimated Nutritional Needs:   Kcal:  1600-1800  Protein:  85-95 grams  Fluid:  > 1.7 L/day  EDUCATION NEEDS:   No education needs identified at this time  [place], [place], [place] Pager [phone] After Hours Pager

## 2015-09-15 NOTE — Procedures (Signed)
Extubation Procedure Note  Patient Details:   Name: Jacqueline Reese DOB: 08-Mar-1955 MRN: [id number]   Airway Documentation:     Evaluation  O2 sats: stable throughout Complications: No apparent complications Patient did tolerate procedure well. Bilateral Breath Sounds: Rhonchi   Yes  Patient tolerated wean. MD ordered to extubate. Positive for cuff leak. Patient extubated to a 4 Lpm nasal cannula. No signs of dyspnea or stridor. Patient resting comfortably. RN at bedside.   Myrtie Neither 09/15/2015, 1:39 PM

## 2015-09-15 NOTE — Progress Notes (Signed)
Patient and patient's family requested that RN review patient's medications due to unsure if patient is on correct pre-hospital medication for her schizophrenia.  Patient's husband and patient is stating she takes Haldol, this medication is not listed on PTA list.  Called patient's pharmacy, [facility] on [address], patient does not have a prescription for Haldol.  Patient does have a prescription for Geodon and per PTA med list the ordered dose is 240 mg every evening.  Called eLink and spoke with MD Corrie Dandy, MD to review Geodon dose with pharmacy and advise.

## 2015-09-16 ENCOUNTER — Inpatient Hospital Stay (HOSPITAL_COMMUNITY): Payer: Medicare HMO

## 2015-09-16 LAB — GLUCOSE, CAPILLARY
GLUCOSE-CAPILLARY: 154 mg/dL — AB (ref 65–99)
GLUCOSE-CAPILLARY: 122 mg/dL — AB (ref 65–99)
Glucose-Capillary: 113 mg/dL — ABNORMAL HIGH (ref 65–99)

## 2015-09-16 LAB — CBC
HEMATOCRIT: 38.6 % (ref 36.0–46.0)
HEMOGLOBIN: 12.5 g/dL (ref 12.0–15.0)
MCH: 29.3 pg (ref 26.0–34.0)
MCHC: 32.4 g/dL (ref 30.0–36.0)
MCV: 90.4 fL (ref 78.0–100.0)
PLATELETS: 415 10*3/uL — AB (ref 150–400)
RBC: 4.27 MIL/uL (ref 3.87–5.11)
RDW: 14.5 % (ref 11.5–15.5)
WBC: 11.6 10*3/uL — AB (ref 4.0–10.5)

## 2015-09-16 LAB — COMPREHENSIVE METABOLIC PANEL
ALBUMIN: 3 g/dL — AB (ref 3.5–5.0)
ALT: 65 U/L — ABNORMAL HIGH (ref 14–54)
ANION GAP: 12 (ref 5–15)
AST: 47 U/L — ABNORMAL HIGH (ref 15–41)
Alkaline Phosphatase: 45 U/L (ref 38–126)
BUN: 11 mg/dL (ref 6–20)
CALCIUM: 9.2 mg/dL (ref 8.9–10.3)
CHLORIDE: 103 mmol/L (ref 101–111)
CO2: 27 mmol/L (ref 22–32)
Creatinine, Ser: 0.45 mg/dL (ref 0.44–1.00)
GFR calc non Af Amer: >60 mL/min (ref >60)
Glucose, Bld: 111 mg/dL — ABNORMAL HIGH (ref 65–99)
POTASSIUM: 4.1 mmol/L (ref 3.5–5.1)
SODIUM: 142 mmol/L (ref 135–145)
Total Bilirubin: 1.3 mg/dL — ABNORMAL HIGH (ref 0.3–1.2)
Total Protein: 6.1 g/dL — ABNORMAL LOW (ref 6.5–8.1)

## 2015-09-16 MED ORDER — FAMOTIDINE 20 MG PO TABS
40.0000 mg | ORAL_TABLET | Freq: Every day | ORAL | Status: DC
  Administered 2015-09-16 – 2015-09-18 (×3): 40 mg via ORAL
  Filled 2015-09-16 (×3): qty 2

## 2015-09-16 MED ORDER — CLONAZEPAM 1 MG PO TABS
1.0000 mg | ORAL_TABLET | Freq: Two times a day (BID) | ORAL | Status: DC
  Administered 2015-09-16 – 2015-09-18 (×5): 1 mg via ORAL
  Filled 2015-09-16 (×4): qty 1

## 2015-09-16 MED ORDER — GLIPIZIDE 5 MG PO TABS
2.5000 mg | ORAL_TABLET | Freq: Every day | ORAL | Status: DC
  Administered 2015-09-17: 2.5 mg via ORAL
  Filled 2015-09-16: qty 1

## 2015-09-16 MED ORDER — DIPHENHYDRAMINE HCL 25 MG PO CAPS
25.0000 mg | ORAL_CAPSULE | Freq: Four times a day (QID) | ORAL | Status: DC
  Administered 2015-09-17 – 2015-09-18 (×3): 25 mg via ORAL
  Filled 2015-09-16 (×4): qty 1

## 2015-09-16 MED ORDER — AMLODIPINE BESYLATE 5 MG PO TABS
5.0000 mg | ORAL_TABLET | Freq: Every day | ORAL | Status: DC
  Administered 2015-09-16 – 2015-09-18 (×3): 5 mg via ORAL
  Filled 2015-09-16 (×3): qty 1

## 2015-09-16 MED ORDER — GABAPENTIN 100 MG PO CAPS
100.0000 mg | ORAL_CAPSULE | Freq: Three times a day (TID) | ORAL | Status: DC
  Administered 2015-09-16 – 2015-09-18 (×7): 100 mg via ORAL
  Filled 2015-09-16 (×7): qty 1

## 2015-09-16 MED ORDER — SPIRONOLACTONE 25 MG PO TABS
50.0000 mg | ORAL_TABLET | Freq: Every day | ORAL | Status: DC
  Administered 2015-09-16 – 2015-09-18 (×3): 50 mg via ORAL
  Filled 2015-09-16 (×2): qty 2

## 2015-09-16 MED ORDER — METFORMIN HCL 500 MG PO TABS
500.0000 mg | ORAL_TABLET | Freq: Two times a day (BID) | ORAL | Status: DC
  Administered 2015-09-16 – 2015-09-18 (×4): 500 mg via ORAL
  Filled 2015-09-16 (×4): qty 1

## 2015-09-16 MED ORDER — ESCITALOPRAM OXALATE 20 MG PO TABS
20.0000 mg | ORAL_TABLET | Freq: Every day | ORAL | Status: DC
  Administered 2015-09-16 – 2015-09-18 (×3): 20 mg via ORAL
  Filled 2015-09-16 (×2): qty 1

## 2015-09-16 NOTE — Evaluation (Signed)
Clinical/Bedside Swallow Evaluation Patient Details  Name: Jacqueline Reese MRN: [id number] Date of Birth: 05-Jun-1955  Today's Date: 09/16/2015 Time: SLP Start Time (ACUTE ONLY): 0948 SLP Stop Time (ACUTE ONLY): 1001 SLP Time Calculation (min) (ACUTE ONLY): 13 min  Past Medical History:  Past Medical History  Diagnosis Date  . Diabetes mellitus without complication ([place])   . Schizophrenic disorder ([place])   . Hypertension   . Chronic back pain   . Gout   . Depression    Past Surgical History:  Past Surgical History  Procedure Laterality Date  . Cesarean section      x2  . Nasal endoscopy Left 09/13/2015    Procedure: NASAL ENDOSCOPY WITH NASAL INTABATION;  Surgeon: Jerrell Belfast, MD;  Location[facility] OR;  Service: ENT;  Laterality: Left;   HPI:  60 y.o. female admitted to ED with acute anterior tongue swelling.  Hx DM, schizophrenic disorder, depression.  Underwent nasotracheal intubation per ENT on 7/11, extubated 7/13.  Improved angioedema.    Assessment / Plan / Recommendation Clinical Impression  Pt presents with normal oropharyngeal swallow function marked by adequate mastication, appropriate swallow/respiratory reciprocity, brisk swallow response, no overt s/s of aspiration for any consistencies. Passed 3 oz water test without difficulty.  Recommend resuming regular consistency diet, thin liquids, meds whole in liquid.  No SLP f/u warranted.      Aspiration Risk  No limitations    Diet Recommendation   carb modified, thin liquids.   Medication Administration: Whole meds with liquid    Other  Recommendations Oral Care Recommendations: Oral care BID   Follow up Recommendations  None    Frequency and Duration            Prognosis        Swallow Study   General Date of Onset: 09/13/15 HPI: 60 y.o. female admitted to ED with acute anterior tongue swelling.  Hx DM, schizophrenic disorder, depression.  Underwent nasotracheal intubation per ENT on 7/11, extubated  7/13.  Improved angioedema.  Type of Study: Bedside Swallow Evaluation Previous Swallow Assessment: no Diet Prior to this Study: NPO Temperature Spikes Noted: No Respiratory Status: Room air History of Recent Intubation: Yes Length of Intubations (days): 2 days Date extubated: 09/15/15 Behavior/Cognition: Alert;Pleasant mood Oral Cavity Assessment: Within Functional Limits Oral Care Completed by SLP: No Oral Cavity - Dentition: Adequate natural dentition Vision: Functional for self-feeding Self-Feeding Abilities: Able to feed self Patient Positioning: Upright in chair Baseline Vocal Quality: Normal Volitional Cough: Strong Volitional Swallow: Able to elicit    Oral/Motor/Sensory Function Overall Oral Motor/Sensory Function: Within functional limits   Ice Chips Ice chips: Within functional limits Presentation: Spoon   Thin Liquid Thin Liquid: Within functional limits Presentation: Cup;Self Fed    Nectar Thick Nectar Thick Liquid: Not tested   Honey Thick Honey Thick Liquid: Not tested   Puree Puree: Within functional limits   Solid   GO   Solid: Within functional limits Presentation: [place]. [name], [place] CCC/SLP Pager [phone]  Jacqueline Reese 09/16/2015,10:07 AM

## 2015-09-16 NOTE — Progress Notes (Signed)
IV leaking.  Spoke to MD.  Faythe Ghee to leave IV out.  Will give PO benedryl.

## 2015-09-16 NOTE — Progress Notes (Signed)
PULMONARY / CRITICAL CARE MEDICINE   Name: Jacqueline Reese MRN: [id number] DOB: [birth year]    ADMISSION DATE:  09/13/2015 CONSULTATION DATE:  09/13/15  REFERRING MD:  Claudine Mouton (EDP)  CHIEF COMPLAINT:  Tongue swelling  SUBJECTIVE:  Extubated yesterday, doing well  VITAL SIGNS: BP 147/65 mmHg  Pulse 56  Temp(Src) 97.7 F (36.5 C) (Oral)  Resp 15  Ht 5\' 6"  (1.676 m)  Wt 79.6 kg (175 lb 7.8 oz)  BMI 28.34 kg/m2  SpO2 93%  LMP 05/09/2010  HEMODYNAMICS:    VENTILATOR SETTINGS: Vent Mode:  [-] PSV;CPAP FiO2 (%):  [30 %-40 %] 40 % Set Rate:  [14 bmp] 14 bmp Vt Set:  [470 mL] 470 mL PEEP:  [5 cmH20] 5 cmH20 Pressure Support:  [5 cmH20] 5 cmH20 Plateau Pressure:  [17 cmH20] 17 cmH20  INTAKE / OUTPUT: I/O last 3 completed shifts: In: 1606.3 [I.V.:911.3; NG/GT:645; IV Piggyback:50] Out: 3700 [Urine:3700]   PHYSICAL EXAMINATION: General: comfortable HENT: NCAT EOMi, tongue swelling PULM: CTA B CV: RRR, no mgr GI: BS+, soft, nontender MSK: normal bulk and tone  LABS:  BMET  Recent Labs Lab 09/14/15 0430 09/15/15 0536 09/16/15 0258  NA 138 141 142  K 3.9 3.8 4.1  CL 109 106 103  CO2 22 28 27   BUN 7 10 11   CREATININE 0.46 0.47 0.45  GLUCOSE 116* 128* 111*    Electrolytes  Recent Labs Lab 09/14/15 0430 09/14/15 0912 09/14/15 1812 09/15/15 0536 09/16/15 0258  CALCIUM 8.8*  --   --  9.1 9.2  MG  --  1.7 1.8 1.9  --   PHOS  --  3.1 3.2 3.5  --     CBC  Recent Labs Lab 09/13/15 0525 09/14/15 0430 09/16/15 0258  WBC 11.6* 10.3 11.6*  HGB 13.9 12.1 12.5  HCT 41.4 36.3 38.6  PLT 419* 368 415*    Coag's No results for input(s): APTT, INR in the last 168 hours.  Sepsis Markers No results for input(s): LATICACIDVEN, PROCALCITON, O2SATVEN in the last 168 hours.  ABG  Recent Labs Lab 09/13/15 0846  PHART 7.326*  PCO2ART 42.4  PO2ART 93.0    Liver Enzymes  Recent Labs Lab 09/16/15 0258  AST 47*  ALT 65*  ALKPHOS 45  BILITOT  1.3*  ALBUMIN 3.0*    Cardiac Enzymes No results for input(s): TROPONINI, PROBNP in the last 168 hours.  Glucose  Recent Labs Lab 09/15/15 0351 09/15/15 0736 09/15/15 1113 09/15/15 1615 09/15/15 1949 09/16/15 0830  GLUCAP 145* 116* 124* 113* 104* 113*    Imaging Dg Chest Port 1 View  09/16/2015  CLINICAL DATA:  Acute respiratory failure EXAM: PORTABLE CHEST 1 VIEW COMPARISON:  Portable chest x-ray of September 14, 2015 FINDINGS: The lungs are less well inflated today. Mild interstitial prominence at both bases is present with coarsening of the lung markings in the left perihilar region. There are no air bronchograms. There is no significant pleural effusion. The heart is top-normal in size but stable. The pulmonary vascularity is not clearly engorged. IMPRESSION: Hypo inflation accentuates the interstitial markings bilaterally but a true increase is present suggesting mild interstitial edema or early interstitial pneumonia. Electronically Signed   By: David  Martinique M.D.   On: 09/16/2015 07:23   Basilar atx, no infiltrates. ETT good position.   STUDIES:  None.  CULTURES: None.  ANTIBIOTICS: None.  SIGNIFICANT EVENTS: 07/11 > admitted with ACE-i induced angioedema. Nasally intubated.  7/14 >    LINES/TUBES: Nasal endotracheal tube  7/11 > 7/14  DISCUSSION: 60 y.o. F admitted 7/11 with ACE-i induced angioedema. Nasally intubated in OR 7/11. Extubated 7/14, no residual swelling  ASSESSMENT / PLAN:  PULMONARY A: Acute respiratory failure due to inability to protect airway in the setting of ACE inhibitor induced angioedema ->angioedema improved but still w/ significant swelling  P:   Stay away from linsinopril for life Advance diet  CARDIOVASCULAR A:  Hx HTN - on lisinopril P:  D/C LISINOPRIL AND AVOID LIFELONG (Added to allergy list) Hold outpatient aspirin Resume spironolactone via tube  RENAL A:   No acute issues P:   KVO fluids  GASTROINTESTINAL A:    Nutrition P:   Regular diet  HEMATOLOGIC A:   VTE Prophylaxis. P:  SCD's / Heparin. CBC intermittently   INFECTIOUS A:   No indication of infection P:   Monitor clinically  ENDOCRINE A:   DM P:   SSI Restart outpatient metformin, glipizide  NEUROLOGIC A:   Hx schizophrenia, depression Feels weak, unsteady, normal neuro exam P:   PT consult ambulate   Family updated: husband Glendell Docker updated at bedside 7/14, advised we are near going home, because she feels unsteady they want her to see PT  Interdisciplinary Family Meeting v Palliative Care Meeting:  Due by: 09/20/15.  Roselie Awkward, MD [facility] Pager: [phone] Cell: [phone] After 3pm or if no response, call [phone]   09/16/2015, 10:01 AM

## 2015-09-16 NOTE — Discharge Summary (Signed)
Physician Discharge Summary       Patient ID: Jacqueline Reese MRN: [id number] DOB/AGE: 1955/03/06 60 y.o.  Admit date: 09/13/2015 Discharge date: 09/18/2015  Discharge Diagnoses:  Acute respiratory failure due to inability to protect airway in the setting of ACE inhibitor induced angioedema Hx HTN - on lisinopril DM Hx of schizophrenia, depression Physical Deconditioning   Detailed Hospital Course:   60 y.o. female sig h/o HTN for which she is on lisinopril. She presented to [facility] ED 09/13/15 with acute onset tongue swelling that began roughly 2330 the night prior. Swelling has progressed since onset to the point where she can now not control her secretions / saliva. She apparently tried some new essential oils the night prior for leg pain but otherwise, no new exposures. She was seen by anesthesia in ED who felt that pt could be admitted to ICU with close monitoring. Upon our arrival to bedside, pt had increased tongue edema to the point where her entire mouth was occluded, it was difficult to understand her, and she could not control secretions. She was intubated nasally in the OR and admitted to the ICU. Her treatment was mainly supportive with the primary intervention being discontinuation of the Ace-I and time. She was briefly on steroids and H2 blockade but this was stopped as there is little evidence that ace-I induced angio-edema responds to this. We were able to successfully extubate her on 6/13. She remained hospitalized until 7/16  due to residual weakness.  She was seen in consultation by physical therapy who recommend home health PT which will be ordered through case management. She was deemed ready for d/c with the plan as outlined below.    Discharge Plan by active problems   Acute respiratory failure due to inability to protect airway in the setting of ACE inhibitor induced angioedema; Resolved.  Plan lisinopril listed as a allergy Her primary MD office has been  notified Cont benadryl, pepcid x 3 more days   HTN Plan Changed lisinopril to Norvasc Continue aldactone  DM Plan Resume home regimen   Hx of schizophrenia, depression Plan Resume home meds  Physical Deconditioning  Plan Cont rehab efforts  Home health PT  Rolling walker   Urinary retention - resolved.  Plan -  PCP f/u     Significant Hospital tests/ studies  Consults: none   Discharge Exam: BP 110/53 mmHg  Pulse 76  Temp(Src) 99.5 F (37.5 C) (Oral)  Resp 18  Ht 5\' 6"  (1.676 m)  Wt 77.202 kg (170 lb 3.2 oz)  BMI 27.48 kg/m2  SpO2 97%  LMP 05/09/2010  General: comfortable HENT: NCAT EOMi, tongue swelling-> resolved  PULM: CTA B CV: RRR, no mgr GI: BS+, soft, nontender MSK: normal bulk and tone  Labs at discharge Lab Results  Component Value Date   CREATININE 0.48 09/17/2015   BUN 13 09/17/2015   NA 141 09/17/2015   K 2.8* 09/17/2015   CL 104 09/17/2015   CO2 28 09/17/2015   Lab Results  Component Value Date   WBC 11.6* 09/16/2015   HGB 12.5 09/16/2015   HCT 38.6 09/16/2015   MCV 90.4 09/16/2015   PLT 415* 09/16/2015   Lab Results  Component Value Date   ALT 65* 09/16/2015   AST 47* 09/16/2015   ALKPHOS 45 09/16/2015   BILITOT 1.3* 09/16/2015   No results found for: INR, PROTIME  Current radiology studies No results found.  Disposition:  01-Home or Self Care     Medication List  STOP taking these medications        meloxicam 7.5 MG tablet  Commonly known as:  MOBIC     PRILOSEC 20 MG capsule  Generic drug:  omeprazole      TAKE these medications        albuterol 108 (90 Base) MCG/ACT inhaler  Commonly known as:  PROVENTIL HFA;VENTOLIN HFA  Inhale 2 puffs into the lungs every 4 (four) hours as needed for wheezing or shortness of breath.     allopurinol 100 MG tablet  Commonly known as:  ZYLOPRIM  Take 100 mg by mouth 2 (two) times daily.     amLODipine 5 MG tablet  Commonly known as:  NORVASC  Take 1 tablet (5  mg total) by mouth daily.     aspirin EC 325 MG tablet  Take 325 mg by mouth daily.     buPROPion 150 MG 24 hr tablet  Commonly known as:  WELLBUTRIN XL  Take 150 mg by mouth daily.     clonazePAM 1 MG tablet  Commonly known as:  KLONOPIN  Take 1 mg by mouth 2 (two) times daily.     cyclobenzaprine 10 MG tablet  Commonly known as:  FLEXERIL  Take 10 mg by mouth 3 (three) times daily as needed for muscle spasms.     diphenhydrAMINE 25 mg capsule  Commonly known as:  BENADRYL  Take 1 capsule (25 mg total) by mouth 2 (two) times daily.     famotidine 40 MG tablet  Commonly known as:  PEPCID  Take 1 tablet (40 mg total) by mouth daily.     gabapentin 100 MG capsule  Commonly known as:  NEURONTIN  Take 100 mg by mouth 3 (three) times daily.     glipiZIDE 2.5 MG 24 hr tablet  Commonly known as:  GLUCOTROL XL  Take 2.5 mg by mouth daily.     LEXAPRO 20 MG tablet  Generic drug:  escitalopram  Take 20 mg by mouth daily.     metFORMIN 500 MG tablet  Commonly known as:  GLUCOPHAGE  Take 500 mg by mouth 2 (two) times daily with a meal.     spironolactone 50 MG tablet  Commonly known as:  ALDACTONE  Take 50 mg by mouth daily.     traMADol 50 MG tablet  Commonly known as:  ULTRAM  Take 50 mg by mouth every 6 (six) hours as needed.     ziprasidone 80 MG capsule  Commonly known as:  GEODON  Take 240 mg by mouth every evening.       Follow-up Information    Follow up with Philis Fendt, MD On 09/23/2015.   Specialty:  Internal Medicine   Why:  1030am .    Contact information:   [address] [place] [address] [phone]       Discharged Condition: stable  Physician Statement:   The Patient was personally examined, the discharge assessment and plan has been personally reviewed and I agree with ACNP Whtieheart's assessment and plan. > 30 minutes of time have been dedicated to discharge assessment, planning and discharge instructions.   SignedNickolas Madrid, NP 09/18/2015  11:32 AM Pager: [phone] or [phone]

## 2015-09-17 LAB — BASIC METABOLIC PANEL
Anion gap: 9 (ref 5–15)
BUN: 13 mg/dL (ref 6–20)
CHLORIDE: 104 mmol/L (ref 101–111)
CO2: 28 mmol/L (ref 22–32)
Calcium: 9.2 mg/dL (ref 8.9–10.3)
Creatinine, Ser: 0.48 mg/dL (ref 0.44–1.00)
GFR calc Af Amer: >60 mL/min (ref >60)
GFR calc non Af Amer: >60 mL/min (ref >60)
GLUCOSE: 112 mg/dL — AB (ref 65–99)
POTASSIUM: 2.8 mmol/L — AB (ref 3.5–5.1)
Sodium: 141 mmol/L (ref 135–145)

## 2015-09-17 LAB — GLUCOSE, CAPILLARY
GLUCOSE-CAPILLARY: 138 mg/dL — AB (ref 65–99)
GLUCOSE-CAPILLARY: 127 mg/dL — AB (ref 65–99)
GLUCOSE-CAPILLARY: 89 mg/dL (ref 65–99)
GLUCOSE-CAPILLARY: 138 mg/dL — AB (ref 65–99)
Glucose-Capillary: 128 mg/dL — ABNORMAL HIGH (ref 65–99)

## 2015-09-17 LAB — MRSA PCR SCREENING: MRSA BY PCR: POSITIVE — AB

## 2015-09-17 MED ORDER — BUPROPION HCL ER (XL) 150 MG PO TB24
150.0000 mg | ORAL_TABLET | Freq: Every day | ORAL | Status: DC
  Administered 2015-09-17 – 2015-09-18 (×2): 150 mg via ORAL
  Filled 2015-09-17 (×2): qty 1

## 2015-09-17 MED ORDER — ACETAMINOPHEN 325 MG PO TABS
650.0000 mg | ORAL_TABLET | Freq: Once | ORAL | Status: AC
  Administered 2015-09-17: 650 mg via ORAL
  Filled 2015-09-17: qty 2

## 2015-09-17 MED ORDER — GLIPIZIDE ER 2.5 MG PO TB24
2.5000 mg | ORAL_TABLET | Freq: Every day | ORAL | Status: DC
  Administered 2015-09-18: 2.5 mg via ORAL
  Filled 2015-09-17: qty 1

## 2015-09-17 MED ORDER — ASPIRIN EC 325 MG PO TBEC
325.0000 mg | DELAYED_RELEASE_TABLET | Freq: Every day | ORAL | Status: DC
  Administered 2015-09-17 – 2015-09-18 (×2): 325 mg via ORAL
  Filled 2015-09-17 (×2): qty 1

## 2015-09-17 MED ORDER — CYCLOBENZAPRINE HCL 10 MG PO TABS: 10.0000 mg | ORAL_TABLET | Freq: Three times a day (TID) | ORAL | Status: DC | PRN

## 2015-09-17 MED ORDER — IPRATROPIUM-ALBUTEROL 0.5-2.5 (3) MG/3ML IN SOLN: 3.0000 mL | Freq: Three times a day (TID) | RESPIRATORY_TRACT | Status: DC

## 2015-09-17 MED ORDER — ALLOPURINOL 100 MG PO TABS
100.0000 mg | ORAL_TABLET | Freq: Two times a day (BID) | ORAL | Status: DC
  Administered 2015-09-17 – 2015-09-18 (×3): 100 mg via ORAL
  Filled 2015-09-17 (×3): qty 1

## 2015-09-17 MED ORDER — TRAMADOL HCL 50 MG PO TABS: 50.0000 mg | ORAL_TABLET | Freq: Four times a day (QID) | ORAL | Status: DC | PRN

## 2015-09-17 MED ORDER — IPRATROPIUM-ALBUTEROL 0.5-2.5 (3) MG/3ML IN SOLN: 3.0000 mL | RESPIRATORY_TRACT | Status: DC | PRN

## 2015-09-17 NOTE — Progress Notes (Signed)
PULMONARY / CRITICAL CARE MEDICINE   Name: Jacqueline Reese MRN: [id number] DOB: [birth year]    ADMISSION DATE:  09/13/2015  REFERRING MD:  Claudine Mouton (EDP)  CHIEF COMPLAINT:  Tongue swelling  SUBJECTIVE:  Feels weak.  Doesn't think she is ready to go home.  VITAL SIGNS: BP 120/63 mmHg  Pulse 80  Temp(Src) 97.9 F (36.6 C) (Oral)  Resp 18  Ht 5\' 6"  (1.676 m)  Wt 176 lb 8 oz (80.06 kg)  BMI 28.50 kg/m2  SpO2 96%  LMP 05/09/2010  INTAKE / OUTPUT: I/O last 3 completed shifts: In: 360 [P.O.:360] Out: 1175 [Urine:1175]   PHYSICAL EXAMINATION: General: alert Neuro: normal strength HEENT: no stridor Cardiac: regular Chest: no wheeze Abd: soft, non tender Ext: no edema Skin: no rashes  LABS:  BMET  Recent Labs Lab 09/15/15 0536 09/16/15 0258 09/17/15 0746  NA 141 142 141  K 3.8 4.1 2.8*  CL 106 103 104  CO2 28 27 28   BUN 10 11 13   CREATININE 0.47 0.45 0.48  GLUCOSE 128* 111* 112*    Electrolytes  Recent Labs Lab 09/14/15 0912 09/14/15 1812 09/15/15 0536 09/16/15 0258 09/17/15 0746  CALCIUM  --   --  9.1 9.2 9.2  MG 1.7 1.8 1.9  --   --   PHOS 3.1 3.2 3.5  --   --     CBC  Recent Labs Lab 09/13/15 0525 09/14/15 0430 09/16/15 0258  WBC 11.6* 10.3 11.6*  HGB 13.9 12.1 12.5  HCT 41.4 36.3 38.6  PLT 419* 368 415*    ABG  Recent Labs Lab 09/13/15 0846  PHART 7.326*  PCO2ART 42.4  PO2ART 93.0    Liver Enzymes  Recent Labs Lab 09/16/15 0258  AST 47*  ALT 65*  ALKPHOS 45  BILITOT 1.3*  ALBUMIN 3.0*    Glucose  Recent Labs Lab 09/15/15 1949 09/16/15 0830 09/16/15 1443 09/16/15 1747 09/16/15 2039 09/17/15 0739  GLUCAP 104* 113* 138* 154* 122* 127*    Imaging No results found.   LINES/TUBES: Nasal endotracheal tube 7/11 > 7/14  DISCUSSION: 60 yo with ACE inhibitor induced angioedema with airway compromise requiring nasal intubation.  ASSESSMENT / PLAN:  Acute respiratory failure with airway compromise from ACE  inhibitor induced angioedema >> resolved. - lisinopril in allergy list - bronchial hygiene   Hx of HTN. - norvasc, aldactone, ASA  Hx of DM. - continue metformin, glipizide  Hx of schizophrenia, depression. - klonopin, lexapro, neurontin, geodon  Deconditioning. - needs PT assessment prior to discharge  Updated pt's family at bedside.  Chesley Mires, MD [facility] Pulmonary/Critical Care 09/17/2015, 10:06 AM Pager:  [phone] After 3pm call: [phone]

## 2015-09-17 NOTE — Evaluation (Signed)
Physical Therapy Evaluation Patient Details Name: Jacqueline Reese MRN: [id number] DOB: [birth year] Today's Date: 09/17/2015   History of Present Illness  Patient admitted with Acute respiratory failure with airway compromise from ACE inhibitor induced angioedema.  PMH significant for schizoprenia and depression  Clinical Impression  Patient present with mild dependencies in gait and mobility, mostly due to generalized weakness.  Patient safe for supervised ambulation with RW, but unable to ambulate without RW.  Feel patient safe for discharge from PT standpoint when medically ready with supervision from family and RW.  I do recommend HHPT follow up to progress ambulation back to baseline.      Follow Up Recommendations Home health PT    Equipment Recommendations  Rolling walker with 5" wheels    Recommendations for Other Services       Precautions / Restrictions Precautions Precautions: Fall      Mobility  Bed Mobility Overal bed mobility: Modified Independent             General bed mobility comments: required use of railing and increased time  Transfers Overall transfer level: Modified independent Equipment used: Rolling walker (2 wheeled)             General transfer comment: required use of railing and increasd time and cueing for sit to stand  Ambulation/Gait Ambulation/Gait assistance: Min guard Ambulation Distance (Feet): 50 Feet Assistive device: Rolling walker (2 wheeled) Gait Pattern/deviations: Step-to pattern;Decreased stride length Gait velocity: greatly decreased Gait velocity interpretation: <1.8 ft/sec, indicative of risk for recurrent falls General Gait Details: cueing to increase gait speed and to increase step length; patient ambulated at extremely slow pace.  Stairs            Wheelchair Mobility    Modified Rankin (Stroke Patients Only)       Balance Overall balance assessment: Needs assistance Sitting-balance support: No  upper extremity supported Sitting balance-Leahy Scale: Fair     Standing balance support: Single extremity supported Standing balance-Leahy Scale: Poor Standing balance comment: dependent on RW for balance                             Pertinent Vitals/Pain Pain Assessment: No/denies pain    Home Living Family/patient expects to be discharged to:: Private residence Living Arrangements: Spouse/significant other;Children Available Help at Discharge: Family Type of Home: House Home Access: Stairs to enter Entrance Stairs-Rails: Right Entrance Stairs-Number of Steps: 2 Home Layout: One level Home Equipment: None      Prior Function Level of Independence: Independent               Hand Dominance        Extremity/Trunk Assessment   Upper Extremity Assessment: Overall WFL for tasks assessed           Lower Extremity Assessment: Overall WFL for tasks assessed      Cervical / Trunk Assessment: Normal  Communication   Communication: No difficulties  Cognition Arousal/Alertness: Awake/alert Behavior During Therapy: Flat affect Overall Cognitive Status: Within Functional Limits for tasks assessed                      General Comments      Exercises        Assessment/Plan    PT Assessment Patient needs continued PT services  PT Diagnosis Generalized weakness   PT Problem List Decreased activity tolerance;Decreased balance;Decreased mobility;Decreased knowledge of use of DME  PT Treatment  Interventions DME instruction;Gait training;Functional mobility training;Therapeutic activities;Therapeutic exercise;Balance training;Patient/family education   PT Goals (Current goals can be found in the Care Plan section) Acute Rehab PT Goals Patient Stated Goal: go home; get stronger PT Goal Formulation: With patient/family Time For Goal Achievement: 09/24/15 Potential to Achieve Goals: Good    Frequency Min 3X/week   Barriers to discharge         Co-evaluation               End of Session   Activity Tolerance: Patient tolerated treatment well Patient left: in bed;with bed alarm set;with call bell/phone within reach;with family/visitor present (sitting EOB)           Time: [id number] PT Time Calculation (min) (ACUTE ONLY): 36 min   Charges:   PT Evaluation $PT Eval Moderate Complexity: 1 Procedure PT Treatments $Gait Training: 8-22 mins   PT G CodesShanna Cisco 09/17/2015, 1:07 PM  09/17/2015 [name], [place]

## 2015-09-18 LAB — GLUCOSE, CAPILLARY
GLUCOSE-CAPILLARY: 103 mg/dL — AB (ref 65–99)
GLUCOSE-CAPILLARY: 121 mg/dL — AB (ref 65–99)

## 2015-09-18 MED ORDER — DIPHENHYDRAMINE HCL 25 MG PO CAPS: 25.0000 mg | ORAL_CAPSULE | Freq: Two times a day (BID) | ORAL | Status: DC

## 2015-09-18 MED ORDER — FAMOTIDINE 40 MG PO TABS: 40.0000 mg | ORAL_TABLET | Freq: Every day | ORAL | Status: DC

## 2015-09-18 MED ORDER — AMLODIPINE BESYLATE 5 MG PO TABS: 5.0000 mg | ORAL_TABLET | Freq: Every day | ORAL | Status: DC

## 2015-09-18 NOTE — Progress Notes (Signed)
Patient not voiding this shift. Bladder scan showed 937 cc. Paged MD.  Order put in for In and Out X 1.  I did In and out cathed her and she emptied 750CC of cloudy urine. Patient tolerated the procedure with no problem. NT [place] assisted.

## 2015-09-18 NOTE — Progress Notes (Signed)
Nsg Discharge Note  Admit Date:  09/13/2015 Discharge date: 09/18/2015   Breiona Leandro Reasoner to be D/C'd Home per MD order.  AVS completed.  Copy for chart, and copy for patient signed, and dated. Patient/caregiver able to verbalize understanding.  Discharge Medication:   Medication List    STOP taking these medications        meloxicam 7.5 MG tablet  Commonly known as:  MOBIC     PRILOSEC 20 MG capsule  Generic drug:  omeprazole      TAKE these medications        albuterol 108 (90 Base) MCG/ACT inhaler  Commonly known as:  PROVENTIL HFA;VENTOLIN HFA  Inhale 2 puffs into the lungs every 4 (four) hours as needed for wheezing or shortness of breath.     allopurinol 100 MG tablet  Commonly known as:  ZYLOPRIM  Take 100 mg by mouth 2 (two) times daily.     amLODipine 5 MG tablet  Commonly known as:  NORVASC  Take 1 tablet (5 mg total) by mouth daily.     aspirin EC 325 MG tablet  Take 325 mg by mouth daily.     buPROPion 150 MG 24 hr tablet  Commonly known as:  WELLBUTRIN XL  Take 150 mg by mouth daily.     clonazePAM 1 MG tablet  Commonly known as:  KLONOPIN  Take 1 mg by mouth 2 (two) times daily.     cyclobenzaprine 10 MG tablet  Commonly known as:  FLEXERIL  Take 10 mg by mouth 3 (three) times daily as needed for muscle spasms.     diphenhydrAMINE 25 mg capsule  Commonly known as:  BENADRYL  Take 1 capsule (25 mg total) by mouth 2 (two) times daily.     famotidine 40 MG tablet  Commonly known as:  PEPCID  Take 1 tablet (40 mg total) by mouth daily.     gabapentin 100 MG capsule  Commonly known as:  NEURONTIN  Take 100 mg by mouth 3 (three) times daily.     glipiZIDE 2.5 MG 24 hr tablet  Commonly known as:  GLUCOTROL XL  Take 2.5 mg by mouth daily.     LEXAPRO 20 MG tablet  Generic drug:  escitalopram  Take 20 mg by mouth daily.     metFORMIN 500 MG tablet  Commonly known as:  GLUCOPHAGE  Take 500 mg by mouth 2 (two) times daily with a meal.     spironolactone 50 MG tablet  Commonly known as:  ALDACTONE  Take 50 mg by mouth daily.     traMADol 50 MG tablet  Commonly known as:  ULTRAM  Take 50 mg by mouth every 6 (six) hours as needed.     ziprasidone 80 MG capsule  Commonly known as:  GEODON  Take 240 mg by mouth every evening.        Discharge Assessment: Filed Vitals:   09/18/15 0456 09/18/15 1312  BP: 110/53 114/75  Pulse: 76 110  Temp: 99.5 F (37.5 C) 100.4 F (38 C)  Resp: 18 18   Skin clean, dry and intact without evidence of skin break down, no evidence of skin tears noted. IV catheter discontinued intact. Site without signs and symptoms of complications - no redness or edema noted at insertion site, patient denies c/o pain - only slight tenderness at site.  Dressing with slight pressure applied.  D/c Instructions-Education: Discharge instructions given to patient/family with verbalized understanding. D/c education completed with patient/family  including follow up instructions, medication list, d/c activities limitations if indicated, with other d/c instructions as indicated by MD - patient able to verbalize understanding, all questions fully answered. Patient instructed to return to ED, call 911, or call MD for any changes in condition.  Patient escorted via [place], and D/C home via private auto.  Salley Slaughter, RN 09/18/2015 2:37 PM

## 2015-09-28 ENCOUNTER — Ambulatory Visit: Payer: Medicare HMO | Admitting: Podiatry

## 2015-10-18 ENCOUNTER — Telehealth: Payer: Self-pay | Admitting: *Deleted

## 2015-10-18 NOTE — Telephone Encounter (Signed)
Jacqueline Reese - Start states pt has a blister on left plantar 1st MPJ and between the 2nd toe, has been treating with Neosporin.  Jacqueline Reese states the area needed to be dry, so had pt stop the Neosporin and would like to get her in to see Dr. Amalia Hailey sooner than the end of August, for treatment and new Diabetic Shoes, since pt is more active and wearing improper shoes.

## 2015-10-26 ENCOUNTER — Ambulatory Visit: Payer: Medicare HMO | Admitting: Podiatry

## 2015-11-08 ENCOUNTER — Ambulatory Visit: Payer: Medicare HMO | Admitting: Podiatry

## 2016-02-05 ENCOUNTER — Emergency Department (HOSPITAL_COMMUNITY)
Admission: EM | Admit: 2016-02-05 | Discharge: 2016-02-05 | Disposition: A | Discharge status: Home / Self Care | Payer: Medicare HMO | Attending: Emergency Medicine | Admitting: Emergency Medicine

## 2016-02-05 ENCOUNTER — Encounter (HOSPITAL_COMMUNITY): Payer: Self-pay | Admitting: *Deleted

## 2016-02-05 DIAGNOSIS — Z7982 Long term (current) use of aspirin: Secondary | ICD-10-CM | POA: Diagnosis not present

## 2016-02-05 DIAGNOSIS — F17210 Nicotine dependence, cigarettes, uncomplicated: Secondary | ICD-10-CM | POA: Diagnosis not present

## 2016-02-05 DIAGNOSIS — Z79899 Other long term (current) drug therapy: Secondary | ICD-10-CM | POA: Diagnosis not present

## 2016-02-05 DIAGNOSIS — M1711 Unilateral primary osteoarthritis, right knee: Secondary | ICD-10-CM | POA: Insufficient documentation

## 2016-02-05 DIAGNOSIS — D234 Other benign neoplasm of skin of scalp and neck: Secondary | ICD-10-CM | POA: Diagnosis not present

## 2016-02-05 DIAGNOSIS — R51 Headache: Secondary | ICD-10-CM | POA: Diagnosis present

## 2016-02-05 DIAGNOSIS — E119 Type 2 diabetes mellitus without complications: Secondary | ICD-10-CM | POA: Insufficient documentation

## 2016-02-05 DIAGNOSIS — Z794 Long term (current) use of insulin: Secondary | ICD-10-CM | POA: Diagnosis not present

## 2016-02-05 DIAGNOSIS — I10 Essential (primary) hypertension: Secondary | ICD-10-CM | POA: Diagnosis not present

## 2016-02-05 DIAGNOSIS — M1990 Unspecified osteoarthritis, unspecified site: Secondary | ICD-10-CM

## 2016-02-05 HISTORY — DX: Unspecified osteoarthritis, unspecified site: M19.90

## 2016-02-05 MED ORDER — ACETAMINOPHEN 325 MG PO TABS
650.0000 mg | ORAL_TABLET | Freq: Once | ORAL | Status: AC
  Administered 2016-02-05: 650 mg via ORAL
  Filled 2016-02-05: qty 2

## 2016-02-05 MED ORDER — BACITRACIN ZINC 500 UNIT/GM EX OINT: 1.0000 "application " | TOPICAL_OINTMENT | Freq: Two times a day (BID) | CUTANEOUS | 1 refills | Status: DC

## 2016-02-05 MED ORDER — DICLOFENAC SODIUM 3 % TD GEL: 1.0000 "application " | Freq: Two times a day (BID) | TRANSDERMAL | 0 refills | Status: DC

## 2016-02-05 NOTE — ED Provider Notes (Signed)
Medical screening examination/treatment/procedure(s) were conducted as a shared visit with non-physician practitioner(s) and myself.  I personally evaluated the patient during the encounter.   EKG Interpretation None      The patient seen by me along with the physician assistant. Patient with 2 complaints but I was asked to evaluate the patient for this scalp cyst on the back of her head more on the right side. Area about 3 cm of swelling by 5 mm area of skin lesion. No significant fluctuance not particular tender. Suggestive of a chronic cyst with a small area of acute infection. Do not recommend incision and drainage since no acute abscess cavity. Recommend treatment with antibiotics and topical antibiotics. Patient has follow-up with her regular doctor in the next 2 weeks. Patient will return if things are worse.  Patient nontoxic no acute distress.   Fredia Sorrow, MD 02/05/16 [phone]

## 2016-02-05 NOTE — ED Provider Notes (Signed)
[facility] DEPT Provider Note   CSN: [id number] Arrival date & time: 02/05/16  0754     History   Chief Complaint Chief Complaint  Patient presents with  . Leg Pain  . Abscess    HPI Jacqueline Reese is a 60 y.o. female.  Jacqueline Reese is a 60 y.o. Female who presents to the ED complaining of an abscess to her right posterior head and pain from her arthritis. Patient reports over the past several days she's noted is a small area to her right posterior scalp that has had some slight drainage. Is slightly tender to palpation. She reports has been a larger area that normally is enlarged but does not cause her any pain. Patient also complains of arthritis pain. She tells me she has pain in her right knee and leg from arthritis. It has been ongoing for many years. She tells me she usually takes Tylenol but this does not seem to be helping. She denies any injury or trauma to her knee or leg. She denies any leg pain or swelling. She denies fevers, cough, weakness, leg swelling, sore throat, chest pain or shortness of breath.    The history is provided by the patient. No language interpreter was used.  Leg Pain    Abscess  Associated symptoms: no fever     Past Medical History:  Diagnosis Date  . Arthritis   . Chronic back pain   . Depression   . Diabetes mellitus without complication ([place])   . Gout   . Hypertension   . Schizophrenic disorder [facility])     Patient Active Problem List   Diagnosis Date Noted  . Angioedema 09/13/2015  . Anaphylaxis   . Hypertension, essential   . Acute hypoxemic respiratory failure ([place])   . Type 2 diabetes mellitus without complication, without long-term current use of insulin ([place])   . DIAB W/O MENTION COMP TYPE II/UNS TYPE UNCNTRL 01/19/2010  . FOOT ULCER, RIGHT 01/19/2010  . MENOPAUSE-RELATED VASOMOTOR SYMPTOMS, HOT FLASHES 10/04/2009  . GOUT, UNSPECIFIED 06/07/2009  . LICHEN SIMPLEX CHRONICUS 11/30/2008  . OTITIS EXTERNA,  CHRONIC 04/16/2008  . OBESITY, NOS 05/02/2006  . SCHIZOPHRENIA 05/02/2006  . HYPERTENSION, BENIGN SYSTEMIC 05/02/2006  . GASTROESOPHAGEAL REFLUX, NO ESOPHAGITIS 05/02/2006    Past Surgical History:  Procedure Laterality Date  . CESAREAN SECTION     x2  . NASAL ENDOSCOPY Left 09/13/2015   Procedure: NASAL ENDOSCOPY WITH NASAL INTABATION;  Surgeon: Jerrell Belfast, MD;  Location: [facility];  Service: ENT;  Laterality: Left;    OB History    No data available       Home Medications    Prior to Admission medications   Medication Sig Start Date End Date Taking? Authorizing Provider  albuterol (PROVENTIL HFA;VENTOLIN HFA) 108 (90 BASE) MCG/ACT inhaler Inhale 2 puffs into the lungs every 4 (four) hours as needed for wheezing or shortness of breath. 05/09/13   Janne Napoleon, NP  allopurinol (ZYLOPRIM) 100 MG tablet Take 100 mg by mouth 2 (two) times daily.     Historical Provider, MD  amLODipine (NORVASC) 5 MG tablet Take 1 tablet (5 mg total) by mouth daily. 09/18/15   Marijean Heath, NP  aspirin EC 325 MG tablet Take 325 mg by mouth daily.    Historical Provider, MD  bacitracin ointment Apply 1 application topically 2 (two) times daily. 02/05/16   Waynetta Pean, PA-C  buPROPion (WELLBUTRIN XL) 150 MG 24 hr tablet Take 150 mg by mouth daily.  Historical Provider, MD  clonazePAM (KLONOPIN) 1 MG tablet Take 1 mg by mouth 2 (two) times daily.     Historical Provider, MD  cyclobenzaprine (FLEXERIL) 10 MG tablet Take 10 mg by mouth 3 (three) times daily as needed for muscle spasms.    Historical Provider, MD  Diclofenac Sodium 3 % GEL Place 1 application onto the skin 2 (two) times daily. To affected area. 02/05/16   Waynetta Pean, PA-C  diphenhydrAMINE (BENADRYL) 25 mg capsule Take 1 capsule (25 mg total) by mouth 2 (two) times daily. 09/18/15 09/21/15  Marijean Heath, NP  escitalopram (LEXAPRO) 20 MG tablet Take 20 mg by mouth daily.     Historical Provider, MD  famotidine (PEPCID) 40 MG  tablet Take 1 tablet (40 mg total) by mouth daily. 09/18/15   Marijean Heath, NP  gabapentin (NEURONTIN) 100 MG capsule Take 100 mg by mouth 3 (three) times daily.     Historical Provider, MD  glipiZIDE (GLUCOTROL XL) 2.5 MG 24 hr tablet Take 2.5 mg by mouth daily.    Historical Provider, MD  metFORMIN (GLUCOPHAGE) 500 MG tablet Take 500 mg by mouth 2 (two) times daily with a meal.    Historical Provider, MD  spironolactone (ALDACTONE) 50 MG tablet Take 50 mg by mouth daily.    Historical Provider, MD  traMADol (ULTRAM) 50 MG tablet Take 50 mg by mouth every 6 (six) hours as needed.    Historical Provider, MD  ziprasidone (GEODON) 80 MG capsule Take 240 mg by mouth every evening. 07/13/15   Historical Provider, MD    Family History History reviewed. No pertinent family history.  Social History Social History  Substance Use Topics  . Smoking status: Current Every Day Smoker    Packs/day: 0.33    Types: Cigarettes  . Smokeless tobacco: Never Used  . Alcohol use No     Allergies   Lisinopril; Chantix [varenicline]; Doxycycline; Ibuprofen; Lamisil [terbinafine hcl]; Prednisone; Cephalexin; and Sulfa antibiotics   Review of Systems Review of Systems  Constitutional: Negative for chills and fever.  HENT: Negative for ear pain and sore throat.   Eyes: Negative for pain.  Respiratory: Negative for shortness of breath.   Cardiovascular: Negative for chest pain and leg swelling.  Musculoskeletal: Positive for arthralgias.  Skin: Positive for rash.     Physical Exam Updated Vital Signs BP 122/77 (BP Location: Right Arm)   Pulse 88   Temp 97.8 F (36.6 C) (Oral)   Resp 16   Ht 5\' 2"  (1.575 m)   Wt 64.9 kg   LMP 05/09/2010   SpO2 100%   BMI 26.16 kg/m   Physical Exam  Constitutional: She appears well-developed and well-nourished. No distress.  Nontoxic appearing.  HENT:  Head: Normocephalic and atraumatic.  Right Ear: External ear normal.  Left Ear: External ear  normal.  Moderate size cystic-like mass noted to her right posterior scalp. It is soft and not fluctuant. There is some slight area of inflammation to the superior portion of the cyst. No drainage. No fluctuance. No surrounding erythema.   Eyes: Right eye exhibits no discharge. Left eye exhibits no discharge.  Neck: Neck supple.  Cardiovascular: Normal rate, regular rhythm and intact distal pulses.   Pulmonary/Chest: Effort normal. No respiratory distress.  Musculoskeletal: Normal range of motion. She exhibits no edema, tenderness or deformity.  No LE edema or TTP. Ambulates with cane. No calf edema or tenderness. No erythema.   Lymphadenopathy:    She has no cervical  adenopathy.  Neurological: She is alert. Coordination normal.  Sensation is intact in her bilateral lower extremities.  Skin: Skin is warm and dry. Capillary refill takes less than 2 seconds. No rash noted. She is not diaphoretic. No erythema. No pallor.  Psychiatric: She has a normal mood and affect. Her behavior is normal.  Nursing note and vitals reviewed.    ED Treatments / Results  Labs (all labs ordered are listed, but only abnormal results are displayed) Labs Reviewed - No data to display  EKG  EKG Interpretation None       Radiology No results found.  Procedures Procedures (including critical care time)  Medications Ordered in ED Medications - No data to display   Initial Impression / Assessment and Plan / ED Course  I have reviewed the triage vital signs and the nursing notes.  Pertinent labs & imaging results that were available during my care of the patient were reviewed by me and considered in my medical decision making (see chart for details).  Clinical Course    This is a 60 y.o. Female who presents to the ED complaining of an abscess to her right posterior head and pain from her arthritis. Patient reports over the past several days she's noted is a small area to her right posterior scalp  that has had some slight drainage. Is slightly tender to palpation. She reports has been a larger area that normally is enlarged but does not cause her any pain. Patient also complains of arthritis pain. She tells me she has pain in her right knee and leg from arthritis. It has been ongoing for many years. She tells me she usually takes Tylenol but this does not seem to be helping. She denies any injury or trauma to her knee or leg. She denies any leg pain or swelling. On exam the patient is afebrile nontoxic appearing. She is a chronic-appearing cystic-like lesion to the right posterior scalp. There is an area of inflammation to the superior portion of it. No fluctuance. No surrounding erythema.  Dr. Rogene Houston also evaluated the cyst and feels this is chronic and would not I&D today. She may need I&D by Surgery or dermatology. Will do warm compresses and bacitracin. She has extensive abx allergies and will hold on oral abx therapy at this time.  Patient also with complaints of her chronic arthritis. She reports Tylenol has not been helping. Will do a short course of diclofenac gel and encouraged her to continue to take Tylenol as well for her pain. I advised the patient to follow-up with their primary care provider this week. I advised the patient to return to the emergency department with new or worsening symptoms or new concerns. The patient verbalized understanding and agreement with plan.    This patient was discussed with and evaluated by Dr. Rogene Houston who agrees with assessment and plan.   Final Clinical Impressions(s) / ED Diagnoses   Final diagnoses:  Dermoid cyst of scalp  Arthritis    New Prescriptions New Prescriptions   BACITRACIN OINTMENT    Apply 1 application topically 2 (two) times daily.   DICLOFENAC SODIUM 3 % GEL    Place 1 application onto the skin 2 (two) times daily. To affected area.     Waynetta Pean, PA-C 02/05/16 0848    Fredia Sorrow, MD 02/08/16 [phone]

## 2016-02-05 NOTE — ED Triage Notes (Signed)
PT reports RT knee and leg pain with a Hx of AR. Pt also reports a scalp abscess.

## 2016-04-13 ENCOUNTER — Encounter (HOSPITAL_COMMUNITY): Payer: Self-pay

## 2016-04-13 ENCOUNTER — Ambulatory Visit (HOSPITAL_COMMUNITY)
Admission: EM | Admit: 2016-04-13 | Discharge: 2016-04-13 | Disposition: A | Discharge status: Home / Self Care | Payer: Medicare HMO | Attending: Family Medicine | Admitting: Family Medicine

## 2016-04-13 DIAGNOSIS — M79605 Pain in left leg: Secondary | ICD-10-CM | POA: Diagnosis not present

## 2016-04-13 MED ORDER — GABAPENTIN 100 MG PO CAPS: 100.0000 mg | ORAL_CAPSULE | Freq: Three times a day (TID) | ORAL | 0 refills | Status: DC

## 2016-04-13 MED ORDER — TRAMADOL HCL 50 MG PO TABS: 50.0000 mg | ORAL_TABLET | Freq: Four times a day (QID) | ORAL | 0 refills | Status: DC | PRN

## 2016-04-13 NOTE — Discharge Instructions (Signed)
See your primary care doctor next week if not improving significantly with the medicine prescribed

## 2016-04-13 NOTE — ED Provider Notes (Addendum)
[name]    CSN: [id number] Arrival date & time: 04/13/16  1517     History   Chief Complaint Chief Complaint  Patient presents with  . Leg Pain    HPI Jacqueline Reese is a 61 y.o. female.   This is a 61 year old woman who comes to the urgent care center complaining of leg pain, spontaneous onset 2 weeks ago. According to the patient, she saw her primary care doctor who diagnosed arthritis. She says that her doctor gave her new medicine to control the pain.  She says the pain starts in the hip area on the side and radiates down the side of the thigh. It is worse when weightbearing.      Past Medical History:  Diagnosis Date  . Arthritis   . Chronic back pain   . Depression   . Diabetes mellitus without complication ([place])   . Gout   . Hypertension   . Schizophrenic disorder [facility])     Patient Active Problem List   Diagnosis Date Noted  . Angioedema 09/13/2015  . Anaphylaxis   . Hypertension, essential   . Acute hypoxemic respiratory failure ([place])   . Type 2 diabetes mellitus without complication, without long-term current use of insulin ([place])   . DIAB W/O MENTION COMP TYPE II/UNS TYPE UNCNTRL 01/19/2010  . FOOT ULCER, RIGHT 01/19/2010  . MENOPAUSE-RELATED VASOMOTOR SYMPTOMS, HOT FLASHES 10/04/2009  . GOUT, UNSPECIFIED 06/07/2009  . LICHEN SIMPLEX CHRONICUS 11/30/2008  . OTITIS EXTERNA, CHRONIC 04/16/2008  . OBESITY, NOS 05/02/2006  . SCHIZOPHRENIA 05/02/2006  . HYPERTENSION, BENIGN SYSTEMIC 05/02/2006  . GASTROESOPHAGEAL REFLUX, NO ESOPHAGITIS 05/02/2006    Past Surgical History:  Procedure Laterality Date  . CESAREAN SECTION     x2  . NASAL ENDOSCOPY Left 09/13/2015   Procedure: NASAL ENDOSCOPY WITH NASAL INTABATION;  Surgeon: Jerrell Belfast, MD;  Location: [place];  Service: ENT;  Laterality: Left;    OB History    No data available       Home Medications    Prior to Admission medications   Medication Sig Start Date End Date  Taking? Authorizing Provider  amLODipine (NORVASC) 5 MG tablet Take 1 tablet (5 mg total) by mouth daily. 09/18/15  Yes Marijean Heath, NP  aspirin EC 325 MG tablet Take 325 mg by mouth daily.   Yes Historical Provider, MD  bacitracin ointment Apply 1 application topically 2 (two) times daily. 02/05/16  Yes Waynetta Pean, PA-C  clonazePAM (KLONOPIN) 1 MG tablet Take 1 mg by mouth 2 (two) times daily.    Yes Historical Provider, MD  cyclobenzaprine (FLEXERIL) 10 MG tablet Take 10 mg by mouth 3 (three) times daily as needed for muscle spasms.   Yes Historical Provider, MD  escitalopram (LEXAPRO) 20 MG tablet Take 20 mg by mouth daily.    Yes Historical Provider, MD  famotidine (PEPCID) 40 MG tablet Take 1 tablet (40 mg total) by mouth daily. 09/18/15  Yes Marijean Heath, NP  glipiZIDE (GLUCOTROL XL) 2.5 MG 24 hr tablet Take 2.5 mg by mouth daily.   Yes Historical Provider, MD  metFORMIN (GLUCOPHAGE) 500 MG tablet Take 500 mg by mouth 2 (two) times daily with a meal.   Yes Historical Provider, MD  spironolactone (ALDACTONE) 50 MG tablet Take 50 mg by mouth daily.   Yes Historical Provider, MD  ziprasidone (GEODON) 80 MG capsule Take 240 mg by mouth every evening. 07/13/15  Yes Historical Provider, MD  albuterol (PROVENTIL HFA;VENTOLIN HFA)  108 (90 BASE) MCG/ACT inhaler Inhale 2 puffs into the lungs every 4 (four) hours as needed for wheezing or shortness of breath. 05/09/13   Janne Napoleon, NP  allopurinol (ZYLOPRIM) 100 MG tablet Take 100 mg by mouth 2 (two) times daily.     Historical Provider, MD  buPROPion (WELLBUTRIN XL) 150 MG 24 hr tablet Take 150 mg by mouth daily.    Historical Provider, MD  Diclofenac Sodium 3 % GEL Place 1 application onto the skin 2 (two) times daily. To affected area. 02/05/16   Waynetta Pean, PA-C  diphenhydrAMINE (BENADRYL) 25 mg capsule Take 1 capsule (25 mg total) by mouth 2 (two) times daily. 09/18/15 09/21/15  Marijean Heath, NP  gabapentin (NEURONTIN) 100 MG  capsule Take 1 capsule (100 mg total) by mouth 3 (three) times daily. 04/13/16   Robyn Haber, MD  traMADol (ULTRAM) 50 MG tablet Take 1 tablet (50 mg total) by mouth every 6 (six) hours as needed. 04/13/16   Robyn Haber, MD    Family History No family history on file.  Social History Social History  Substance Use Topics  . Smoking status: Current Every Day Smoker    Packs/day: 0.33    Types: Cigarettes  . Smokeless tobacco: Never Used  . Alcohol use No     Allergies   Lisinopril; Chantix [varenicline]; Doxycycline; Ibuprofen; Lamisil [terbinafine hcl]; Prednisone; Cephalexin; and Sulfa antibiotics   Review of Systems Review of Systems  Constitutional: Negative.   HENT: Negative.   Respiratory: Negative.   Musculoskeletal: Positive for arthralgias and gait problem.     Physical Exam Triage Vital Signs ED Triage Vitals  Enc Vitals Group     BP 04/13/16 1550 119/75     Pulse Rate 04/13/16 1550 96     Resp 04/13/16 1550 20     Temp 04/13/16 1550 98.1 F (36.7 C)     Temp Source 04/13/16 1550 Oral     SpO2 04/13/16 1550 99 %     Weight --      Height --      Head Circumference --      Peak Flow --      Pain Score 04/13/16 1553 7     Pain Loc --      Pain Edu? --      Excl. in [place]? --    No data found.   Updated Vital Signs BP 119/75 (BP Location: Left Arm)   Pulse 96   Temp 98.1 F (36.7 C) (Oral)   Resp 20   LMP 05/09/2010   SpO2 99%    Physical Exam  Constitutional: She is oriented to person, place, and time. She appears well-developed and well-nourished.  HENT:  Right Ear: External ear normal.  Left Ear: External ear normal.  Mouth/Throat: Oropharynx is clear and moist.  Eyes: Conjunctivae and EOM are normal. Pupils are equal, round, and reactive to light.  Neck: Normal range of motion. Neck supple.  Pulmonary/Chest: Effort normal.  Musculoskeletal: She exhibits tenderness. She exhibits no edema or deformity.  Patient is mildly tender over  the greater trochanter.  Neurological: She is alert and oriented to person, place, and time.  Skin: Skin is warm and dry.  Nursing note and vitals reviewed.    UC Treatments / Results  Labs (all labs ordered are listed, but only abnormal results are displayed) Labs Reviewed - No data to display  EKG  EKG Interpretation None       Radiology No results found.  Procedures Procedures (including critical care time)  Medications Ordered in UC Medications - No data to display   Initial Impression / Assessment and Plan / UC Course  I have reviewed the triage vital signs and the nursing notes.  Pertinent labs & imaging results that were available during my care of the patient were reviewed by me and considered in my medical decision making (see chart for details).     Final Clinical Impressions(s) / UC Diagnoses   Final diagnoses:  Left leg pain    New Prescriptions New Prescriptions   GABAPENTIN (NEURONTIN) 100 MG CAPSULE    Take 1 capsule (100 mg total) by mouth 3 (three) times daily.   TRAMADOL (ULTRAM) 50 MG TABLET    Take 1 tablet (50 mg total) by mouth every 6 (six) hours as needed.     Robyn Haber, MD 04/13/16 [id number]    Robyn Haber, MD 04/13/16 [phone]

## 2016-04-13 NOTE — ED Triage Notes (Signed)
Pt said her right leg hurts all the time. No injury to it, has been taking tylenol for the pain.

## 2016-04-26 ENCOUNTER — Other Ambulatory Visit: Payer: Self-pay | Admitting: Internal Medicine

## 2016-04-26 DIAGNOSIS — Z1231 Encounter for screening mammogram for malignant neoplasm of breast: Secondary | ICD-10-CM

## 2016-05-05 ENCOUNTER — Ambulatory Visit (HOSPITAL_COMMUNITY)
Admission: EM | Admit: 2016-05-05 | Discharge: 2016-05-05 | Disposition: A | Discharge status: Home / Self Care | Payer: Medicare HMO | Attending: Internal Medicine | Admitting: Internal Medicine

## 2016-05-05 ENCOUNTER — Encounter (HOSPITAL_COMMUNITY): Payer: Self-pay | Admitting: Family Medicine

## 2016-05-05 DIAGNOSIS — M79605 Pain in left leg: Secondary | ICD-10-CM

## 2016-05-05 DIAGNOSIS — M79604 Pain in right leg: Secondary | ICD-10-CM | POA: Diagnosis not present

## 2016-05-05 DIAGNOSIS — M791 Myalgia, unspecified site: Secondary | ICD-10-CM

## 2016-05-05 MED ORDER — HYDROCODONE-ACETAMINOPHEN 5-325 MG PO TABS: 1.0000 | ORAL_TABLET | ORAL | 0 refills | Status: DC | PRN

## 2016-05-05 NOTE — Discharge Instructions (Signed)
Follow-up with your primary care doctor for treatment of your chronic leg pain. You will be given just a few hydrocodone tablets as requested however we cannot continue to treat chronic pain with narcotics from the urgent care. He must receive these from your doctor and not the urgent care.

## 2016-05-05 NOTE — ED Triage Notes (Signed)
Pt here for chronic right leg pain worse ing x 2 weeks. sts worse at night.

## 2016-05-05 NOTE — ED Provider Notes (Signed)
CSN: [id number]     Arrival date & time 05/05/16  1205 History   First MD Initiated Contact with Patient 05/05/16 1227     Chief Complaint  Patient presents with  . Leg Pain   (Consider location/radiation/quality/duration/timing/severity/associated sxs/prior Treatment) 61 year old female presents to the urgent care with muscle pains of both legs. This is acute on chronic condition. She states that she has been seen by her PCP and urgent care for this in the past. His also complaining of right hip pain where she has been diagnosed with severe osteoarthrosis. She complains of pain in the muscles. She states this pain is similar, identical to the pain for which she has been having for a couple of years.      Past Medical History:  Diagnosis Date  . Arthritis   . Chronic back pain   . Depression   . Diabetes mellitus without complication ([place])   . Gout   . Hypertension   . Schizophrenic disorder [facility])    Past Surgical History:  Procedure Laterality Date  . CESAREAN SECTION     x2  . NASAL ENDOSCOPY Left 09/13/2015   Procedure: NASAL ENDOSCOPY WITH NASAL INTABATION;  Surgeon: Jerrell Belfast, MD;  Location: [place];  Service: ENT;  Laterality: Left;   History reviewed. No pertinent family history. Social History  Substance Use Topics  . Smoking status: Current Every Day Smoker    Packs/day: 0.33    Types: Cigarettes  . Smokeless tobacco: Never Used  . Alcohol use No   OB History    No data available     Review of Systems  Constitutional: Positive for activity change. Negative for fatigue and fever.  HENT: Negative.   Respiratory: Negative.   Cardiovascular: Negative.   Musculoskeletal: Positive for arthralgias and myalgias.  Skin: Negative.   Neurological: Negative.  Negative for numbness.  All other systems reviewed and are negative.   Allergies  Lisinopril; Chantix [varenicline]; Doxycycline; Ibuprofen; Lamisil [terbinafine hcl]; Prednisone; Cephalexin; and Sulfa  antibiotics  Home Medications   Prior to Admission medications   Medication Sig Start Date End Date Taking? Authorizing Provider  albuterol (PROVENTIL HFA;VENTOLIN HFA) 108 (90 BASE) MCG/ACT inhaler Inhale 2 puffs into the lungs every 4 (four) hours as needed for wheezing or shortness of breath. 05/09/13   Janne Napoleon, NP  allopurinol (ZYLOPRIM) 100 MG tablet Take 100 mg by mouth 2 (two) times daily.     Historical Provider, MD  amLODipine (NORVASC) 5 MG tablet Take 1 tablet (5 mg total) by mouth daily. 09/18/15   Marijean Heath, NP  aspirin EC 325 MG tablet Take 325 mg by mouth daily.    Historical Provider, MD  bacitracin ointment Apply 1 application topically 2 (two) times daily. 02/05/16   Waynetta Pean, PA-C  buPROPion (WELLBUTRIN XL) 150 MG 24 hr tablet Take 150 mg by mouth daily.    Historical Provider, MD  clonazePAM (KLONOPIN) 1 MG tablet Take 1 mg by mouth 2 (two) times daily.     Historical Provider, MD  cyclobenzaprine (FLEXERIL) 10 MG tablet Take 10 mg by mouth 3 (three) times daily as needed for muscle spasms.    Historical Provider, MD  Diclofenac Sodium 3 % GEL Place 1 application onto the skin 2 (two) times daily. To affected area. 02/05/16   Waynetta Pean, PA-C  diphenhydrAMINE (BENADRYL) 25 mg capsule Take 1 capsule (25 mg total) by mouth 2 (two) times daily. 09/18/15 09/21/15  Marijean Heath, NP  escitalopram [place]ous[place])  20 MG tablet Take 20 mg by mouth daily.     Historical Provider, MD  famotidine (PEPCID) 40 MG tablet Take 1 tablet (40 mg total) by mouth daily. 09/18/15   Marijean Heath, NP  gabapentin (NEURONTIN) 100 MG capsule Take 1 capsule (100 mg total) by mouth 3 (three) times daily. 04/13/16   Robyn Haber, MD  glipiZIDE (GLUCOTROL XL) 2.5 MG 24 hr tablet Take 2.5 mg by mouth daily.    Historical Provider, MD  HYDROcodone-acetaminophen (NORCO/VICODIN) 5-325 MG tablet Take 1 tablet by mouth every 4 (four) hours as needed. 05/05/16   Janne Napoleon, NP  metFORMIN  (GLUCOPHAGE) 500 MG tablet Take 500 mg by mouth 2 (two) times daily with a meal.    Historical Provider, MD  spironolactone (ALDACTONE) 50 MG tablet Take 50 mg by mouth daily.    Historical Provider, MD  traMADol (ULTRAM) 50 MG tablet Take 1 tablet (50 mg total) by mouth every 6 (six) hours as needed. 04/13/16   Robyn Haber, MD  ziprasidone (GEODON) 80 MG capsule Take 240 mg by mouth every evening. 07/13/15   Historical Provider, MD   Meds Ordered and Administered this Visit  Medications - No data to display  BP 119/87   Pulse 91   Temp 98.3 F (36.8 C)   Resp 18   LMP 05/09/2010   SpO2 100%  No data found.   Physical Exam  Constitutional: She is oriented to person, place, and time. She appears well-developed and well-nourished. No distress.  Neck: Normal range of motion. Neck supple.  Cardiovascular: Normal rate.   Pulmonary/Chest: Effort normal.  Musculoskeletal: She exhibits no edema or deformity.  Tenderness to the right lateral hip and proximal femur musculature. Minor tenderness to the calf muscle. There is no swelling no tension no discoloration no overlying skin changes. Do not suspect DVT.  Neurological: She is alert and oriented to person, place, and time.  Skin: Skin is warm and dry. No rash noted. No erythema.  Psychiatric: She has a normal mood and affect.  Nursing note and vitals reviewed.   Urgent Care Course     Procedures (including critical care time)  Labs Review Labs Reviewed - No data to display  Imaging Review No results found.   Visual Acuity Review  Right Eye Distance:   Left Eye Distance:   Bilateral Distance:    Right Eye Near:   Left Eye Near:    Bilateral Near:         MDM   1. Myalgia   2. Bilateral leg pain    After a lengthy search of the charts and visits for chronic pain turns out the patient is not requesting hydrocortisone as she said that trying to pronounce hydrocodone. She is asking for at least 20 tablets. She is  advised that she will receive 12 tablets but we cannot continue to refill her controlled pain medication from the urgent care and must follow-up with her primary care provider for this medication. Follow-up with your primary care doctor for treatment of your chronic leg pain. You will be given just a few hydrocodone tablets as requested however we cannot continue to treat chronic pain with narcotics from the urgent care. He must receive these from your doctor and not the urgent care. Meds ordered this encounter  Medications  . HYDROcodone-acetaminophen (NORCO/VICODIN) 5-325 MG tablet    Sig: Take 1 tablet by mouth every 4 (four) hours as needed.    Dispense:  12 tablet  Refill:  0    Order Specific Question:   Supervising Provider    Answer:   Sherlene Shams [id number]      Janne Napoleon, NP 05/05/16 1258

## 2016-06-04 ENCOUNTER — Ambulatory Visit
Admission: RE | Admit: 2016-06-04 | Discharge: 2016-06-04 | Disposition: A | Discharge status: Home / Self Care | Payer: Medicare HMO | Source: Ambulatory Visit | Attending: Internal Medicine | Admitting: Internal Medicine

## 2016-06-04 ENCOUNTER — Ambulatory Visit: Payer: Medicare HMO

## 2016-06-04 DIAGNOSIS — Z1231 Encounter for screening mammogram for malignant neoplasm of breast: Secondary | ICD-10-CM

## 2016-07-10 ENCOUNTER — Emergency Department (HOSPITAL_COMMUNITY)
Admission: EM | Admit: 2016-07-10 | Discharge: 2016-07-10 | Disposition: A | Discharge status: Home / Self Care | Payer: Medicare HMO | Attending: Emergency Medicine | Admitting: Emergency Medicine

## 2016-07-10 ENCOUNTER — Encounter (HOSPITAL_COMMUNITY): Payer: Self-pay | Admitting: Emergency Medicine

## 2016-07-10 DIAGNOSIS — Z7984 Long term (current) use of oral hypoglycemic drugs: Secondary | ICD-10-CM | POA: Diagnosis not present

## 2016-07-10 DIAGNOSIS — Z79899 Other long term (current) drug therapy: Secondary | ICD-10-CM | POA: Insufficient documentation

## 2016-07-10 DIAGNOSIS — M79604 Pain in right leg: Secondary | ICD-10-CM | POA: Insufficient documentation

## 2016-07-10 DIAGNOSIS — E119 Type 2 diabetes mellitus without complications: Secondary | ICD-10-CM | POA: Diagnosis not present

## 2016-07-10 DIAGNOSIS — Z7982 Long term (current) use of aspirin: Secondary | ICD-10-CM | POA: Insufficient documentation

## 2016-07-10 DIAGNOSIS — F17210 Nicotine dependence, cigarettes, uncomplicated: Secondary | ICD-10-CM | POA: Insufficient documentation

## 2016-07-10 DIAGNOSIS — I10 Essential (primary) hypertension: Secondary | ICD-10-CM | POA: Diagnosis not present

## 2016-07-10 NOTE — ED Provider Notes (Signed)
[facility] DEPT Provider Note   CSN: [id number] Arrival date & time: 07/10/16  3557     History   Chief Complaint Chief Complaint  Patient presents with  . Leg Pain    HPI Jacqueline Reese is a 61 y.o. female.  HPI Patient presents with concern of leg pain. Patient notes that she has had similar pain for about 2 years, currently receiving physical therapy, taking medication as directed, but notes that the pain is worse. Pain is episodic, worse with ambulation, activity, and migratory, with different periods of most severe pain occurring in different areas throughout the leg including lateral hip, anterior knee, lateral calf. No new swelling, no new dyspnea, chest pain. She takes all medication as directed. She has not spoken with her orthopedist in about 6 months, since last cortisone shot.  Past Medical History:  Diagnosis Date  . Arthritis   . Chronic back pain   . Depression   . Diabetes mellitus without complication ([place])   . Gout   . Hypertension   . Schizophrenic disorder [facility])     Patient Active Problem List   Diagnosis Date Noted  . Angioedema 09/13/2015  . Anaphylaxis   . Hypertension, essential   . Acute hypoxemic respiratory failure ([place])   . Type 2 diabetes mellitus without complication, without long-term current use of insulin ([place])   . DIAB W/O MENTION COMP TYPE II/UNS TYPE UNCNTRL 01/19/2010  . FOOT ULCER, RIGHT 01/19/2010  . MENOPAUSE-RELATED VASOMOTOR SYMPTOMS, HOT FLASHES 10/04/2009  . GOUT, UNSPECIFIED 06/07/2009  . LICHEN SIMPLEX CHRONICUS 11/30/2008  . OTITIS EXTERNA, CHRONIC 04/16/2008  . OBESITY, NOS 05/02/2006  . SCHIZOPHRENIA 05/02/2006  . HYPERTENSION, BENIGN SYSTEMIC 05/02/2006  . GASTROESOPHAGEAL REFLUX, NO ESOPHAGITIS 05/02/2006    Past Surgical History:  Procedure Laterality Date  . CESAREAN SECTION     x2  . NASAL ENDOSCOPY Left 09/13/2015   Procedure: NASAL ENDOSCOPY WITH NASAL INTABATION;  Surgeon: Jerrell Belfast, MD;   Location: [facility];  Service: ENT;  Laterality: Left;    OB History    No data available       Home Medications    Prior to Admission medications   Medication Sig Start Date End Date Taking? Authorizing Provider  albuterol (PROVENTIL HFA;VENTOLIN HFA) 108 (90 BASE) MCG/ACT inhaler Inhale 2 puffs into the lungs every 4 (four) hours as needed for wheezing or shortness of breath. 05/09/13  Yes Mabe, Shanon Brow, NP  amLODipine (NORVASC) 5 MG tablet Take 1 tablet (5 mg total) by mouth daily. 09/18/15  Yes Whiteheart, Cristal Ford, NP  aspirin EC 325 MG tablet Take 325 mg by mouth daily.   Yes [provider]  bacitracin ointment Apply 1 application topically 2 (two) times daily. 02/05/16  Yes Waynetta Pean, PA-C  clonazePAM (KLONOPIN) 1 MG tablet Take 1 mg by mouth 2 (two) times daily.    Yes [provider]  cyclobenzaprine (FLEXERIL) 10 MG tablet Take 10 mg by mouth 3 (three) times daily as needed for muscle spasms.   Yes [provider]  Diclofenac Sodium 3 % GEL Place 1 application onto the skin 2 (two) times daily. To affected area. 02/05/16  Yes Waynetta Pean, PA-C  famotidine (PEPCID) 40 MG tablet Take 1 tablet (40 mg total) by mouth daily. 09/18/15  Yes Whiteheart, Cristal Ford, NP  gabapentin (NEURONTIN) 100 MG capsule Take 1 capsule (100 mg total) by mouth 3 (three) times daily. Patient taking differently: Take 300 mg by mouth 2 (two) times daily.  04/13/16  Yes Robyn Haber, MD  gabapentin (NEURONTIN) 300 MG capsule Take 300 mg by mouth 3 (three) times daily. 06/05/16  Yes [provider]  glipiZIDE (GLUCOTROL XL) 2.5 MG 24 hr tablet Take 2.5 mg by mouth daily.   Yes [provider]  HYDROcodone-acetaminophen (NORCO/VICODIN) 5-325 MG tablet Take 1 tablet by mouth every 4 (four) hours as needed. 05/05/16  Yes Mabe, Shanon Brow, NP  metFORMIN (GLUCOPHAGE) 500 MG tablet Take 500 mg by mouth 2 (two) times daily with a meal.   Yes [provider]    spironolactone (ALDACTONE) 50 MG tablet Take 50 mg by mouth daily.   Yes [provider]  ziprasidone (GEODON) 80 MG capsule Take 240 mg by mouth every evening. 07/13/15  Yes [provider]  allopurinol (ZYLOPRIM) 100 MG tablet Take 100 mg by mouth 2 (two) times daily.     [provider]  buPROPion (WELLBUTRIN XL) 150 MG 24 hr tablet Take 150 mg by mouth daily.    [provider]  diphenhydrAMINE (BENADRYL) 25 mg capsule Take 1 capsule (25 mg total) by mouth 2 (two) times daily. 09/18/15 09/21/15  Whiteheart, Cristal Ford, NP  escitalopram (LEXAPRO) 20 MG tablet Take 20 mg by mouth daily.     [provider]  traMADol (ULTRAM) 50 MG tablet Take 1 tablet (50 mg total) by mouth every 6 (six) hours as needed. Patient not taking: Reported on 07/10/2016 04/13/16   Robyn Haber, MD    Family History No family history on file.  Social History Social History  Substance Use Topics  . Smoking status: Current Every Day Smoker    Packs/day: 0.33    Types: Cigarettes  . Smokeless tobacco: Never Used  . Alcohol use No     Allergies   Lisinopril; Chantix [varenicline]; Doxycycline; Ibuprofen; Lamisil [terbinafine hcl]; Prednisone; Cephalexin; and Sulfa antibiotics   Review of Systems Review of Systems  Constitutional:       Per HPI, otherwise negative  HENT:       Per HPI, otherwise negative  Respiratory:       Per HPI, otherwise negative  Cardiovascular:       Per HPI, otherwise negative  Gastrointestinal: Negative for vomiting.  Endocrine:       Negative aside from HPI  Genitourinary:       Neg aside from HPI   Musculoskeletal:       Per HPI, otherwise negative  Skin: Negative.   Neurological: Negative for syncope.     Physical Exam Updated Vital Signs BP 108/74 (BP Location: Right Arm)   Pulse 76   Temp 98.6 F (37 C) (Oral)   Resp 17   LMP 05/09/2010   SpO2 96%   Physical Exam  Constitutional: She is oriented to person,  place, and time. She appears well-developed and well-nourished. No distress.  HENT:  Head: Normocephalic and atraumatic.  Eyes: Conjunctivae and EOM are normal.  Cardiovascular: Normal rate and regular rhythm.   Pulmonary/Chest: Effort normal and breath sounds normal. No stridor. No respiratory distress.  Abdominal: She exhibits no distension.  Musculoskeletal: She exhibits no edema.  No gross deformity of either leg, the patient flexes each hip independently, spontaneously, has equal strength in both knees, ankles.   Neurological: She is alert and oriented to person, place, and time. No cranial nerve deficit.  Gait is slow, with assistance device, which is typical for her Otherwise unremarkable  Skin: Skin is warm and dry.  Psychiatric: She is slowed.  Nursing note and vitals reviewed.    ED Treatments / Results   Procedures Procedures (including critical care time)  12:14 PM Patient requests discharge as she is having a refrigerator delivered to her house in 45 minutes.   Initial Impression / Assessment and Plan / ED Course  I have reviewed the triage vital signs and the nursing notes.  Pertinent labs & imaging results that were available during my care of the patient were reviewed by me and considered in my medical decision making (see chart for details).  Patient presents with acute on chronic right leg pain. He is awake and alert, with no neurovascular compromise, no history of trauma, low suspicion for acute or occult fracture. Patient has an orthopedist, will follow up with him promptly to discuss additional occasions, therapeutic options. No evidence for new DVT, septic arthritis, or other concerning findings, and the patient is appropriate for further evaluation and management, as an outpatient.  Final Clinical Impressions(s) / ED Diagnoses  Lower extremity pain, right.   Carmin Muskrat, MD 07/10/16 1214

## 2016-07-10 NOTE — ED Notes (Signed)
Pt did not need anything at this time  

## 2016-07-10 NOTE — Discharge Instructions (Signed)
As discussed, your evaluation today has been largely reassuring.  But, it is important that you monitor your condition carefully, and do not hesitate to return to the ED if you develop new, or concerning changes in your condition. ? ?Otherwise, please follow-up with your physician for appropriate ongoing care. ? ?

## 2016-07-10 NOTE — ED Triage Notes (Signed)
Pt states shes had R upper leg pain x1 week, went to her PCP and was given pain medicine. Pain is no better. Pt ambulatory with some pain, states its starts in her R hip and travels down her leg. No injury. No brusiing or swelling noted.

## 2016-08-12 ENCOUNTER — Encounter (HOSPITAL_COMMUNITY): Payer: Self-pay | Admitting: Emergency Medicine

## 2016-08-12 ENCOUNTER — Ambulatory Visit (HOSPITAL_COMMUNITY)
Admission: EM | Admit: 2016-08-12 | Discharge: 2016-08-12 | Disposition: A | Discharge status: Home / Self Care | Payer: Medicare HMO | Attending: Internal Medicine | Admitting: Internal Medicine

## 2016-08-12 DIAGNOSIS — G8929 Other chronic pain: Secondary | ICD-10-CM

## 2016-08-12 DIAGNOSIS — M25561 Pain in right knee: Secondary | ICD-10-CM

## 2016-08-12 MED ORDER — NAPROXEN 500 MG PO TABS: 500.0000 mg | ORAL_TABLET | Freq: Two times a day (BID) | ORAL | 0 refills | Status: DC

## 2016-08-12 NOTE — ED Triage Notes (Signed)
The patient presented to the [facility] with a complaint of right knee and leg pain x 1 week. The patient denied any known injury.

## 2016-08-12 NOTE — ED Provider Notes (Signed)
CSN: [id number]     Arrival date & time 08/12/16  1209 History   First MD Initiated Contact with Patient 08/12/16 1253     Chief Complaint  Patient presents with  . Knee Pain   (Consider location/radiation/quality/duration/timing/severity/associated sxs/prior Treatment) 61 year old female presents to clinic with a chief complaint of chronic right knee pain, past history of arthritis in his knee, having a one-week history of worsening pain to this knee.   The history is provided by the patient.  Knee Pain  Location:  Knee Time since incident:  1 week Injury: no   Knee location:  R knee Pain details:    Quality:  Aching, dull and throbbing   Radiates to:  Does not radiate   Severity:  Severe   Onset quality:  Gradual   Duration:  1 week   Timing:  Constant   Progression:  Worsening Chronicity:  Chronic Dislocation: no   Prior injury to area:  No Relieved by:  Rest and elevation Worsened by:  Bearing weight and extension Associated symptoms: no back pain, no decreased ROM, no fever, no numbness, no stiffness, no swelling and no tingling   Risk factors: no concern for non-accidental trauma     Past Medical History:  Diagnosis Date  . Arthritis   . Chronic back pain   . Depression   . Diabetes mellitus without complication ([place])   . Gout   . Hypertension   . Schizophrenic disorder [facility])    Past Surgical History:  Procedure Laterality Date  . CESAREAN SECTION     x2  . NASAL ENDOSCOPY Left 09/13/2015   Procedure: NASAL ENDOSCOPY WITH NASAL INTABATION;  Surgeon: Jerrell Belfast, MD;  Location: [facility];  Service: ENT;  Laterality: Left;   History reviewed. No pertinent family history. Social History  Substance Use Topics  . Smoking status: Current Every Day Smoker    Packs/day: 0.33    Types: Cigarettes  . Smokeless tobacco: Never Used  . Alcohol use No   OB History    No data available     Review of Systems  Constitutional: Negative for fever.  Respiratory:  Negative.   Cardiovascular: Negative.   Gastrointestinal: Negative.   Musculoskeletal: Negative for back pain and stiffness.  Skin: Negative.   Neurological: Negative.     Allergies  Lisinopril; Chantix [varenicline]; Doxycycline; Ibuprofen; Lamisil [terbinafine hcl]; Prednisone; Cephalexin; and Sulfa antibiotics  Home Medications   Prior to Admission medications   Medication Sig Start Date End Date Taking? Authorizing Provider  albuterol (PROVENTIL HFA;VENTOLIN HFA) 108 (90 BASE) MCG/ACT inhaler Inhale 2 puffs into the lungs every 4 (four) hours as needed for wheezing or shortness of breath. 05/09/13   Janne Napoleon, NP  allopurinol (ZYLOPRIM) 100 MG tablet Take 100 mg by mouth 2 (two) times daily.     [provider]  amLODipine (NORVASC) 5 MG tablet Take 1 tablet (5 mg total) by mouth daily. 09/18/15   Whiteheart, Cristal Ford, NP  aspirin EC 325 MG tablet Take 325 mg by mouth daily.    [provider]  bacitracin ointment Apply 1 application topically 2 (two) times daily. 02/05/16   Waynetta Pean, PA-C  buPROPion (WELLBUTRIN XL) 150 MG 24 hr tablet Take 150 mg by mouth daily.    [provider]  clonazePAM (KLONOPIN) 1 MG tablet Take 1 mg by mouth 2 (two) times daily.     [provider]  cyclobenzaprine (FLEXERIL) 10 MG tablet Take 10 mg by mouth 3 (three)  times daily as needed for muscle spasms.    [provider]  Diclofenac Sodium 3 % GEL Place 1 application onto the skin 2 (two) times daily. To affected area. 02/05/16   Waynetta Pean, PA-C  diphenhydrAMINE (BENADRYL) 25 mg capsule Take 1 capsule (25 mg total) by mouth 2 (two) times daily. 09/18/15 09/21/15  Whiteheart, Cristal Ford, NP  escitalopram (LEXAPRO) 20 MG tablet Take 20 mg by mouth daily.     [provider]  famotidine (PEPCID) 40 MG tablet Take 1 tablet (40 mg total) by mouth daily. 09/18/15   Whiteheart, Cristal Ford, NP  gabapentin (NEURONTIN) 100 MG capsule Take 1 capsule (100  mg total) by mouth 3 (three) times daily. Patient taking differently: Take 300 mg by mouth 2 (two) times daily.  04/13/16   Robyn Haber, MD  gabapentin (NEURONTIN) 300 MG capsule Take 300 mg by mouth 3 (three) times daily. 06/05/16   [provider]  glipiZIDE (GLUCOTROL XL) 2.5 MG 24 hr tablet Take 2.5 mg by mouth daily.    [provider]  HYDROcodone-acetaminophen (NORCO/VICODIN) 5-325 MG tablet Take 1 tablet by mouth every 4 (four) hours as needed. 05/05/16   Janne Napoleon, NP  metFORMIN (GLUCOPHAGE) 500 MG tablet Take 500 mg by mouth 2 (two) times daily with a meal.    [provider]  naproxen (NAPROSYN) 500 MG tablet Take 1 tablet (500 mg total) by mouth 2 (two) times daily. 08/12/16   Barnet Glasgow, NP  spironolactone (ALDACTONE) 50 MG tablet Take 50 mg by mouth daily.    [provider]  ziprasidone (GEODON) 80 MG capsule Take 240 mg by mouth every evening. 07/13/15   [provider]   Meds Ordered and Administered this Visit  Medications - No data to display  BP 113/75 (BP Location: Left Arm)   Pulse 74   Temp 98.3 F (36.8 C) (Oral)   Resp 16   LMP 05/09/2010   SpO2 100%  No data found.   Physical Exam  Constitutional: She is oriented to person, place, and time. She appears well-developed and well-nourished. No distress.  HENT:  Head: Normocephalic.  Right Ear: External ear normal.  Left Ear: External ear normal.  Eyes: Conjunctivae are normal.  Neck: Normal range of motion.  Cardiovascular: Normal rate and regular rhythm.   Pulmonary/Chest: Effort normal and breath sounds normal.  Musculoskeletal:       Right knee: She exhibits no swelling, no effusion, no LCL laxity, normal patellar mobility and no MCL laxity.  No swelling, effusion, valgus or varus instability, abnormal patellar mobility of the knee, subjective report of pain with extension.  Neurological: She is alert and oriented to person, place, and time.  Skin: Skin  is warm. Capillary refill takes less than 2 seconds. She is not diaphoretic.  Psychiatric: She has a normal mood and affect.  Nursing note and vitals reviewed.   Urgent Care Course     Procedures (including critical care time)  Labs Review Labs Reviewed - No data to display  Imaging Review No results found.    MDM   1. Chronic pain of right knee    Impression prescription for Naprosyn, recommend rest, elevation whenever possible, follow-up with primary care provider as needed for further evaluation and management.    Barnet Glasgow, NP 08/12/16 1642

## 2016-08-12 NOTE — Discharge Instructions (Signed)
For your chronic knee pain, started on a medicine called Naprosyn, take one tablet twice a day. If your knee pain persists, or fails to resolve, follow up with your primary care provider, or an orthopedist.

## 2016-08-23 ENCOUNTER — Ambulatory Visit (INDEPENDENT_AMBULATORY_CARE_PROVIDER_SITE_OTHER): Payer: Medicare HMO

## 2016-08-23 ENCOUNTER — Encounter (INDEPENDENT_AMBULATORY_CARE_PROVIDER_SITE_OTHER): Payer: Self-pay | Admitting: Orthopedic Surgery

## 2016-08-23 ENCOUNTER — Ambulatory Visit (INDEPENDENT_AMBULATORY_CARE_PROVIDER_SITE_OTHER): Payer: Medicare HMO | Admitting: Orthopedic Surgery

## 2016-08-23 VITALS — Ht 62.0 in | Wt 143.0 lb

## 2016-08-23 DIAGNOSIS — E1142 Type 2 diabetes mellitus with diabetic polyneuropathy: Secondary | ICD-10-CM | POA: Diagnosis not present

## 2016-08-23 DIAGNOSIS — G8929 Other chronic pain: Secondary | ICD-10-CM

## 2016-08-23 DIAGNOSIS — M25561 Pain in right knee: Secondary | ICD-10-CM

## 2016-08-23 DIAGNOSIS — M1A0610 Idiopathic chronic gout, right knee, without tophus (tophi): Secondary | ICD-10-CM

## 2016-08-23 MED ORDER — NAPROXEN 500 MG PO TABS: 500.0000 mg | ORAL_TABLET | Freq: Two times a day (BID) | ORAL | 0 refills | Status: DC | PRN

## 2016-08-23 NOTE — Progress Notes (Signed)
Office Visit Note   Patient: ROWENA MOILANEN           Date of Birth: 08-24-1955           MRN: [id number] Visit Date: 08/23/2016              Requested by: Nolene Ebbs, MD [address] [place], [place] [address] PCP: Nolene Ebbs, MD  Chief Complaint  Patient presents with  . Right Knee - Pain    F/u for [facility] ER visit 08/12/16      HPI: Patient is a 61 year old woman with diabetic insensate neuropathy gout with chronic right knee pain. Patient went to the emergency room and was referred here for evaluation radiographs were not obtained in the emergency room. Patient states she does take Naprosyn for her knee pain she states she also takes allopurinol and that her primary care physician manages her gout.  Assessment & Plan: Visit Diagnoses:  1. Chronic pain of right knee   2. Idiopathic chronic gout of right knee without tophus   3. Diabetic polyneuropathy associated with type 2 diabetes mellitus ([place])     Plan: Recommend continue with the Naprosyn, a prescription was called in, recommended physical therapy. Patient states she cannot get out of her house that she is homebound and we will request home health physical therapy. Discussed the option of a steroid injection. Patient refuses a steroid injection at this time.  Follow-Up Instructions: Return in about 2 months (around 10/23/2016).   Ortho Exam  Patient is alert, oriented, no adenopathy, well-dressed, normal affect, normal respiratory effort. Examination patient ambulates in a wheelchair. She has crepitation range of motion right knee there is no effusion no redness no cellulitis no sensitivity to light touch. Patient has stable collateral cruciate ligaments. The medial lateral joint line are minimally tender to palpation.  Imaging: Xr Knee 1-2 Views Right  Result Date: 08/23/2016 Two-view radiographs the right knee shows good joint space maintenance she does have subchondral sclerosis and several loose  bodies.   Labs: Lab Results  Component Value Date   HGBA1C 8.4 01/19/2010   LABURIC 5.9 07/08/2009   LABURIC 9.9 (H) 06/06/2009   REPTSTATUS 06/04/2010 FINAL 06/02/2010   CULT  06/02/2010    GROUP B STREP(S.AGALACTIAE)ISOLATED Note: TESTING AGAINST S. AGALACTIAE NOT ROUTINELY PERFORMED DUE TO PREDICTABILITY OF AMP/PEN/VAN SUSCEPTIBILITY.    Orders:  Orders Placed This Encounter  Procedures  . XR Knee 1-2 Views Right   Meds ordered this encounter  Medications  . naproxen (NAPROSYN) 500 MG tablet    Sig: Take 1 tablet (500 mg total) by mouth 2 (two) times daily as needed for mild pain or moderate pain. With meals    Dispense:  60 tablet    Refill:  0     Procedures: No procedures performed  Clinical Data: No additional findings.  ROS:  All other systems negative, except as noted in the HPI. Review of Systems  Objective: Vital Signs: Ht 5\' 2"  (1.575 m)   Wt 143 lb (64.9 kg)   LMP 05/09/2010   BMI 26.16 kg/m   Specialty Comments:  No specialty comments available.  PMFS History: Patient Active Problem List   Diagnosis Date Noted  . Angioedema 09/13/2015  . Anaphylaxis   . Hypertension, essential   . Acute hypoxemic respiratory failure ([place])   . Type 2 diabetes mellitus without complication, without long-term current use of insulin ([place])   . DIAB W/O MENTION COMP TYPE II/UNS TYPE UNCNTRL 01/19/2010  .  FOOT ULCER, RIGHT 01/19/2010  . MENOPAUSE-RELATED VASOMOTOR SYMPTOMS, HOT FLASHES 10/04/2009  . GOUT, UNSPECIFIED 06/07/2009  . LICHEN SIMPLEX CHRONICUS 11/30/2008  . OTITIS EXTERNA, CHRONIC 04/16/2008  . OBESITY, NOS 05/02/2006  . SCHIZOPHRENIA 05/02/2006  . HYPERTENSION, BENIGN SYSTEMIC 05/02/2006  . GASTROESOPHAGEAL REFLUX, NO ESOPHAGITIS 05/02/2006   Past Medical History:  Diagnosis Date  . Arthritis   . Chronic back pain   . Depression   . Diabetes mellitus without complication ([place])   . Gout   . Hypertension   . Schizophrenic disorder ([place])      No family history on file.  Past Surgical History:  Procedure Laterality Date  . CESAREAN SECTION     x2  . NASAL ENDOSCOPY Left 09/13/2015   Procedure: NASAL ENDOSCOPY WITH NASAL INTABATION;  Surgeon: Jerrell Belfast, MD;  Location: [name] OR;  Service: ENT;  Laterality: Left;   Social History   Occupational History  . Not on file.   Social History Main Topics  . Smoking status: Current Every Day Smoker    Packs/day: 0.33    Types: Cigarettes  . Smokeless tobacco: Never Used  . Alcohol use No  . Drug use: No  . Sexual activity: Not on file

## 2016-08-27 ENCOUNTER — Telehealth (INDEPENDENT_AMBULATORY_CARE_PROVIDER_SITE_OTHER): Payer: Self-pay

## 2016-08-27 NOTE — Telephone Encounter (Signed)
Patient would like a Rx for pain.  Stated that the Rx that Dr. Sharol Given prescribed is not helping.  CB# is [phone].  Please Advise.

## 2016-08-28 ENCOUNTER — Other Ambulatory Visit (INDEPENDENT_AMBULATORY_CARE_PROVIDER_SITE_OTHER): Payer: Self-pay | Admitting: Orthopedic Surgery

## 2016-08-28 ENCOUNTER — Telehealth (INDEPENDENT_AMBULATORY_CARE_PROVIDER_SITE_OTHER): Payer: Self-pay | Admitting: Orthopedic Surgery

## 2016-08-28 MED ORDER — NABUMETONE 750 MG PO TABS: 750.0000 mg | ORAL_TABLET | Freq: Two times a day (BID) | ORAL | 3 refills | Status: AC | PRN

## 2016-08-28 NOTE — Telephone Encounter (Signed)
I called and sw pt to advise that rx has been faxed to pharm.

## 2016-08-28 NOTE — Telephone Encounter (Signed)
Pt was evaluated in the office for a chronic right knee pain with underlying gout of the right knee. She states that naprosyn that was given does not work and is wanting something else. Pt does have gout medication. She declined an injection while in office last week. Please advise.

## 2016-08-28 NOTE — Telephone Encounter (Signed)
Patient asked for another pain medication, she said the one that was prescribed to her isn't working. CB # [phone]

## 2016-08-28 NOTE — Telephone Encounter (Signed)
Prescription sent to her pharmacy for Relafen

## 2016-09-06 ENCOUNTER — Telehealth (INDEPENDENT_AMBULATORY_CARE_PROVIDER_SITE_OTHER): Payer: Self-pay | Admitting: Orthopedic Surgery

## 2016-09-06 NOTE — Telephone Encounter (Signed)
I called and spoke with patient, she denies ever wanting a shot or injection. She would like  Medication. Tried relafen, naprosyn and would like something stronger. Would like something she can still drive on. I sent message to Dr. Sharol Given asking if anything else can be prescribed. She uses [facility] on [address]. I advised her I will not be able to give her an answer right away due to Dr. Sharol Given seeing patients in the office at this time.

## 2016-09-06 NOTE — Telephone Encounter (Signed)
Patient called advised she is still in a lot of pain. Patient said she is taking her pain medicine every six hours. Patient asked if Dr Sharol Given can prescribe her something stronger for pain. Patient said she is having a problem getting into her bed.  Patient asked for something that will allow her to drive. The number to contact patient is [phone]

## 2016-09-06 NOTE — Telephone Encounter (Signed)
I called and sw the pt and advised Dr. Sharol Given states that she can come in for an injection or she can continue taking anti inflammatory pt declined the injections she will continue with current treatment and she will call with any questions. She will also call and notify if she changes her mind and wants to proceed with injections

## 2016-09-06 NOTE — Telephone Encounter (Signed)
PT REQUESTED A CALL BACK PLEASE DUE TO THE MEDICINE SHE WAS PRESCRIBED IS NOT WORKING FOR HER. WANTS TO TALK ABOUT GETTING A SHOT.   [phone]

## 2016-09-06 NOTE — Telephone Encounter (Signed)
Call patient and recommend that we can proceed with a steroid injection for her knee. The narcotic pain medicine is not safe to drive on and the anti-inflammatories like she is taken are our only options

## 2016-09-06 NOTE — Telephone Encounter (Signed)
Autumn spoke with this patient to discuss message below.

## 2016-09-20 ENCOUNTER — Telehealth (INDEPENDENT_AMBULATORY_CARE_PROVIDER_SITE_OTHER): Payer: Self-pay | Admitting: Orthopedic Surgery

## 2016-09-20 NOTE — Telephone Encounter (Signed)
PT ASKED IF THERE IS SOMETHING WE CAN GIVE HER TO WRAP HER LEGS AS SHE CANNOT PUT PRESSURE ON HER LEGS AT ALL. SHE STATED SHE HAS MEDICAID TO PAY FOR THIS.  PLEASE ADVISE.  [phone]

## 2016-09-20 NOTE — Telephone Encounter (Signed)
I'm not certain of what this patient is asking, I will call tomorrow to follow up, medicaid does not cover any type of elastic therapy.

## 2016-09-20 NOTE — Telephone Encounter (Signed)
Patient called advised she is hurting in her thigh, hip and leg. Patient said the [facility] nurse said she was having muscle spasms. Patient said she has had the spasms for a week. Patient asked if she can get some medicine for the muscle spasms. Patient said she can not put her foot down.  Patient asked if she can be called in the morning. The number to contact patient is [phone]

## 2016-09-20 NOTE — Telephone Encounter (Signed)
Returned call to patient line busy  [phone]

## 2016-09-21 ENCOUNTER — Encounter (INDEPENDENT_AMBULATORY_CARE_PROVIDER_SITE_OTHER): Payer: Self-pay | Admitting: Family

## 2016-09-21 ENCOUNTER — Ambulatory Visit (INDEPENDENT_AMBULATORY_CARE_PROVIDER_SITE_OTHER): Payer: Medicare HMO

## 2016-09-21 ENCOUNTER — Ambulatory Visit (INDEPENDENT_AMBULATORY_CARE_PROVIDER_SITE_OTHER): Payer: Medicare HMO | Admitting: Family

## 2016-09-21 DIAGNOSIS — M5410 Radiculopathy, site unspecified: Secondary | ICD-10-CM

## 2016-09-21 DIAGNOSIS — M1611 Unilateral primary osteoarthritis, right hip: Secondary | ICD-10-CM

## 2016-09-21 MED ORDER — PREDNISONE 10 MG (21) PO TBPK: ORAL_TABLET | Freq: Every day | ORAL | 0 refills | Status: DC

## 2016-09-21 NOTE — Telephone Encounter (Signed)
Patient is here in the office today

## 2016-09-21 NOTE — Progress Notes (Signed)
Office Visit Note   Patient: Jacqueline Reese           Date of Birth: [birth year]           MRN: [id number] Visit Date: 09/21/2016              Requested by: Nolene Ebbs, MD [address] [place], [place] [address] PCP: Nolene Ebbs, MD  Chief Complaint  Patient presents with  . Right Leg - Pain  . Left Leg - Pain  . Pelvis - Pain      HPI: The patient is a 61 year old woman who presents today complaining of several different issues. She states that she has been having myalgias mainly to the right lower extremity worse over the last 2 weeks. States this feels different than pain she is having past. States that she has been unable to walk for the last year due to muscle pain has been in physical therapy for the last year. She relates that this was related to taking lisinopril had a reaction to this.  Does have a known history of osteoarthritis bilateral hips right worse than left. Historical radiographs in system.  Today complaining of him right posterior thigh and anterior thigh pain this radiates down her calf. Denies any back pain. Does have some intermittent pain in her left calf as well. Unsure if she is having muscle spasms. Describes thigh and calf pain on right as aching, burning and constant. Is worse with weight bearing.   Leaning forward does not alleviate symptoms.   Assessment & Plan: Visit Diagnoses:  1. Radicular syndrome of right lower extremity   2. Unilateral primary osteoarthritis, right hip     Plan: She is nervous about needles. Discussed injection for right hip. Not interested in THA. Will refer to [facility] for cortisone injection.   Possible radiculopathy to RLE. Will trial prednisone taper for sciatica.  Follow-Up Instructions: Return in about 4 weeks (around 10/19/2016).   Right Hip Exam   Tenderness  The patient is experiencing tenderness in the lateral.  Tests  FABER: positive  Comments:  Flexion and extension as well as rotation of  hip painful   Left Hip Exam   Tenderness  The patient is experiencing no tenderness.     Comments:  Painless rom left hip   Back Exam   Tenderness  The patient is experiencing tenderness in the sacroiliac.  Muscle Strength  The patient has normal back strength.  Tests  Straight leg raise right: negative Straight leg raise left: negative      Patient is alert, oriented, no adenopathy, well-dressed, normal affect, normal respiratory effort.   Imaging: Xr Lumbar Spine 2-3 Views  Result Date: 09/21/2016 Radiographs of the lumbar spine show grade 1 anteriolisthesis of L4. Disc spaces well preserved. No osteophytic spurring. Hips visulalized, bone on bone contact with spurring bilaterally.    Labs: Lab Results  Component Value Date   HGBA1C 8.4 01/19/2010   LABURIC 5.9 07/08/2009   LABURIC 9.9 (H) 06/06/2009   REPTSTATUS 06/04/2010 FINAL 06/02/2010   CULT  06/02/2010    GROUP B STREP(S.AGALACTIAE)ISOLATED Note: TESTING AGAINST S. AGALACTIAE NOT ROUTINELY PERFORMED DUE TO PREDICTABILITY OF AMP/PEN/VAN SUSCEPTIBILITY.    Orders:  Orders Placed This Encounter  Procedures  . XR Lumbar Spine 2-3 Views   Meds ordered this encounter  Medications  . predniSONE (STERAPRED UNI-PAK 21 TAB) 10 MG (21) TBPK tablet    Sig: Take by mouth daily.    Dispense:  21 tablet  Refill:  0     Procedures: No procedures performed  Clinical Data: No additional findings.  ROS:  All other systems negative, except as noted in the HPI. Review of Systems  Constitutional: Negative for chills and fever.  Cardiovascular: Negative for leg swelling.  Musculoskeletal: Positive for arthralgias, gait problem and myalgias. Negative for back pain.  Skin: Negative for color change.  Neurological: Negative for weakness and numbness.    Objective: Vital Signs: LMP 05/09/2010   Specialty Comments:  No specialty comments available.  PMFS History: Patient Active Problem List    Diagnosis Date Noted  . Angioedema 09/13/2015  . Anaphylaxis   . Hypertension, essential   . Acute hypoxemic respiratory failure ([place])   . Type 2 diabetes mellitus without complication, without long-term current use of insulin ([place])   . DIAB W/O MENTION COMP TYPE II/UNS TYPE UNCNTRL 01/19/2010  . FOOT ULCER, RIGHT 01/19/2010  . MENOPAUSE-RELATED VASOMOTOR SYMPTOMS, HOT FLASHES 10/04/2009  . GOUT, UNSPECIFIED 06/07/2009  . LICHEN SIMPLEX CHRONICUS 11/30/2008  . OTITIS EXTERNA, CHRONIC 04/16/2008  . OBESITY, NOS 05/02/2006  . SCHIZOPHRENIA 05/02/2006  . HYPERTENSION, BENIGN SYSTEMIC 05/02/2006  . GASTROESOPHAGEAL REFLUX, NO ESOPHAGITIS 05/02/2006   Past Medical History:  Diagnosis Date  . Arthritis   . Chronic back pain   . Depression   . Diabetes mellitus without complication ([place])   . Gout   . Hypertension   . Schizophrenic disorder ([place])     No family history on file.  Past Surgical History:  Procedure Laterality Date  . CESAREAN SECTION     x2  . NASAL ENDOSCOPY Left 09/13/2015   Procedure: NASAL ENDOSCOPY WITH NASAL INTABATION;  Surgeon: Jerrell Belfast, MD;  Location: [facility] OR;  Service: ENT;  Laterality: Left;   Social History   Occupational History  . Not on file.   Social History Main Topics  . Smoking status: Current Every Day Smoker    Packs/day: 0.33    Types: Cigarettes  . Smokeless tobacco: Never Used  . Alcohol use No  . Drug use: No  . Sexual activity: Not on file

## 2016-09-24 ENCOUNTER — Other Ambulatory Visit (INDEPENDENT_AMBULATORY_CARE_PROVIDER_SITE_OTHER): Payer: Self-pay | Admitting: Orthopedic Surgery

## 2016-09-24 ENCOUNTER — Other Ambulatory Visit (INDEPENDENT_AMBULATORY_CARE_PROVIDER_SITE_OTHER): Payer: Self-pay | Admitting: Family

## 2016-09-24 NOTE — Telephone Encounter (Signed)
Do you wish to fill

## 2016-09-24 NOTE — Telephone Encounter (Signed)
Do you wish to refill? 

## 2016-09-25 ENCOUNTER — Other Ambulatory Visit (INDEPENDENT_AMBULATORY_CARE_PROVIDER_SITE_OTHER): Payer: Self-pay | Admitting: Family

## 2016-09-25 ENCOUNTER — Telehealth (INDEPENDENT_AMBULATORY_CARE_PROVIDER_SITE_OTHER): Payer: Self-pay | Admitting: Orthopedic Surgery

## 2016-09-25 NOTE — Telephone Encounter (Signed)
Received voicemail message from patient needing refill on her pain medicine. The number to contact patient is [phone]

## 2016-09-25 NOTE — Telephone Encounter (Signed)
Advised patient over the phone this morning that she needs to continue with steroid dose pack, this should be helping with her symptoms and she needs to follow through with this medication. Patient is taking naproxen too and this was just given yesterday. Advised that no narcotics will be written, that she is on the appropriate medications for the symptoms she is having. She expressed understanding.

## 2016-09-26 ENCOUNTER — Other Ambulatory Visit (INDEPENDENT_AMBULATORY_CARE_PROVIDER_SITE_OTHER): Payer: Self-pay | Admitting: Family

## 2016-09-26 ENCOUNTER — Telehealth (INDEPENDENT_AMBULATORY_CARE_PROVIDER_SITE_OTHER): Payer: Self-pay | Admitting: Orthopedic Surgery

## 2016-09-26 NOTE — Telephone Encounter (Signed)
Patient called advised she took her last prednisone tab yesterday at bedtime. The number to contact patient is [phone]

## 2016-09-26 NOTE — Telephone Encounter (Signed)
I called and spoke with patient advised that it is a prednisone dose pack and that is all it is intended. She needs to give this time to work.

## 2016-09-28 ENCOUNTER — Other Ambulatory Visit (INDEPENDENT_AMBULATORY_CARE_PROVIDER_SITE_OTHER): Payer: Self-pay | Admitting: Physical Medicine and Rehabilitation

## 2016-09-28 ENCOUNTER — Other Ambulatory Visit (INDEPENDENT_AMBULATORY_CARE_PROVIDER_SITE_OTHER): Payer: Self-pay | Admitting: Family

## 2016-09-28 ENCOUNTER — Encounter (HOSPITAL_COMMUNITY): Payer: Self-pay | Admitting: Family Medicine

## 2016-09-28 ENCOUNTER — Ambulatory Visit (HOSPITAL_COMMUNITY)
Admission: EM | Admit: 2016-09-28 | Discharge: 2016-09-28 | Disposition: A | Discharge status: Home / Self Care | Payer: Medicare HMO | Attending: Physician Assistant | Admitting: Physician Assistant

## 2016-09-28 DIAGNOSIS — M79604 Pain in right leg: Secondary | ICD-10-CM | POA: Diagnosis not present

## 2016-09-28 DIAGNOSIS — G8929 Other chronic pain: Secondary | ICD-10-CM | POA: Diagnosis not present

## 2016-09-28 DIAGNOSIS — M79605 Pain in left leg: Secondary | ICD-10-CM

## 2016-09-28 MED ORDER — PREDNISONE 10 MG PO TABS: 20.0000 mg | ORAL_TABLET | Freq: Every day | ORAL | 0 refills | Status: DC

## 2016-09-28 NOTE — Telephone Encounter (Signed)
Patient calling again stating she needs something for pain.

## 2016-09-28 NOTE — ED Provider Notes (Signed)
CSN: [id number]     Arrival date & time 09/28/16  1044 History   None    Chief Complaint  Patient presents with  . Leg Pain   (Consider location/radiation/quality/duration/timing/severity/associated sxs/prior Treatment) 61 year old female with arthritis, chronic leg pain, diabetes, comes in for management of leg pain. States has been going for a long time, denies any injury that is exacerbating the pain. Denies numbness, tingling. States PCP and orthopedics usually treat her leg pain, but can be seen until later in the month. States she is in pain and needs something for it. States naproxen is not helping. She has recently been on a prednisone pack, and finished this Tuesday. States the prednisone does not change her blood glucose. She states her glucose usually runs in the 80s to 90s.      Past Medical History:  Diagnosis Date  . Arthritis   . Chronic back pain   . Depression   . Diabetes mellitus without complication ([place])   . Gout   . Hypertension   . Schizophrenic disorder [facility])    Past Surgical History:  Procedure Laterality Date  . CESAREAN SECTION     x2  . NASAL ENDOSCOPY Left 09/13/2015   Procedure: NASAL ENDOSCOPY WITH NASAL INTABATION;  Surgeon: Jerrell Belfast, MD;  Location: [facility];  Service: ENT;  Laterality: Left;   History reviewed. No pertinent family history. Social History  Substance Use Topics  . Smoking status: Current Every Day Smoker    Packs/day: 0.33    Types: Cigarettes  . Smokeless tobacco: Never Used  . Alcohol use No   OB History    No data available     Review of Systems  Respiratory: Negative for cough, shortness of breath and wheezing.   Cardiovascular: Negative for chest pain and palpitations.  Musculoskeletal: Positive for arthralgias, back pain and myalgias. Negative for joint swelling.    Allergies  Lisinopril; Chantix [varenicline]; Doxycycline; Ibuprofen; Lamisil [terbinafine hcl]; Prednisone; Cephalexin; and Sulfa  antibiotics  Home Medications   Prior to Admission medications   Medication Sig Start Date End Date Taking? Authorizing Provider  Acetaminophen 500 MG coapsule  09/18/16   [provider]  albuterol (PROVENTIL HFA;VENTOLIN HFA) 108 (90 BASE) MCG/ACT inhaler Inhale 2 puffs into the lungs every 4 (four) hours as needed for wheezing or shortness of breath. 05/09/13   Janne Napoleon, NP  allopurinol (ZYLOPRIM) 100 MG tablet Take 100 mg by mouth 2 (two) times daily.     [provider]  amLODipine (NORVASC) 5 MG tablet Take 1 tablet (5 mg total) by mouth daily. 09/18/15   Whiteheart, Cristal Ford, NP  aspirin EC 325 MG tablet Take 325 mg by mouth daily.    [provider]  bacitracin ointment Apply 1 application topically 2 (two) times daily. 02/05/16   Waynetta Pean, PA-C  buPROPion (WELLBUTRIN XL) 150 MG 24 hr tablet Take 150 mg by mouth daily.    [provider]  clonazePAM (KLONOPIN) 1 MG tablet Take 1 mg by mouth 2 (two) times daily.     [provider]  cyclobenzaprine (FLEXERIL) 10 MG tablet Take 10 mg by mouth 3 (three) times daily as needed for muscle spasms.    [provider]  Diclofenac Sodium 3 % GEL Place 1 application onto the skin 2 (two) times daily. To affected area. 02/05/16   Waynetta Pean, PA-C  diphenhydrAMINE (BENADRYL) 25 mg capsule Take 1 capsule (25 mg total) by mouth 2 (two) times daily. 09/18/15 09/21/15  Whiteheart, Kathryn A, NP  escitalopram (LEXAPRO) 20 MG tablet Take 20 mg by mouth daily.     [provider]  famotidine (PEPCID) 40 MG tablet Take 1 tablet (40 mg total) by mouth daily. 09/18/15   Whiteheart, Cristal Ford, NP  gabapentin (NEURONTIN) 100 MG capsule Take 1 capsule (100 mg total) by mouth 3 (three) times daily. Patient taking differently: Take 300 mg by mouth 2 (two) times daily.  04/13/16   Robyn Haber, MD  gabapentin (NEURONTIN) 300 MG capsule Take 300 mg by mouth 3 (three) times daily. 06/05/16    [provider]  glipiZIDE (GLUCOTROL XL) 2.5 MG 24 hr tablet Take 2.5 mg by mouth daily.    [provider]  HYDROcodone-acetaminophen (NORCO/VICODIN) 5-325 MG tablet Take 1 tablet by mouth every 4 (four) hours as needed. 05/05/16   Janne Napoleon, NP  metFORMIN (GLUCOPHAGE) 500 MG tablet Take 500 mg by mouth 2 (two) times daily with a meal.    [provider]  naproxen (NAPROSYN) 500 MG tablet Take 1 tablet (500 mg total) by mouth 2 (two) times daily. 08/12/16   Barnet Glasgow, NP  naproxen (NAPROSYN) 500 MG tablet take 1 tablet by mouth twice a day if needed for pain with meals 09/24/16   Suzan Slick, NP  predniSONE (DELTASONE) 10 MG tablet Take 2 tablets (20 mg total) by mouth daily. 09/28/16   Ok Edwards, PA-C  spironolactone (ALDACTONE) 50 MG tablet Take 50 mg by mouth daily.    [provider]  VOLTAREN 1 % GEL  09/13/16   [provider]  ziprasidone (GEODON) 80 MG capsule Take 240 mg by mouth every evening. 07/13/15   [provider]   Meds Ordered and Administered this Visit  Medications - No data to display  BP 103/71   Pulse 84   Temp 98.1 F (36.7 C)   Resp 18   LMP 05/09/2010   SpO2 99%  No data found.   Physical Exam  Constitutional: She is oriented to person, place, and time. She appears well-developed and well-nourished. No distress.  HENT:  Head: Normocephalic and atraumatic.  Musculoskeletal:  No pain on palpation of the knees or lower legs. No swelling noted. Strength normal and equal bilaterally, sensation intact. Patient sitting in wheelchair, did not attempt to ambulate and patient due to her pain. Range of motion was deferred due to that.  Neurological: She is alert and oriented to person, place, and time.  Skin: Skin is warm and dry.  Psychiatric: She has a normal mood and affect. Her behavior is normal. Judgment normal.    Urgent Care Course     Procedures (including critical care time)  Labs  Review Labs Reviewed - No data to display  Imaging Review No results found.       MDM   1. Chronic pain of both lower extremities    Discussed with patient she'll need to follow up with her primary care or orthopedics for management of her chronic pain. Given patient is on naproxen, with no recent blood work for renal function, deferred Toradol shot. Patient requesting prednisone treatment, stating it didn't help and she was on it. She reassured me that prednisone does not change her blood glucose. Start prednisone 20 mg once a day for 4 days, patient to monitor her blood glucose closely. Follow up with PCP for further treatment needed.   Ok Edwards, PA-C 09/28/16 1240

## 2016-09-28 NOTE — ED Triage Notes (Signed)
Pt here for chronic leg pain and arthritis.

## 2016-09-28 NOTE — Discharge Instructions (Signed)
Start prednisone 20 mg for 4 days. Monitor blood glucose closely. Follow-up with your primary care doctor/orthopedic for further management of her chronic leg pain.

## 2016-09-28 NOTE — Telephone Encounter (Signed)
I called pt and advised that she finished her pred taper and that she has rx for naprosyn. Her appt with Dr. Ernestina Patches has been moved up to this coming Tuesday for ESI. Can not give anything else at this time.

## 2016-10-02 ENCOUNTER — Ambulatory Visit (INDEPENDENT_AMBULATORY_CARE_PROVIDER_SITE_OTHER): Payer: Medicare HMO

## 2016-10-02 ENCOUNTER — Ambulatory Visit (INDEPENDENT_AMBULATORY_CARE_PROVIDER_SITE_OTHER): Payer: Medicare HMO | Admitting: Physical Medicine and Rehabilitation

## 2016-10-02 DIAGNOSIS — M25551 Pain in right hip: Secondary | ICD-10-CM

## 2016-10-02 NOTE — Progress Notes (Unsigned)
Fluoro Time: 9 sec mGy: 6.24

## 2016-10-02 NOTE — Progress Notes (Signed)
Jacqueline Reese - 61 y.o. female MRN [id number]  Date of birth: 1955/05/22  Office Visit Note: Visit Date: 10/02/2016 PCP: Nolene Ebbs, MD Referred by: Nolene Ebbs, MD  Subjective: Chief Complaint  Patient presents with  . Right Hip - Pain   HPI: Jacqueline Reese is a 61 year old female with severe right hip osteoarthritis and right hip and groin pain. She reports several months of worsening severe pain. Her history is complicated by schizophrenia and chronic pain syndrome. She was recently seen by Dondra Prader, FN-P who requested diagnostic of therapeutic right hip anesthetic arthrogram.    ROS Otherwise per HPI.  Assessment & Plan: Visit Diagnoses:  1. Pain in right hip     Plan: Findings:  Diagnostic of therapeutic right hip anesthetic arthrogram fluoroscopic guidance. Patient did get relief during the anesthetic phase of the injection. The hip joint was so tight that really well with probably got about 2-1/2 mL of fluid into the joint. I did inject some of the injectate around the joint capsule.    Meds & Orders: No orders of the defined types were placed in this encounter.   Orders Placed This Encounter  Procedures  . Large Joint Injection/Arthrocentesis  . XR C-ARM NO REPORT    Follow-up: Return if symptoms worsen or fail to improve.   Procedures: Large Joint Inj Date/Time: 10/02/2016 1:07 PM Performed by: Magnus Sinning Authorized by: Magnus Sinning   Consent Given by:  Patient Site marked: the procedure site was marked   Timeout: prior to procedure the correct patient, procedure, and site was verified   Indications:  Pain and diagnostic evaluation Location:  Hip Site:  R hip joint Prep: patient was prepped and draped in usual sterile fashion   Needle Size:  22 G Approach:  Anterior Ultrasound Guidance: No   Fluoroscopic Guidance: No   Arthrogram: Yes   Medications:  3 mL bupivacaine 0.5 %; 80 mg triamcinolone acetonide 40 MG/ML Aspiration  Attempted: Yes   Patient tolerance:  Patient tolerated the procedure well with no immediate complications  Arthrogram demonstrated excellent flow of contrast throughout the joint surface without extravasation or obvious defect.   Patient did get relief during the anesthetic phase of the injection. The hip joint was so tight that really well with probably got about 2-1/2 mL of fluid into the joint. I did inject some of the injectate around the joint capsule.      No notes on file   Clinical History: No specialty comments available.  She reports that she has been smoking Cigarettes.  She has been smoking about 0.33 packs per day. She has never used smokeless tobacco. No results for input(s): HGBA1C, LABURIC in the last 8760 hours.  Objective:  VS:  HT:    WT:   BMI:     BP:   HR: bpm  TEMP: ( )  RESP:  Physical Exam  Musculoskeletal:  Patient is seated in wheelchair today and is slow to rise from seated position. She does have painful range of motion of the right hip.    Ortho Exam Imaging: Xr C-arm No Report  Result Date: 10/02/2016 Please see Notes or Procedures tab for imaging impression.   Past Medical/Family/Surgical/Social History: Medications & Allergies reviewed per EMR Patient Active Problem List   Diagnosis Date Noted  . Angioedema 09/13/2015  . Anaphylaxis   . Hypertension, essential   . Acute hypoxemic respiratory failure ([place])   . Type 2 diabetes mellitus without complication, without long-term current use  of insulin ([place])   . DIAB W/O MENTION COMP TYPE II/UNS TYPE UNCNTRL 01/19/2010  . FOOT ULCER, RIGHT 01/19/2010  . MENOPAUSE-RELATED VASOMOTOR SYMPTOMS, HOT FLASHES 10/04/2009  . GOUT, UNSPECIFIED 06/07/2009  . LICHEN SIMPLEX CHRONICUS 11/30/2008  . OTITIS EXTERNA, CHRONIC 04/16/2008  . OBESITY, NOS 05/02/2006  . SCHIZOPHRENIA 05/02/2006  . HYPERTENSION, BENIGN SYSTEMIC 05/02/2006  . GASTROESOPHAGEAL REFLUX, NO ESOPHAGITIS 05/02/2006   Past Medical  History:  Diagnosis Date  . Arthritis   . Chronic back pain   . Depression   . Diabetes mellitus without complication ([place])   . Gout   . Hypertension   . Schizophrenic disorder ([place])    No family history on file. Past Surgical History:  Procedure Laterality Date  . CESAREAN SECTION     x2  . NASAL ENDOSCOPY Left 09/13/2015   Procedure: NASAL ENDOSCOPY WITH NASAL INTABATION;  Surgeon: Jerrell Belfast, MD;  Location: [facility];  Service: ENT;  Laterality: Left;   Social History   Occupational History  . Not on file.   Social History Main Topics  . Smoking status: Current Every Day Smoker    Packs/day: 0.33    Types: Cigarettes  . Smokeless tobacco: Never Used  . Alcohol use No  . Drug use: No  . Sexual activity: Not on file

## 2016-10-02 NOTE — Patient Instructions (Signed)

## 2016-10-02 NOTE — Progress Notes (Deleted)
Right and buttock pain for several months. Into groin as well and down front of thigh.

## 2016-10-03 MED ORDER — BUPIVACAINE HCL 0.5 % IJ SOLN
3.0000 mL | INTRAMUSCULAR | Status: AC | PRN
  Administered 2016-10-02: 3 mL via INTRA_ARTICULAR

## 2016-10-03 MED ORDER — TRIAMCINOLONE ACETONIDE 40 MG/ML IJ SUSP
80.0000 mg | INTRAMUSCULAR | Status: AC | PRN
  Administered 2016-10-02: 80 mg via INTRA_ARTICULAR

## 2016-10-04 ENCOUNTER — Telehealth (INDEPENDENT_AMBULATORY_CARE_PROVIDER_SITE_OTHER): Payer: Self-pay | Admitting: Radiology

## 2016-10-04 ENCOUNTER — Telehealth (INDEPENDENT_AMBULATORY_CARE_PROVIDER_SITE_OTHER): Payer: Self-pay | Admitting: Orthopedic Surgery

## 2016-10-04 NOTE — Telephone Encounter (Signed)
Patient called and left voicemail advising that the injection she had 2 days ago is not working. Advised her to give some more time for this to work. Advised to follow up with her medical doctor about her chronic pain. I advised that unfortunately we cannot prescribe her medication for her chronic pain. She has been given prednisone and injection with Dr. Ernestina Patches and has seen urgent care and got additional 4 day supply of prednisone. Advised that we cannot write her for steroid chronically either, that this can effect her joints. She has declined a pain management referral at this time.

## 2016-10-04 NOTE — Telephone Encounter (Signed)
Patient called left voicemail message stating she is in a lot of pain and the medicine is not working. The number to contact patient is [phone]

## 2016-10-08 ENCOUNTER — Ambulatory Visit (INDEPENDENT_AMBULATORY_CARE_PROVIDER_SITE_OTHER): Payer: Medicare HMO | Admitting: Physical Medicine and Rehabilitation

## 2016-10-15 ENCOUNTER — Telehealth (INDEPENDENT_AMBULATORY_CARE_PROVIDER_SITE_OTHER): Payer: Self-pay | Admitting: Orthopedic Surgery

## 2016-10-15 NOTE — Telephone Encounter (Signed)
G[facility] Stabs from [facility] requests verbal for 2x wk for 1 and 1x wk for a second week.   [phone]

## 2016-10-16 NOTE — Telephone Encounter (Signed)
I called and spoke with Gerald Stabs to give verbal ok for home health therapy continuation.

## 2016-10-18 ENCOUNTER — Telehealth (INDEPENDENT_AMBULATORY_CARE_PROVIDER_SITE_OTHER): Payer: Self-pay | Admitting: Orthopedic Surgery

## 2016-10-18 NOTE — Telephone Encounter (Signed)
Senaida Ores with [facility] called advised he is going to recertify patient  And will need verbal orders for [facility] (PT) to work on balance and gait. The number to contact Gerald Stabs is [phone]

## 2016-10-18 NOTE — Telephone Encounter (Signed)
I called and spoke with Gerald Stabs to give verbal ok.

## 2016-10-22 ENCOUNTER — Encounter (INDEPENDENT_AMBULATORY_CARE_PROVIDER_SITE_OTHER): Payer: Self-pay | Admitting: Orthopedic Surgery

## 2016-10-22 ENCOUNTER — Ambulatory Visit (INDEPENDENT_AMBULATORY_CARE_PROVIDER_SITE_OTHER): Payer: Medicare HMO | Admitting: Orthopedic Surgery

## 2016-10-22 VITALS — BP 110/77 | HR 97 | Temp 97.9°F | Ht 62.0 in | Wt 143.0 lb

## 2016-10-22 DIAGNOSIS — E1142 Type 2 diabetes mellitus with diabetic polyneuropathy: Secondary | ICD-10-CM

## 2016-10-22 DIAGNOSIS — M25561 Pain in right knee: Secondary | ICD-10-CM

## 2016-10-22 DIAGNOSIS — G8929 Other chronic pain: Secondary | ICD-10-CM

## 2016-10-22 DIAGNOSIS — M5410 Radiculopathy, site unspecified: Secondary | ICD-10-CM

## 2016-10-22 MED ORDER — NAPROXEN 500 MG PO TABS: 500.0000 mg | ORAL_TABLET | Freq: Two times a day (BID) | ORAL | 0 refills | Status: DC | PRN

## 2016-10-22 MED ORDER — PREDNISONE 10 MG PO TABS: 10.0000 mg | ORAL_TABLET | Freq: Every day | ORAL | 0 refills | Status: DC

## 2016-10-22 NOTE — Progress Notes (Signed)
Office Visit Note   Patient: Jacqueline Reese           Date of Birth: 05/20/1955           MRN: [id number] Visit Date: 10/22/2016              Requested by: Nolene Ebbs, MD [address] [place], [place] [address] PCP: Nolene Ebbs, MD  Chief Complaint  Patient presents with  . Right Leg - Follow-up      HPI: Patient presents in follow-up she is status post radicular symptoms and had epidural steroid injection with Dr. Ernestina Patches she states that this did help she states the prednisone also helped. Patient states she has chronic pain syndrome she complains of multiple aches and pains in the muscles throughout her body. Patient requests Vicodin for pain treatment. Patient is still doing home health physical therapy.  Assessment & Plan: Visit Diagnoses:  1. Radicular syndrome of right lower extremity   2. Chronic pain of right knee   3. Diabetic polyneuropathy associated with type 2 diabetes mellitus ([place])     Plan: Recommend patient continue with her home health physical therapy for strengthening. Recommended against narcotics for her chronic pain. A prescription was called in for prednisone for breakthrough pain and a refill prescription for her Naprosyn.  Follow-Up Instructions: Return if symptoms worsen or fail to improve.   Ortho Exam  Patient is alert, oriented, no adenopathy, well-dressed, normal affect, normal respiratory effort. Examination patient am going to the wheelchair. She has active hip flexion and extension and knee flexion and extension plantarflexion and dorsiflexion with no focal weakness she has a negative sciatic tension sign bilaterally.  Imaging: No results found. No images are attached to the encounter.  Labs: Lab Results  Component Value Date   HGBA1C 8.4 01/19/2010   LABURIC 5.9 07/08/2009   LABURIC 9.9 (H) 06/06/2009   REPTSTATUS 06/04/2010 FINAL 06/02/2010   CULT  06/02/2010    GROUP B STREP(S.AGALACTIAE)ISOLATED Note: TESTING  AGAINST S. AGALACTIAE NOT ROUTINELY PERFORMED DUE TO PREDICTABILITY OF AMP/PEN/VAN SUSCEPTIBILITY.    Orders:  No orders of the defined types were placed in this encounter.  Meds ordered this encounter  Medications  . naproxen (NAPROSYN) 500 MG tablet    Sig: Take 1 tablet (500 mg total) by mouth 2 (two) times daily as needed for mild pain or moderate pain. With meals    Dispense:  60 tablet    Refill:  0  . predniSONE (DELTASONE) 10 MG tablet    Sig: Take 1 tablet (10 mg total) by mouth daily with breakfast.    Dispense:  20 tablet    Refill:  0     Procedures: No procedures performed  Clinical Data: No additional findings.  ROS:  All other systems negative, except as noted in the HPI. Review of Systems  Objective: Vital Signs: BP 110/77 (BP Location: Left Arm, Patient Position: Sitting, Cuff Size: Normal)   Pulse 97   Temp 97.9 F (36.6 C) (Oral)   Ht 5\' 2"  (1.575 m)   Wt 143 lb (64.9 kg)   LMP 05/09/2010   BMI 26.16 kg/m   Specialty Comments:  No specialty comments available.  PMFS History: Patient Active Problem List   Diagnosis Date Noted  . Angioedema 09/13/2015  . Anaphylaxis   . Hypertension, essential   . Acute hypoxemic respiratory failure ([place])   . Type 2 diabetes mellitus without complication, without long-term current use of insulin ([place])   . DIAB  W/O MENTION COMP TYPE II/UNS TYPE UNCNTRL 01/19/2010  . FOOT ULCER, RIGHT 01/19/2010  . MENOPAUSE-RELATED VASOMOTOR SYMPTOMS, HOT FLASHES 10/04/2009  . GOUT, UNSPECIFIED 06/07/2009  . LICHEN SIMPLEX CHRONICUS 11/30/2008  . OTITIS EXTERNA, CHRONIC 04/16/2008  . OBESITY, NOS 05/02/2006  . SCHIZOPHRENIA 05/02/2006  . HYPERTENSION, BENIGN SYSTEMIC 05/02/2006  . GASTROESOPHAGEAL REFLUX, NO ESOPHAGITIS 05/02/2006   Past Medical History:  Diagnosis Date  . Arthritis   . Chronic back pain   . Depression   . Diabetes mellitus without complication ([place])   . Gout   . Hypertension   . Schizophrenic  disorder ([place])     History reviewed. No pertinent family history.  Past Surgical History:  Procedure Laterality Date  . CESAREAN SECTION     x2  . NASAL ENDOSCOPY Left 09/13/2015   Procedure: NASAL ENDOSCOPY WITH NASAL INTABATION;  Surgeon: Jerrell Belfast, MD;  Location: [facility] OR;  Service: ENT;  Laterality: Left;   Social History   Occupational History  . Not on file.   Social History Main Topics  . Smoking status: Current Every Day Smoker    Packs/day: 0.33    Types: Cigarettes  . Smokeless tobacco: Never Used  . Alcohol use No  . Drug use: No  . Sexual activity: Not on file

## 2016-10-26 ENCOUNTER — Telehealth (INDEPENDENT_AMBULATORY_CARE_PROVIDER_SITE_OTHER): Payer: Self-pay | Admitting: Orthopedic Surgery

## 2016-10-26 NOTE — Telephone Encounter (Signed)
Patient called asked what can she take for stiff joints. Patient said her legs ache when she does (PT) and when she walk. The number to contact patient is [phone]

## 2016-10-26 NOTE — Telephone Encounter (Signed)
Pt is receiving  Home physical therapy and has been given rx for naprosyn and prednisone for break through pain at visit on Monday. Pt states the cold just bothers her. She will continue with plan and call with any questions.

## 2016-11-02 ENCOUNTER — Telehealth (INDEPENDENT_AMBULATORY_CARE_PROVIDER_SITE_OTHER): Payer: Self-pay | Admitting: Radiology

## 2016-11-02 NOTE — Telephone Encounter (Signed)
Patient called yesterday LMVM and wanted to know if she can have more medication?

## 2016-11-07 ENCOUNTER — Telehealth (INDEPENDENT_AMBULATORY_CARE_PROVIDER_SITE_OTHER): Payer: Self-pay | Admitting: Orthopedic Surgery

## 2016-11-07 NOTE — Telephone Encounter (Signed)
Patient called left voicemail message needing refill on pain medicine. The number to contact patient is [phone]

## 2016-11-07 NOTE — Telephone Encounter (Signed)
Patient has been told multiple times she cannot get medication. Already given prednisone and NSAIDS.

## 2016-11-08 MED ORDER — NAPROXEN 500 MG PO TABS: 500.0000 mg | ORAL_TABLET | Freq: Two times a day (BID) | ORAL | 0 refills | Status: DC | PRN

## 2016-11-08 NOTE — Telephone Encounter (Signed)
Call patient she should not need any more prednisone.

## 2016-11-08 NOTE — Telephone Encounter (Signed)
I called and spoke with patient advise no narcotics for chronic pain and no prednisone she just had prednisone. Sent in refill for naproxen.

## 2016-11-26 ENCOUNTER — Ambulatory Visit: Payer: Medicare HMO | Admitting: Podiatry

## 2016-12-07 ENCOUNTER — Telehealth (INDEPENDENT_AMBULATORY_CARE_PROVIDER_SITE_OTHER): Payer: Self-pay

## 2016-12-07 DIAGNOSIS — R29898 Other symptoms and signs involving the musculoskeletal system: Secondary | ICD-10-CM

## 2016-12-07 DIAGNOSIS — R2689 Other abnormalities of gait and mobility: Secondary | ICD-10-CM

## 2016-12-07 NOTE — Telephone Encounter (Signed)
Faxed updated order to [facility].

## 2016-12-07 NOTE — Telephone Encounter (Signed)
Patient would like to have 3 more weeks of physical therapy with [facility] for her left leg.  Stated that her right leg is doing fine.  Cb# is [phone].  Please advise.  Thank You.

## 2016-12-11 ENCOUNTER — Telehealth (INDEPENDENT_AMBULATORY_CARE_PROVIDER_SITE_OTHER): Payer: Self-pay | Admitting: Orthopedic Surgery

## 2016-12-11 MED ORDER — NAPROXEN 500 MG PO TABS: 500.0000 mg | ORAL_TABLET | Freq: Two times a day (BID) | ORAL | 0 refills | Status: DC

## 2016-12-11 NOTE — Telephone Encounter (Signed)
Patient called asking for a refill on prednisone and another pain medication through [facility]. CB # [phone]

## 2016-12-11 NOTE — Addendum Note (Signed)
Addended by: Maxcine Ham on: 12/11/2016 03:19 PM   Modules accepted: Orders

## 2016-12-11 NOTE — Telephone Encounter (Signed)
Advised that rx for prednisone again. Declined she had 3 courses of prednisone less than 3 months ago. Fearful of joint degeneration, advised her we could refill naproxen. This was sent into [facility] speciality pharmacy.

## 2016-12-18 ENCOUNTER — Telehealth (INDEPENDENT_AMBULATORY_CARE_PROVIDER_SITE_OTHER): Payer: Self-pay | Admitting: Orthopedic Surgery

## 2016-12-18 ENCOUNTER — Other Ambulatory Visit (INDEPENDENT_AMBULATORY_CARE_PROVIDER_SITE_OTHER): Payer: Self-pay | Admitting: Orthopedic Surgery

## 2016-12-18 NOTE — Telephone Encounter (Signed)
Patient called asking for a refill on her Naproxen. CB # [phone]

## 2016-12-18 NOTE — Telephone Encounter (Signed)
I called and left voicemail for message naproxen rx refill sent to [facility] per her request last week. If this has changed let us know and identify the new pharmacy she wants this sent to.

## 2016-12-18 NOTE — Telephone Encounter (Signed)
Patient called left voicemail message needing refill (Prednisone) Patient advised she had to go to the hospital. Patient advised she need 5 tabs. Patient said not to worry about the naproxen. Patient advised the Tylenol is not working. Patient said she really do need help. The number to contact patient is [phone]

## 2016-12-18 NOTE — Telephone Encounter (Signed)
I called patient again advised again that Dr. Sharol Given will not refill prednisone. I had long discussion about this with her again. She will call for an appointment she states she is busy now.

## 2016-12-20 ENCOUNTER — Other Ambulatory Visit (INDEPENDENT_AMBULATORY_CARE_PROVIDER_SITE_OTHER): Payer: Self-pay | Admitting: Orthopedic Surgery

## 2016-12-23 ENCOUNTER — Ambulatory Visit (HOSPITAL_COMMUNITY)
Admission: EM | Admit: 2016-12-23 | Discharge: 2016-12-23 | Disposition: A | Discharge status: Home / Self Care | Payer: Medicare HMO | Attending: Emergency Medicine | Admitting: Emergency Medicine

## 2016-12-23 ENCOUNTER — Encounter (HOSPITAL_COMMUNITY): Payer: Self-pay | Admitting: Emergency Medicine

## 2016-12-23 DIAGNOSIS — M79605 Pain in left leg: Secondary | ICD-10-CM | POA: Diagnosis not present

## 2016-12-23 DIAGNOSIS — M79604 Pain in right leg: Secondary | ICD-10-CM

## 2016-12-23 MED ORDER — MELOXICAM 7.5 MG PO TABS: 7.5000 mg | ORAL_TABLET | Freq: Every day | ORAL | 0 refills | Status: DC

## 2016-12-23 MED ORDER — PREDNISONE 10 MG PO TABS: 20.0000 mg | ORAL_TABLET | Freq: Every day | ORAL | 0 refills | Status: AC

## 2016-12-23 NOTE — ED Provider Notes (Signed)
[facility]    CSN: [id number] Arrival date & time: 12/23/16  1428     History   Chief Complaint Chief Complaint  Patient presents with  . Leg Pain    HPI Jacqueline Reese is a 61 y.o. female.   Jonelle presents with complaints of left leg pain. It is painful from her thigh to her ankle. She is in a wheelchair in room due to pain with ambulation. It is worse with activity. No known injury. She states she has chronic arthritis pain and this feels similar but is worse than usual. She is currently receiving physical therapy to improve activity. There is no redness swelling or warmth. She took tylenol yesterday which did not help. She stopped taking naproxen as she feels it did not help. Rates her pain 8/10, achy and sharp in nature.       Past Medical History:  Diagnosis Date  . Arthritis   . Chronic back pain   . Depression   . Diabetes mellitus without complication ([place])   . Gout   . Hypertension   . Schizophrenic disorder [facility])     Patient Active Problem List   Diagnosis Date Noted  . Angioedema 09/13/2015  . Anaphylaxis   . Hypertension, essential   . Acute hypoxemic respiratory failure ([place])   . Type 2 diabetes mellitus without complication, without long-term current use of insulin ([place])   . DIAB W/O MENTION COMP TYPE II/UNS TYPE UNCNTRL 01/19/2010  . FOOT ULCER, RIGHT 01/19/2010  . MENOPAUSE-RELATED VASOMOTOR SYMPTOMS, HOT FLASHES 10/04/2009  . GOUT, UNSPECIFIED 06/07/2009  . LICHEN SIMPLEX CHRONICUS 11/30/2008  . OTITIS EXTERNA, CHRONIC 04/16/2008  . OBESITY, NOS 05/02/2006  . SCHIZOPHRENIA 05/02/2006  . HYPERTENSION, BENIGN SYSTEMIC 05/02/2006  . GASTROESOPHAGEAL REFLUX, NO ESOPHAGITIS 05/02/2006    Past Surgical History:  Procedure Laterality Date  . CESAREAN SECTION     x2  . NASAL ENDOSCOPY Left 09/13/2015   Procedure: NASAL ENDOSCOPY WITH NASAL INTABATION;  Surgeon: Jerrell Belfast, MD;  Location: [place];  Service: ENT;  Laterality:  Left;    OB History    No data available       Home Medications    Prior to Admission medications   Medication Sig Start Date End Date Taking? Authorizing Provider  Acetaminophen 500 MG coapsule  09/18/16  Yes [provider]  amLODipine (NORVASC) 5 MG tablet Take 1 tablet (5 mg total) by mouth daily. 09/18/15  Yes Whiteheart, Cristal Ford, NP  aspirin EC 325 MG tablet Take 325 mg by mouth daily.   Yes [provider]  clonazePAM (KLONOPIN) 1 MG tablet Take 1 mg by mouth 2 (two) times daily.    Yes [provider]  famotidine (PEPCID) 40 MG tablet Take 1 tablet (40 mg total) by mouth daily. 09/18/15  Yes Whiteheart, Cristal Ford, NP  gabapentin (NEURONTIN) 100 MG capsule Take 1 capsule (100 mg total) by mouth 3 (three) times daily. Patient taking differently: Take 300 mg by mouth 2 (two) times daily.  04/13/16  Yes Robyn Haber, MD  gabapentin (NEURONTIN) 300 MG capsule Take 300 mg by mouth 3 (three) times daily. 06/05/16  Yes [provider]  glipiZIDE (GLUCOTROL XL) 2.5 MG 24 hr tablet Take 2.5 mg by mouth daily.   Yes [provider]  metFORMIN (GLUCOPHAGE) 500 MG tablet Take 500 mg by mouth 2 (two) times daily with a meal.   Yes [provider]  naproxen (NAPROSYN) 500 MG tablet take 1 tablet  by mouth twice a day if needed for pain with meals 09/24/16  Yes Dondra Prader R, NP  naproxen (NAPROSYN) 500 MG tablet Take 1 tablet (500 mg total) by mouth 2 (two) times daily. 12/11/16  Yes Newt Minion, MD  spironolactone (ALDACTONE) 50 MG tablet Take 50 mg by mouth daily.   Yes [provider]  ziprasidone (GEODON) 80 MG capsule Take 240 mg by mouth every evening. 07/13/15  Yes [provider]  albuterol (PROVENTIL HFA;VENTOLIN HFA) 108 (90 BASE) MCG/ACT inhaler Inhale 2 puffs into the lungs every 4 (four) hours as needed for wheezing or shortness of breath. 05/09/13   Janne Napoleon, NP  allopurinol (ZYLOPRIM) 100 MG tablet Take 100 mg  by mouth 2 (two) times daily.     [provider]  bacitracin ointment Apply 1 application topically 2 (two) times daily. 02/05/16   Waynetta Pean, PA-C  buPROPion (WELLBUTRIN XL) 150 MG 24 hr tablet Take 150 mg by mouth daily.    [provider]  cyclobenzaprine (FLEXERIL) 10 MG tablet Take 10 mg by mouth 3 (three) times daily as needed for muscle spasms.    [provider]  Diclofenac Sodium 3 % GEL Place 1 application onto the skin 2 (two) times daily. To affected area. 02/05/16   Waynetta Pean, PA-C  diphenhydrAMINE (BENADRYL) 25 mg capsule Take 1 capsule (25 mg total) by mouth 2 (two) times daily. 09/18/15 09/21/15  Whiteheart, Cristal Ford, NP  escitalopram (LEXAPRO) 20 MG tablet Take 20 mg by mouth daily.     [provider]  HYDROcodone-acetaminophen (NORCO/VICODIN) 5-325 MG tablet Take 1 tablet by mouth every 4 (four) hours as needed. 05/05/16   Janne Napoleon, NP  meloxicam (MOBIC) 7.5 MG tablet Take 1 tablet (7.5 mg total) by mouth daily. 12/23/16   Augusto Gamble B, NP  naproxen (NAPROSYN) 500 MG tablet TAKE 1 TABLET TWO TIMES DAILY BY MOUTH AS NEEDED FOR MILD PAIN OR MODERATE PAIN 12/18/16   Newt Minion, MD  predniSONE (DELTASONE) 10 MG tablet Take 2 tablets (20 mg total) by mouth daily with breakfast. 12/23/16 12/27/16  Zigmund Gottron, NP  VOLTAREN 1 % GEL  09/13/16   [provider]    Family History History reviewed. No pertinent family history.  Social History Social History  Substance Use Topics  . Smoking status: Current Every Day Smoker    Packs/day: 0.33    Types: Cigarettes  . Smokeless tobacco: Never Used  . Alcohol use No     Allergies   Lisinopril; Chantix [varenicline]; Doxycycline; Ibuprofen; Lamisil [terbinafine hcl]; Prednisone; Cephalexin; and Sulfa antibiotics   Review of Systems Review of Systems  Constitutional: Negative.   Respiratory: Negative.   Genitourinary: Negative.   Musculoskeletal: Positive for  arthralgias and myalgias. Negative for back pain and joint swelling.  Skin: Negative.      Physical Exam Triage Vital Signs ED Triage Vitals  Enc Vitals Group     BP 12/23/16 1510 112/78     Pulse Rate 12/23/16 1510 88     Resp --      Temp 12/23/16 1510 98.9 F (37.2 C)     Temp Source 12/23/16 1510 Oral     SpO2 12/23/16 1510 97 %     Weight --      Height --      Head Circumference --      Peak Flow --      Pain Score 12/23/16 1512 7  Pain Loc --      Pain Edu? --      Excl. in [place]? --    No data found.   Updated Vital Signs BP 112/78 (BP Location: Left Arm)   Pulse 88   Temp 98.9 F (37.2 C) (Oral)   LMP 05/09/2010   SpO2 97%   Visual Acuity Right Eye Distance:   Left Eye Distance:   Bilateral Distance:    Right Eye Near:   Left Eye Near:    Bilateral Near:     Physical Exam  Constitutional: She appears well-developed and well-nourished.  Eyes: Pupils are equal, round, and reactive to light.  Cardiovascular: Normal rate and regular rhythm.   Pulmonary/Chest: Effort normal and breath sounds normal.  Musculoskeletal:       Left knee: She exhibits decreased range of motion. She exhibits no swelling, no effusion, no deformity and no erythema.       Left ankle: She exhibits decreased range of motion. She exhibits no swelling, no ecchymosis and no deformity.  Generalized pain to left thigh knee and ankle; non specific in nature; strength equal to bilateral extremities; pedal pulses strong and equal; pain with knee and ankle flexion   Skin: Skin is warm and dry.     UC Treatments / Results  Labs (all labs ordered are listed, but only abnormal results are displayed) Labs Reviewed - No data to display  EKG  EKG Interpretation None       Radiology No results found.  Procedures Procedures (including critical care time)  Medications Ordered in UC Medications - No data to display   Initial Impression / Assessment and Plan / UC Course  I have  reviewed the triage vital signs and the nursing notes.  Pertinent labs & imaging results that were available during my care of the patient were reviewed by me and considered in my medical decision making (see chart for details).     Non specific exam findings with indications of acute changes to left leg. Prednisone has helped in the past, repeat of small course which has previously been helpful, patient aware to monitor blood sugars. Will try meloxicam as naproxen did not help. To continue with P/T and follow with PCP for continued management.   Final Clinical Impressions(s) / UC Diagnoses   Final diagnoses:  Left leg pain  Bilateral leg pain    New Prescriptions Discharge Medication List as of 12/23/2016  4:10 PM    START taking these medications   Details  meloxicam (MOBIC) 7.5 MG tablet Take 1 tablet (7.5 mg total) by mouth daily., Starting Sun 12/23/2016, Normal         Controlled Substance Prescriptions [place] Controlled Substance Registry consulted? Not Applicable   Zigmund Gottron, NP 12/23/16 1724

## 2016-12-23 NOTE — ED Triage Notes (Signed)
Pt c/o intermittent LLE pain onset 2 weeks  Denies inj/trauma  Hx of arthritis  Taking Naproxen w/no relief.   Pain increases w/activity... Brought back on wheel chair.

## 2016-12-27 ENCOUNTER — Telehealth (INDEPENDENT_AMBULATORY_CARE_PROVIDER_SITE_OTHER): Payer: Self-pay | Admitting: Orthopedic Surgery

## 2016-12-27 ENCOUNTER — Encounter (INDEPENDENT_AMBULATORY_CARE_PROVIDER_SITE_OTHER): Payer: Self-pay | Admitting: Orthopedic Surgery

## 2016-12-27 ENCOUNTER — Ambulatory Visit (INDEPENDENT_AMBULATORY_CARE_PROVIDER_SITE_OTHER): Payer: Medicare HMO | Admitting: Orthopedic Surgery

## 2016-12-27 DIAGNOSIS — R29898 Other symptoms and signs involving the musculoskeletal system: Secondary | ICD-10-CM

## 2016-12-27 DIAGNOSIS — E1142 Type 2 diabetes mellitus with diabetic polyneuropathy: Secondary | ICD-10-CM | POA: Diagnosis not present

## 2016-12-27 DIAGNOSIS — M79604 Pain in right leg: Secondary | ICD-10-CM

## 2016-12-27 DIAGNOSIS — M79605 Pain in left leg: Secondary | ICD-10-CM

## 2016-12-27 NOTE — Progress Notes (Signed)
Office Visit Note   Patient: Jacqueline Reese           Date of Birth: 09-Feb-1956           MRN: [id number] Visit Date: 12/27/2016              Requested by: Nolene Ebbs, MD [address] [place], [place] [address] PCP: Nolene Ebbs, MD  Chief Complaint  Patient presents with  . Left Leg - Pain      HPI: Patient presents complaining of bilateral leg pain from her knees to her ankles.  She describes this as part of her diabetic neuropathy pain.  She states she is working with therapy at home and is improving her gait.  Patient states that the Naprosyn does help she is taking this twice a day.  She states she also takes 100 mg of Neurontin 3 times a day and this is not helping much.  Assessment & Plan: Visit Diagnoses:  1. Diabetic polyneuropathy associated with type 2 diabetes mellitus ([place])     Plan: Recommended that she continue with her home health physical therapy and a prescription was provided for this.  She states she does have a follow-up appointment with her primary care physician and will discuss increasing her Neurontin.  Patient has also had a high uric acid level in the past there is no signs or symptoms of gout at this time.  Patient requests narcotic pain medicine for her leg pain and I discussed that this would not be appropriate to treat her leg pain with narcotic pain medication.  Follow-Up Instructions: Return if symptoms worsen or fail to improve.   Ortho Exam  Patient is alert, oriented, no adenopathy, well-dressed, normal affect, normal respiratory effort. Examination patient is ambulating in a wheelchair and a 4 pronged cane.  She has a negative straight leg raise bilaterally there is no focal motor weakness in either lower extremity.  She has no radicular symptoms.  The calf is soft nontender no pain with passive range of motion of the ankle no evidence of a DVT.  She has some mild pitting edema in both lower extremities but no significant swelling.   She has a good posterior tibial pulse bilaterally with no signs of arterial insufficiency.  Imaging: No results found. No images are attached to the encounter.  Labs: Lab Results  Component Value Date   HGBA1C 8.4 01/19/2010   LABURIC 5.9 07/08/2009   LABURIC 9.9 (H) 06/06/2009   REPTSTATUS 06/04/2010 FINAL 06/02/2010   CULT  06/02/2010    GROUP B STREP(S.AGALACTIAE)ISOLATED Note: TESTING AGAINST S. AGALACTIAE NOT ROUTINELY PERFORMED DUE TO PREDICTABILITY OF AMP/PEN/VAN SUSCEPTIBILITY.    Orders:  No orders of the defined types were placed in this encounter.  No orders of the defined types were placed in this encounter.    Procedures: No procedures performed  Clinical Data: No additional findings.  ROS:  All other systems negative, except as noted in the HPI. Review of Systems  Objective: Vital Signs: LMP 05/09/2010   Specialty Comments:  No specialty comments available.  PMFS History: Patient Active Problem List   Diagnosis Date Noted  . Angioedema 09/13/2015  . Anaphylaxis   . Hypertension, essential   . Acute hypoxemic respiratory failure ([place])   . Type 2 diabetes mellitus without complication, without long-term current use of insulin ([place])   . DIAB W/O MENTION COMP TYPE II/UNS TYPE UNCNTRL 01/19/2010  . FOOT ULCER, RIGHT 01/19/2010  . MENOPAUSE-RELATED VASOMOTOR SYMPTOMS, HOT FLASHES  10/04/2009  . GOUT, UNSPECIFIED 06/07/2009  . LICHEN SIMPLEX CHRONICUS 11/30/2008  . OTITIS EXTERNA, CHRONIC 04/16/2008  . OBESITY, NOS 05/02/2006  . SCHIZOPHRENIA 05/02/2006  . HYPERTENSION, BENIGN SYSTEMIC 05/02/2006  . GASTROESOPHAGEAL REFLUX, NO ESOPHAGITIS 05/02/2006   Past Medical History:  Diagnosis Date  . Arthritis   . Chronic back pain   . Depression   . Diabetes mellitus without complication ([place])   . Gout   . Hypertension   . Schizophrenic disorder ([place])     History reviewed. No pertinent family history.  Past Surgical History:  Procedure Laterality  Date  . CESAREAN SECTION     x2  . NASAL ENDOSCOPY Left 09/13/2015   Procedure: NASAL ENDOSCOPY WITH NASAL INTABATION;  Surgeon: Jerrell Belfast, MD;  Location: [facility];  Service: ENT;  Laterality: Left;   Social History   Occupational History  . Not on file.   Social History Main Topics  . Smoking status: Current Every Day Smoker    Packs/day: 0.33    Types: Cigarettes  . Smokeless tobacco: Never Used  . Alcohol use No  . Drug use: No  . Sexual activity: Not on file

## 2016-12-27 NOTE — Telephone Encounter (Signed)
Yes, we will not refill, not sure why pharmacy would send to me, and as you know I never do the taper like that

## 2016-12-27 NOTE — Telephone Encounter (Signed)
Not sure why this is still in my inbox. Maybe needs to be refused?

## 2016-12-27 NOTE — Telephone Encounter (Signed)
Patient called asking for her RX that was prescribed to her this afternoon to be resent with her name on it. Fax # [phone]

## 2016-12-28 ENCOUNTER — Telehealth (INDEPENDENT_AMBULATORY_CARE_PROVIDER_SITE_OTHER): Payer: Self-pay | Admitting: Orthopedic Surgery

## 2016-12-28 NOTE — Telephone Encounter (Signed)
I've already had a thorough discussion about this to the patient this morning. I told her numerous times over the phone repetitively that I would fax a new referral to [facility].

## 2016-12-28 NOTE — Telephone Encounter (Signed)
I called and spoke left vm for Dr. Eddie Dibbles nurse advised no reason from orthopedic standpoint patients gabapentin cannot be increased but does feel like whoever is prescribing this for her needs to make this decision and not our office. This was explained to the patient repetitively over the phone as well. Referral faxed to [facility] for continuation of physical therapy.

## 2016-12-28 NOTE — Telephone Encounter (Signed)
I spoke with pt,  and she stated that she wanted to put in verbal orders to continue therapy for additional 8 weeks. I spoke with [facility] staff and she was discharged two weeks ago.I'm not sure exactly what this means because normally the pt puts in verbal orders.

## 2016-12-28 NOTE — Telephone Encounter (Signed)
They may increase her neurontin

## 2016-12-28 NOTE — Addendum Note (Signed)
Addended by: Maxcine Ham on: 12/28/2016 09:55 AM   Modules accepted: Orders

## 2016-12-28 NOTE — Telephone Encounter (Signed)
Patient called advised Dr.Duda forgot to sign Rx to continue therapy and put her name on the Rx. Patient said she is having therapy with [facility]. It's suppose to be faxed to [facility].Patient said Dr Rex Kras asked if her Gabapentin could be increased. The phone# to [facility] is [phone]. The number to contact patient is [phone]

## 2017-01-03 ENCOUNTER — Telehealth (INDEPENDENT_AMBULATORY_CARE_PROVIDER_SITE_OTHER): Payer: Self-pay | Admitting: Orthopedic Surgery

## 2017-01-03 ENCOUNTER — Other Ambulatory Visit (INDEPENDENT_AMBULATORY_CARE_PROVIDER_SITE_OTHER): Payer: Self-pay | Admitting: Orthopedic Surgery

## 2017-01-03 MED ORDER — NABUMETONE 750 MG PO TABS: 750.0000 mg | ORAL_TABLET | Freq: Two times a day (BID) | ORAL | 0 refills | Status: DC | PRN

## 2017-01-03 NOTE — Addendum Note (Signed)
Addended by: Maxcine Ham on: 01/03/2017 03:27 PM   Modules accepted: Orders

## 2017-01-03 NOTE — Telephone Encounter (Signed)
Patient called saying she is still experiencing a lot of pain and was asking if she could be prescribed prednisone on more time while she's trying to find another primary care doctor. CB #  [phone]

## 2017-01-03 NOTE — Telephone Encounter (Signed)
I called and advised patient no prednisone, she is wanting relafen, naproxen not working for her. Ok to write for relafen and send to her pharmacy?

## 2017-01-03 NOTE — Telephone Encounter (Signed)
I called left vm to advise ok for verbal order for patients home health.

## 2017-01-03 NOTE — Telephone Encounter (Signed)
Maker,Greidys L 30-Jul-1955    [facility]  Verbal Orders  Darrick Meigs  [phone]  Verbal Orders Twice a week for 8 weeks

## 2017-01-09 ENCOUNTER — Telehealth (INDEPENDENT_AMBULATORY_CARE_PROVIDER_SITE_OTHER): Payer: Self-pay | Admitting: Orthopedic Surgery

## 2017-01-09 NOTE — Telephone Encounter (Signed)
Lets see her in the office Thursday, to see what she needs

## 2017-01-09 NOTE — Telephone Encounter (Signed)
Patient called and said she was feeling better since her last visit so she does not need a cortisone shot. She was calling to update!

## 2017-01-09 NOTE — Telephone Encounter (Signed)
Declining injection now, she is feeling much better, will call us if anything changes.

## 2017-01-15 ENCOUNTER — Telehealth (INDEPENDENT_AMBULATORY_CARE_PROVIDER_SITE_OTHER): Payer: Self-pay | Admitting: Orthopedic Surgery

## 2017-01-15 NOTE — Telephone Encounter (Signed)
Patient called complaining of a lot of pressure in her legs. CB # [phone]

## 2017-01-18 ENCOUNTER — Ambulatory Visit (INDEPENDENT_AMBULATORY_CARE_PROVIDER_SITE_OTHER): Payer: Medicare HMO | Admitting: Family

## 2017-01-18 ENCOUNTER — Encounter (INDEPENDENT_AMBULATORY_CARE_PROVIDER_SITE_OTHER): Payer: Self-pay | Admitting: Family

## 2017-01-18 VITALS — Ht 62.0 in | Wt 143.0 lb

## 2017-01-18 DIAGNOSIS — M79605 Pain in left leg: Secondary | ICD-10-CM

## 2017-01-18 DIAGNOSIS — G6289 Other specified polyneuropathies: Secondary | ICD-10-CM | POA: Diagnosis not present

## 2017-01-18 DIAGNOSIS — M79604 Pain in right leg: Secondary | ICD-10-CM | POA: Diagnosis not present

## 2017-01-18 NOTE — Progress Notes (Signed)
Office Visit Note   Patient: Jacqueline Reese           Date of Birth: [birth year]           MRN: [id number] Visit Date: 01/18/2017              Requested by: Nolene Ebbs, MD [address] [place], [place] [address] PCP: Nolene Ebbs, MD  Chief Complaint  Patient presents with  . Left Leg - Pain  . Right Leg - Pain      HPI: Patient presents complaining of bilateral leg pain from her hips to her knees. Complains of aching and shooting pain to lateral thighs. No numbness tingling or weakness.  Does also complain of her diabetic neuropathy pain to bilateral feet.  She states she is taking Naprosyn twice a day. Also relates taking Gabapentin 300 mg tid, unsure if is getting adequate relief of neuropathic pain with this. States she cannot go on living if has to live with the burning and shooting pain in plantar aspect of bilateral feet.   States unable to walk due to pain. In wheelchair today. Did drive herself and walk in. But prefers to be assisted around clinic and back to car in wheel chair.   Assessment & Plan: Visit Diagnoses:  1. Other polyneuropathy     Plan: Recommended that she continue with her antiinflammatories. Will increase her Neurontin.  Patient requests narcotic pain medicine for her leg pain and I discussed that this would not be appropriate to treat her leg pain with narcotic pain medication.  Follow-Up Instructions: No Follow-up on file.   Ortho Exam  Patient is alert, oriented, no adenopathy, well-dressed, normal affect, normal respiratory effort. Examination patient is ambulating in a wheelchair and a 4 pronged cane. Thigh nontender. She has a negative straight leg raise bilaterally there is no focal motor weakness in either lower extremity.  She has no radicular symptoms.  The calf is soft nontender no pain with passive range of motion of the ankle no evidence of a DVT.  She has some mild pitting edema in both lower extremities but no significant  swelling.  She has a good posterior tibial pulse bilaterally with no signs of arterial insufficiency.  Imaging: No results found. No images are attached to the encounter.  Labs: Lab Results  Component Value Date   HGBA1C 8.4 01/19/2010   LABURIC 5.9 07/08/2009   LABURIC 9.9 (H) 06/06/2009   REPTSTATUS 06/04/2010 FINAL 06/02/2010   CULT  06/02/2010    GROUP B STREP(S.AGALACTIAE)ISOLATED Note: TESTING AGAINST S. AGALACTIAE NOT ROUTINELY PERFORMED DUE TO PREDICTABILITY OF AMP/PEN/VAN SUSCEPTIBILITY.    Orders:  No orders of the defined types were placed in this encounter.  No orders of the defined types were placed in this encounter.    Procedures: No procedures performed  Clinical Data: No additional findings.  ROS:  All other systems negative, except as noted in the HPI. Review of Systems  Constitutional: Negative for chills and fever.  Musculoskeletal: Positive for arthralgias and myalgias.  Skin: Negative for color change.  Neurological: Negative for weakness and numbness.    Objective: Vital Signs: Ht 5\' 2"  (1.575 m)   Wt 143 lb (64.9 kg)   LMP 05/09/2010   BMI 26.16 kg/m   Specialty Comments:  No specialty comments available.  PMFS History: Patient Active Problem List   Diagnosis Date Noted  . Angioedema 09/13/2015  . Anaphylaxis   . Hypertension, essential   . Acute hypoxemic respiratory failure (  [place])   . Type 2 diabetes mellitus without complication, without long-term current use of insulin ([place])   . DIAB W/O MENTION COMP TYPE II/UNS TYPE UNCNTRL 01/19/2010  . FOOT ULCER, RIGHT 01/19/2010  . MENOPAUSE-RELATED VASOMOTOR SYMPTOMS, HOT FLASHES 10/04/2009  . GOUT, UNSPECIFIED 06/07/2009  . LICHEN SIMPLEX CHRONICUS 11/30/2008  . OTITIS EXTERNA, CHRONIC 04/16/2008  . OBESITY, NOS 05/02/2006  . SCHIZOPHRENIA 05/02/2006  . HYPERTENSION, BENIGN SYSTEMIC 05/02/2006  . GASTROESOPHAGEAL REFLUX, NO ESOPHAGITIS 05/02/2006   Past Medical History:  Diagnosis  Date  . Arthritis   . Chronic back pain   . Depression   . Diabetes mellitus without complication ([place])   . Gout   . Hypertension   . Schizophrenic disorder ([place])     History reviewed. No pertinent family history.  Past Surgical History:  Procedure Laterality Date  . CESAREAN SECTION     x2  . NASAL ENDOSCOPY Left 09/13/2015   Procedure: NASAL ENDOSCOPY WITH NASAL INTABATION;  Surgeon: Jerrell Belfast, MD;  Location: [facility] OR;  Service: ENT;  Laterality: Left;   Social History   Occupational History  . Not on file  Tobacco Use  . Smoking status: Current Every Day Smoker    Packs/day: 0.33    Types: Cigarettes  . Smokeless tobacco: Never Used  Substance and Sexual Activity  . Alcohol use: No  . Drug use: No  . Sexual activity: Not on file

## 2017-01-21 ENCOUNTER — Other Ambulatory Visit (INDEPENDENT_AMBULATORY_CARE_PROVIDER_SITE_OTHER): Payer: Self-pay | Admitting: Family

## 2017-01-21 ENCOUNTER — Telehealth (INDEPENDENT_AMBULATORY_CARE_PROVIDER_SITE_OTHER): Payer: Self-pay | Admitting: Orthopedic Surgery

## 2017-01-21 MED ORDER — NAPROXEN 500 MG PO TABS: ORAL_TABLET | ORAL | 2 refills | Status: DC

## 2017-01-21 NOTE — Telephone Encounter (Signed)
Patient called needing Rx refilled (Naproxen and Nabumetone) Patient asked if the Rx can be sent to [facility]. The number to the pharmacy is [phone]. The number to contact patient is [phone]

## 2017-01-23 ENCOUNTER — Telehealth (INDEPENDENT_AMBULATORY_CARE_PROVIDER_SITE_OTHER): Payer: Self-pay | Admitting: Orthopedic Surgery

## 2017-01-23 MED ORDER — NAPROXEN 500 MG PO TABS: ORAL_TABLET | ORAL | 2 refills | Status: DC

## 2017-01-23 MED ORDER — NABUMETONE 750 MG PO TABS: 750.0000 mg | ORAL_TABLET | Freq: Two times a day (BID) | ORAL | 0 refills | Status: DC | PRN

## 2017-01-23 NOTE — Telephone Encounter (Signed)
Patient called asked if the Rx can be sent to [facility] on [address] instead of [facility]. The number to contact [facility] is [phone]. The number to contact patient is [phone]

## 2017-01-23 NOTE — Telephone Encounter (Signed)
rx sent to [facility].

## 2017-01-30 ENCOUNTER — Telehealth (INDEPENDENT_AMBULATORY_CARE_PROVIDER_SITE_OTHER): Payer: Self-pay | Admitting: Orthopedic Surgery

## 2017-01-30 NOTE — Telephone Encounter (Signed)
Patient called left voicemail message stating she would like to discontinue taking Naproxen. Patient advised she need pain medicine and muscle relaxer for stiffness in her joints. Patient said she is having pain in her leg, knee, ankle and heel. The number to contact patient is [phone]

## 2017-01-31 NOTE — Telephone Encounter (Signed)
Called and lm on vm to advise that per Junie Panning can not give rx for pain medication and that she needs to continue with the anti inflammatory medication as directed.

## 2017-02-01 NOTE — Telephone Encounter (Signed)
Jacqueline Reese left voicemail for patient yesterday.

## 2017-02-01 NOTE — Telephone Encounter (Signed)
Call patient for follow-up appointment.  We will not be able to use narcotics for chronic pain relief.  May need to set her up with a pain clinic.

## 2017-02-04 ENCOUNTER — Telehealth (INDEPENDENT_AMBULATORY_CARE_PROVIDER_SITE_OTHER): Payer: Self-pay | Admitting: Orthopedic Surgery

## 2017-02-04 NOTE — Telephone Encounter (Signed)
Patient called left voicemail message that her left hip and pelvic is hurting her. Patient she need a Rx for (Prednisone) The number to contact patient is [phone]

## 2017-02-05 ENCOUNTER — Other Ambulatory Visit (INDEPENDENT_AMBULATORY_CARE_PROVIDER_SITE_OTHER): Payer: Self-pay

## 2017-02-05 MED ORDER — PREDNISONE 10 MG PO TABS: ORAL_TABLET | ORAL | 0 refills | Status: DC

## 2017-02-05 NOTE — Telephone Encounter (Signed)
Ok per Dr. Sharol Given for pred 10mg  2 po qd #60 no refills this was entered into module and faxed to pharm. Called pt to advise and lm on vm to call with questions.

## 2017-02-08 ENCOUNTER — Telehealth (INDEPENDENT_AMBULATORY_CARE_PROVIDER_SITE_OTHER): Payer: Self-pay | Admitting: Orthopedic Surgery

## 2017-02-08 NOTE — Telephone Encounter (Signed)
Tried calling line was busy. Will try call again later today.

## 2017-02-08 NOTE — Telephone Encounter (Signed)
I called and spoke with patient. She is in a tough spot. She is doing physical therapy, she has pain from arthritis and worse with colder weather. She is taking gabapentin, and prednisone twice a day, she is praying she feels better, advised her to continue with current treatment of therapy and medications and see how she does.

## 2017-02-08 NOTE — Telephone Encounter (Signed)
Patient called saying that she is experiencing a lot of pain in her legs and would like to speak with you. CB # [phone]

## 2017-02-12 ENCOUNTER — Telehealth (INDEPENDENT_AMBULATORY_CARE_PROVIDER_SITE_OTHER): Payer: Self-pay | Admitting: Orthopedic Surgery

## 2017-02-12 NOTE — Telephone Encounter (Signed)
Patient left a message stating that she would like for you to send in the two prescriptions (I could not understand the name of the medications) to [facility].  [phone].  Thank you.

## 2017-02-13 NOTE — Telephone Encounter (Signed)
I called and spoke with patient to advise that we can not renew these medications while she is on prednisone.

## 2017-02-13 NOTE — Telephone Encounter (Signed)
No naprosyn or relafen with prednisone

## 2017-02-13 NOTE — Telephone Encounter (Signed)
Med refill  Naproxen (500 mg tablet)   Nabumetone (750 mg tablet)

## 2017-02-14 NOTE — Telephone Encounter (Signed)
Patient called again requesting the 2 medications (Naproxen 500 mg and Nabumetone 750 mg) I guess patient did not understand that she cant take these with prednisone as seen in previous message. She said her legs are still hurting really bad. Please advise [phone].

## 2017-02-15 NOTE — Telephone Encounter (Signed)
I called and left voicemail for patient again explaining the danger of taking too many NSAIDS being dangerous for her health and body. She cannot take prednisone, relafen, naproxen all together.

## 2017-02-21 ENCOUNTER — Other Ambulatory Visit (INDEPENDENT_AMBULATORY_CARE_PROVIDER_SITE_OTHER): Payer: Self-pay | Admitting: Orthopedic Surgery

## 2017-02-21 ENCOUNTER — Telehealth (INDEPENDENT_AMBULATORY_CARE_PROVIDER_SITE_OTHER): Payer: Self-pay | Admitting: Orthopedic Surgery

## 2017-02-21 MED ORDER — NABUMETONE 750 MG PO TABS: 750.0000 mg | ORAL_TABLET | Freq: Two times a day (BID) | ORAL | 0 refills | Status: AC | PRN

## 2017-02-21 NOTE — Telephone Encounter (Signed)
rx sent to [facility] on [address]

## 2017-02-21 NOTE — Telephone Encounter (Signed)
Patient called stated taken ALL Prednisone and she needs a refill on Nabumetone 750 mg

## 2017-02-28 ENCOUNTER — Telehealth (INDEPENDENT_AMBULATORY_CARE_PROVIDER_SITE_OTHER): Payer: Self-pay | Admitting: Orthopedic Surgery

## 2017-02-28 NOTE — Telephone Encounter (Signed)
Declined prednisone rx, patient just received rx for this recently. It is too soon for refill, advised her to come into the office for an appointment. Made appointment 03/11/16 at 8am.

## 2017-02-28 NOTE — Telephone Encounter (Signed)
Patient called stating that the Naproxen and Nabumetone are not working and she requests a refill on Prednisone because her legs are still hurting. CB # [phone]

## 2017-03-06 ENCOUNTER — Telehealth (INDEPENDENT_AMBULATORY_CARE_PROVIDER_SITE_OTHER): Payer: Self-pay | Admitting: Orthopedic Surgery

## 2017-03-06 NOTE — Telephone Encounter (Signed)
Patient called asking if she could get a refill on Neurontin and nabumetone. CB # [phone]

## 2017-03-06 NOTE — Telephone Encounter (Signed)
Declined, she has been communicated this numerous times, in the past several days.

## 2017-03-11 ENCOUNTER — Encounter (INDEPENDENT_AMBULATORY_CARE_PROVIDER_SITE_OTHER): Payer: Self-pay | Admitting: Orthopedic Surgery

## 2017-03-11 ENCOUNTER — Ambulatory Visit (INDEPENDENT_AMBULATORY_CARE_PROVIDER_SITE_OTHER): Payer: Medicare HMO | Admitting: Orthopedic Surgery

## 2017-03-11 DIAGNOSIS — M79605 Pain in left leg: Secondary | ICD-10-CM

## 2017-03-11 DIAGNOSIS — E1142 Type 2 diabetes mellitus with diabetic polyneuropathy: Secondary | ICD-10-CM

## 2017-03-11 DIAGNOSIS — M79604 Pain in right leg: Secondary | ICD-10-CM

## 2017-03-11 NOTE — Progress Notes (Signed)
Office Visit Note   Patient: Jacqueline Reese           Date of Birth: [birth year]           MRN: [id number] Visit Date: 03/11/2017              Requested by: Nolene Ebbs, MD [address] [place], [place] [address] PCP: Nolene Ebbs, MD  Chief Complaint  Patient presents with  . Right Leg - Pain  . Left Leg - Pain      HPI: Patient presents in follow-up for bilateral leg pain she states that her calves hurt bilaterally as well as the dorsum of both feet.  She states she has tried physical therapy with home health without relief for strengthening.  She states she does have a helps clean her home.  She states that her medical doctor is increased her gabapentin to 600 mg 3 times a day.  Patient states she is also taking Neurontin with her gabapentin.  Patient states she is still smoking she is on a diuretic.  Assessment & Plan: Visit Diagnoses:  1. Bilateral leg pain   2. Diabetic polyneuropathy associated with type 2 diabetes mellitus ([place])     Plan: Continue with mobilization as tolerated.  Recommended that she not take her Neurontin stated that this is the same as gabapentin.  She should only be taking 600 mg 3 times a day of the gabapentin as prescribed.  Recommended that she obtain some coconut water and drink about 3-4 ounces a day of coconut water to restore her electrolytes.  Follow-Up Instructions: Return in about 4 weeks (around 04/08/2017).   Ortho Exam  Patient is alert, oriented, no adenopathy, well-dressed, normal affect, normal respiratory effort. Examination patient ambulates in a wheelchair.  She has no pain with range of motion of the hips knees or ankles dorsiflexion of the foot with the leg extended is not painful the calf is soft nontender to palpation there is no swelling no signs of DVT no signs of infection no venous stasis swelling no ulcers.  She has a negative sciatic tension sign and no focal motor weakness.  Imaging: No results found. No  images are attached to the encounter.  Labs: Lab Results  Component Value Date   HGBA1C 8.4 01/19/2010   LABURIC 5.9 07/08/2009   LABURIC 9.9 (H) 06/06/2009   REPTSTATUS 06/04/2010 FINAL 06/02/2010   CULT  06/02/2010    GROUP B STREP(S.AGALACTIAE)ISOLATED Note: TESTING AGAINST S. AGALACTIAE NOT ROUTINELY PERFORMED DUE TO PREDICTABILITY OF AMP/PEN/VAN SUSCEPTIBILITY.    @LABSALLVALUES (HGBA1)@  There is no height or weight on file to calculate BMI.  Orders:  No orders of the defined types were placed in this encounter.  No orders of the defined types were placed in this encounter.    Procedures: No procedures performed  Clinical Data: No additional findings.  ROS:  All other systems negative, except as noted in the HPI. Review of Systems  Objective: Vital Signs: LMP 05/09/2010   Specialty Comments:  No specialty comments available.  PMFS History: Patient Active Problem List   Diagnosis Date Noted  . Angioedema 09/13/2015  . Anaphylaxis   . Hypertension, essential   . Acute hypoxemic respiratory failure ([place])   . Type 2 diabetes mellitus without complication, without long-term current use of insulin ([place])   . DIAB W/O MENTION COMP TYPE II/UNS TYPE UNCNTRL 01/19/2010  . FOOT ULCER, RIGHT 01/19/2010  . MENOPAUSE-RELATED VASOMOTOR SYMPTOMS, HOT FLASHES 10/04/2009  . GOUT, UNSPECIFIED 06/07/2009  .  LICHEN SIMPLEX CHRONICUS 11/30/2008  . OTITIS EXTERNA, CHRONIC 04/16/2008  . OBESITY, NOS 05/02/2006  . SCHIZOPHRENIA 05/02/2006  . HYPERTENSION, BENIGN SYSTEMIC 05/02/2006  . GASTROESOPHAGEAL REFLUX, NO ESOPHAGITIS 05/02/2006   Past Medical History:  Diagnosis Date  . Arthritis   . Chronic back pain   . Depression   . Diabetes mellitus without complication ([place])   . Gout   . Hypertension   . Schizophrenic disorder ([place])     History reviewed. No pertinent family history.  Past Surgical History:  Procedure Laterality Date  . CESAREAN SECTION     x2  .  NASAL ENDOSCOPY Left 09/13/2015   Procedure: NASAL ENDOSCOPY WITH NASAL INTABATION;  Surgeon: Jerrell Belfast, MD;  Location: [facility] OR;  Service: ENT;  Laterality: Left;   Social History   Occupational History  . Not on file  Tobacco Use  . Smoking status: Current Every Day Smoker    Packs/day: 0.33    Types: Cigarettes  . Smokeless tobacco: Never Used  Substance and Sexual Activity  . Alcohol use: No  . Drug use: No  . Sexual activity: Not on file

## 2017-03-13 ENCOUNTER — Telehealth (INDEPENDENT_AMBULATORY_CARE_PROVIDER_SITE_OTHER): Payer: Self-pay | Admitting: Orthopedic Surgery

## 2017-03-13 NOTE — Telephone Encounter (Signed)
Patient called saying that Dr. Sharol Given suggested her drink coconut water to help with pain, she states it's not working. If you could give her a call back at [phone]

## 2017-03-13 NOTE — Telephone Encounter (Signed)
I called pt and advised that she should continue with her Neurontin as directed and activity as she can tolerate. Pt advised to just cancel her next appt.

## 2017-03-18 ENCOUNTER — Telehealth (INDEPENDENT_AMBULATORY_CARE_PROVIDER_SITE_OTHER): Payer: Self-pay | Admitting: Orthopedic Surgery

## 2017-03-18 NOTE — Telephone Encounter (Signed)
Patient just called stating that she wants something for her joints, that will not interfere with her other medication

## 2017-03-18 NOTE — Telephone Encounter (Signed)
Patient calling requesting something for pain. She is already taking gabapentin from PCP, she states coconut water is not helping. Requesting something else. Please advise.

## 2017-03-18 NOTE — Telephone Encounter (Signed)
Patient called saying she has a lot of pain in her legs and was requesting a refill on her pain medication. CB # [phone]

## 2017-03-18 NOTE — Telephone Encounter (Signed)
Duplicate message, I have already asked Dr. Sharol Given. And advised patient of this over the phone, will need some time for Dr. Sharol Given to respond. I have spoken with patient less than 2 hours ago.

## 2017-03-19 NOTE — Telephone Encounter (Signed)
Set her up for PT and strengthening should help her joints

## 2017-03-20 NOTE — Telephone Encounter (Signed)
Called and lm on vm to advise for pt to call back and let me know if she is interested in physical therapy. We can set this up for her if thisis something that she wants to pursue.

## 2017-03-21 ENCOUNTER — Telehealth (INDEPENDENT_AMBULATORY_CARE_PROVIDER_SITE_OTHER): Payer: Self-pay | Admitting: Orthopedic Surgery

## 2017-03-21 NOTE — Telephone Encounter (Signed)
Rx refill Neurontin & Nabumetone 750mg  Pharmacy [facility]

## 2017-03-22 ENCOUNTER — Other Ambulatory Visit: Payer: Self-pay | Admitting: Internal Medicine

## 2017-03-22 DIAGNOSIS — Z1231 Encounter for screening mammogram for malignant neoplasm of breast: Secondary | ICD-10-CM

## 2017-03-26 ENCOUNTER — Telehealth (INDEPENDENT_AMBULATORY_CARE_PROVIDER_SITE_OTHER): Payer: Self-pay | Admitting: Orthopedic Surgery

## 2017-03-26 NOTE — Telephone Encounter (Signed)
Pt called requesting pain med to help her with pain in her right hip. Pt stated that she didn't know what med she needed just needs pain relief.

## 2017-03-26 NOTE — Telephone Encounter (Signed)
Patient aware that Dr. Sharol Given is unable to prescribe her any more medications. She is already taking gabapentin from pcp, and is taking multiple NSAIDS, narcotics are not appropriate. Patient has been set up for home health therapy and been counseled numerous times why narcotics cannot be prescribed.

## 2017-04-07 ENCOUNTER — Ambulatory Visit (HOSPITAL_COMMUNITY)
Admission: EM | Admit: 2017-04-07 | Discharge: 2017-04-07 | Disposition: A | Discharge status: Home / Self Care | Payer: Medicare HMO | Attending: Urgent Care | Admitting: Urgent Care

## 2017-04-07 ENCOUNTER — Other Ambulatory Visit: Payer: Self-pay

## 2017-04-07 DIAGNOSIS — M25552 Pain in left hip: Secondary | ICD-10-CM

## 2017-04-07 DIAGNOSIS — M25562 Pain in left knee: Secondary | ICD-10-CM | POA: Diagnosis not present

## 2017-04-07 DIAGNOSIS — E118 Type 2 diabetes mellitus with unspecified complications: Secondary | ICD-10-CM

## 2017-04-07 DIAGNOSIS — Z8739 Personal history of other diseases of the musculoskeletal system and connective tissue: Secondary | ICD-10-CM

## 2017-04-07 DIAGNOSIS — M25561 Pain in right knee: Secondary | ICD-10-CM | POA: Diagnosis not present

## 2017-04-07 DIAGNOSIS — M25551 Pain in right hip: Secondary | ICD-10-CM

## 2017-04-07 DIAGNOSIS — M79662 Pain in left lower leg: Secondary | ICD-10-CM

## 2017-04-07 DIAGNOSIS — M79661 Pain in right lower leg: Secondary | ICD-10-CM

## 2017-04-07 LAB — POCT I-STAT, CHEM 8
BUN: 6 mg/dL (ref 6–20)
CALCIUM ION: 1.18 mmol/L (ref 1.15–1.40)
Chloride: 106 mmol/L (ref 101–111)
Creatinine, Ser: 0.4 mg/dL — ABNORMAL LOW (ref 0.44–1.00)
GLUCOSE: 74 mg/dL (ref 65–99)
HCT: 41 % (ref 36.0–46.0)
HEMOGLOBIN: 13.9 g/dL (ref 12.0–15.0)
POTASSIUM: 3.9 mmol/L (ref 3.5–5.1)
Sodium: 142 mmol/L (ref 135–145)
TCO2: 24 mmol/L (ref 22–32)

## 2017-04-07 MED ORDER — PREDNISONE 20 MG PO TABS: ORAL_TABLET | ORAL | 0 refills | Status: DC

## 2017-04-07 NOTE — ED Triage Notes (Signed)
Per pt pain is not Gout and per pt she ran out of her Prednisone, per pt her legs, ankle, feet and hip hurts,

## 2017-04-07 NOTE — ED Provider Notes (Signed)
MRN: [id number] DOB: 10/23/1955  Subjective:   Jacqueline Reese is a 62 y.o. female presenting for 1 week history of bilateral calf pain, foot pain, ankle pain, hip pain. Denies any recent falls, trauma, redness, swelling. States that she has a history of arthritis. Has a history of gout as well. Dr. Darius Bump is her PCP, Dr. Laney Pastor is her orthopedist and has told patient she has rheumatoid arthritis. Dr. Darius Bump saw patient 2 weeks ago and started patient on prednisone course for 7 days, had significant improvement in her joint pains. However, she has diabetes, states that her blood sugar readings are 80's fasting. Denies history of blood clots. Denies any recent surgeries, long distance travel. Smokes 4 cigarettes per day.  No current facility-administered medications for this encounter.   Current Outpatient Medications:  .  Acetaminophen 500 MG coapsule, , Disp: , Rfl:  .  albuterol (PROVENTIL HFA;VENTOLIN HFA) 108 (90 BASE) MCG/ACT inhaler, Inhale 2 puffs into the lungs every 4 (four) hours as needed for wheezing or shortness of breath., Disp: 1 Inhaler, Rfl: 0 .  allopurinol (ZYLOPRIM) 100 MG tablet, Take 100 mg by mouth 2 (two) times daily. , Disp: , Rfl:  .  amLODipine (NORVASC) 5 MG tablet, Take 1 tablet (5 mg total) by mouth daily., Disp: 30 tablet, Rfl: 0 .  aspirin EC 325 MG tablet, Take 325 mg by mouth daily., Disp: , Rfl:  .  buPROPion (WELLBUTRIN XL) 150 MG 24 hr tablet, Take 150 mg by mouth daily., Disp: , Rfl:  .  clonazePAM (KLONOPIN) 1 MG tablet, Take 1 mg by mouth 2 (two) times daily. , Disp: , Rfl:  .  cyclobenzaprine (FLEXERIL) 10 MG tablet, Take 10 mg by mouth 3 (three) times daily as needed for muscle spasms., Disp: , Rfl:  .  Diclofenac Sodium 3 % GEL, Place 1 application onto the skin 2 (two) times daily. To affected area., Disp: 50 g, Rfl: 0 .  diphenhydrAMINE (BENADRYL) 25 mg capsule, Take 1 capsule (25 mg total) by mouth 2 (two) times daily., Disp: 30 capsule, Rfl:  0 .  escitalopram (LEXAPRO) 20 MG tablet, Take 20 mg by mouth daily. , Disp: , Rfl:  .  famotidine (PEPCID) 40 MG tablet, Take 1 tablet (40 mg total) by mouth daily., Disp: 30 tablet, Rfl: 0 .  gabapentin (NEURONTIN) 300 MG capsule, Take 600 mg 3 (three) times daily by mouth., Disp: , Rfl:  .  glipiZIDE (GLUCOTROL XL) 2.5 MG 24 hr tablet, Take 2.5 mg by mouth daily., Disp: , Rfl:  .  meloxicam (MOBIC) 7.5 MG tablet, Take 1 tablet (7.5 mg total) by mouth daily., Disp: 30 tablet, Rfl: 0 .  metFORMIN (GLUCOPHAGE) 500 MG tablet, Take 500 mg by mouth 2 (two) times daily with a meal., Disp: , Rfl:  .  naproxen (NAPROSYN) 500 MG tablet, TAKE 1 TABLET TWO TIMES DAILY BY MOUTH AS NEEDED FOR MILD PAIN OR MODERATE PAIN, Disp: 60 tablet, Rfl: 2 .  predniSONE (DELTASONE) 10 MG tablet, Take 2 tabs by mouth each morning with breakfast., Disp: 60 tablet, Rfl: 0 .  spironolactone (ALDACTONE) 50 MG tablet, Take 50 mg by mouth daily., Disp: , Rfl:  .  VOLTAREN 1 % GEL, , Disp: , Rfl: 0 .  ziprasidone (GEODON) 80 MG capsule, Take 240 mg by mouth every evening., Disp: , Rfl: 0   Emylee is allergic to lisinopril; chantix [varenicline]; doxycycline; ibuprofen; lamisil [terbinafine]; prednisone; cephalexin; and sulfa antibiotics.  Kelita  has a past  medical history of Arthritis, Chronic back pain, Depression, Diabetes mellitus without complication ([place]), Gout, Hypertension, and Schizophrenic disorder ([place]). Also  has a past surgical history that includes Cesarean section and Nasal endoscopy (Left, 09/13/2015).  Objective:   Vitals: BP 127/74 (BP Location: Left Arm)   Pulse (!) 101   Temp 98.4 F (36.9 C) (Oral)   LMP 05/09/2010   SpO2 97%   Physical Exam  Constitutional: She is oriented to person, place, and time. She appears well-developed and well-nourished.  Cardiovascular: Normal rate.  Pulmonary/Chest: Effort normal.  Musculoskeletal:       Right knee: She exhibits decreased range of motion. She exhibits  no swelling, no effusion, no ecchymosis, no deformity, no laceration, no erythema, normal alignment and normal patellar mobility. No tenderness found.       Left knee: She exhibits decreased range of motion. She exhibits no swelling, no effusion, no ecchymosis, no deformity, no laceration, no erythema, normal alignment and normal patellar mobility. No tenderness found.       Right ankle: She exhibits normal range of motion, no swelling, no ecchymosis, no deformity and no laceration. No tenderness.       Left ankle: She exhibits normal range of motion, no swelling, no ecchymosis, no deformity and no laceration. No tenderness.       Right lower leg: She exhibits no tenderness (negative Homann sign), no bony tenderness, no swelling, no edema, no deformity and no laceration.       Left lower leg: She exhibits no tenderness (negative homann sign), no bony tenderness, no swelling, no edema, no deformity and no laceration.  Knee, ankles, feet without erythema.  Neurological: She is alert and oriented to person, place, and time.    Results for orders placed or performed during the hospital encounter of 04/07/17 (from the past 24 hour(s))  I-STAT, chem 8     Status: Abnormal   Collection Time: 04/07/17 12:47 PM  Result Value Ref Range   Sodium 142 135 - 145 mmol/L   Potassium 3.9 3.5 - 5.1 mmol/L   Chloride 106 101 - 111 mmol/L   BUN 6 6 - 20 mg/dL   Creatinine, Ser 0.40 (L) 0.44 - 1.00 mg/dL   Glucose, Bld 74 65 - 99 mg/dL   Calcium, Ion 1.18 1.15 - 1.40 mmol/L   TCO2 24 22 - 32 mmol/L   Hemoglobin 13.9 12.0 - 15.0 g/dL   HCT 41.0 36.0 - 46.0 %    Assessment and Plan :   Arthralgia of both lower legs  Bilateral calf pain  Bilateral hip pain  History of arthritis  History of gout  Controlled type 2 diabetes mellitus with complication, without long-term current use of insulin (HCC)  Will start patient on short steroid course. She has failed meloxicam, takes APAP regularly. She is taking  allopurinol and physical exam findings are not consistent with gout. Her blood sugar and kidney function are wnl. Counseled patient on potential for adverse effects with medications prescribed today, patient verbalized understanding. She is to set up follow up with both her PCP and orthopedist for ongoing management of her arthritis and/or rheumatoid arthritis.   Jaynee Eagles, PA-C 04/07/17 1314

## 2017-04-07 NOTE — Discharge Instructions (Signed)
You may take 500mg  Tylenol every 6 hours for arthritis pain and inflammation.

## 2017-04-08 ENCOUNTER — Telehealth (INDEPENDENT_AMBULATORY_CARE_PROVIDER_SITE_OTHER): Payer: Self-pay | Admitting: Orthopedic Surgery

## 2017-04-08 NOTE — Telephone Encounter (Signed)
Pt will come in for office visit 04/25/17

## 2017-04-08 NOTE — Telephone Encounter (Signed)
Patient called this morning stating that she went the Urgent care for poor circulation in her legs.  She wants to know if Dr. Sharol Given will call her in something for the poor circulation.  Patient uses [facility] on [address].  TU#[phone].  Thank you.

## 2017-04-15 ENCOUNTER — Ambulatory Visit (HOSPITAL_COMMUNITY)
Admission: RE | Admit: 2017-04-15 | Discharge: 2017-04-15 | Disposition: A | Discharge status: Home / Self Care | Payer: Medicare HMO | Source: Ambulatory Visit | Attending: Surgery | Admitting: Surgery

## 2017-04-15 ENCOUNTER — Other Ambulatory Visit (HOSPITAL_COMMUNITY): Payer: Self-pay | Admitting: Internal Medicine

## 2017-04-15 DIAGNOSIS — I739 Peripheral vascular disease, unspecified: Secondary | ICD-10-CM | POA: Insufficient documentation

## 2017-04-25 ENCOUNTER — Ambulatory Visit (INDEPENDENT_AMBULATORY_CARE_PROVIDER_SITE_OTHER): Payer: Medicare HMO | Admitting: Orthopedic Surgery

## 2017-04-29 ENCOUNTER — Ambulatory Visit (INDEPENDENT_AMBULATORY_CARE_PROVIDER_SITE_OTHER): Payer: Medicare HMO | Admitting: Orthopedic Surgery

## 2017-05-02 ENCOUNTER — Telehealth (INDEPENDENT_AMBULATORY_CARE_PROVIDER_SITE_OTHER): Payer: Self-pay | Admitting: Orthopedic Surgery

## 2017-05-02 NOTE — Telephone Encounter (Signed)
Pt wanting refill on gabapentin, mobic, nabumetone please advise.

## 2017-05-02 NOTE — Telephone Encounter (Signed)
Ok, only fill one NSAID

## 2017-05-02 NOTE — Telephone Encounter (Signed)
Rx refill Gabapentin 300mg 

## 2017-05-02 NOTE — Telephone Encounter (Signed)
Patient called asking if her Gabapentin and Meloxicam could be called into her [facility] insurance since she cannot afford to pay for her medication. She also states both her knees are popping and she would like to speak with you about her pain. CB # [phone]

## 2017-05-02 NOTE — Telephone Encounter (Signed)
[employer] called asking for a refill request on the nabumetone. CB # [phone]

## 2017-05-02 NOTE — Telephone Encounter (Signed)
Message sent in error

## 2017-05-03 ENCOUNTER — Other Ambulatory Visit (INDEPENDENT_AMBULATORY_CARE_PROVIDER_SITE_OTHER): Payer: Self-pay

## 2017-05-03 MED ORDER — NABUMETONE 750 MG PO TABS: 750.0000 mg | ORAL_TABLET | Freq: Two times a day (BID) | ORAL | 3 refills | Status: DC | PRN

## 2017-05-03 MED ORDER — GABAPENTIN 300 MG PO CAPS: 600.0000 mg | ORAL_CAPSULE | Freq: Three times a day (TID) | ORAL | 3 refills | Status: DC

## 2017-05-03 NOTE — Telephone Encounter (Signed)
Duplicate message pending pt return call will hold original in box until then.

## 2017-05-03 NOTE — Telephone Encounter (Signed)
Put in rx

## 2017-05-03 NOTE — Telephone Encounter (Signed)
I called and lm on vm to advise of message below. Pt will call to advise if she wants mobic or nabumetone

## 2017-05-03 NOTE — Telephone Encounter (Signed)
Patient called back stating that she wants the nabumetone refilled and that it should go through [facility].  Thank you.

## 2017-05-14 ENCOUNTER — Encounter (INDEPENDENT_AMBULATORY_CARE_PROVIDER_SITE_OTHER): Payer: Self-pay | Admitting: Orthopedic Surgery

## 2017-05-14 ENCOUNTER — Telehealth (INDEPENDENT_AMBULATORY_CARE_PROVIDER_SITE_OTHER): Payer: Self-pay | Admitting: Orthopedic Surgery

## 2017-05-14 ENCOUNTER — Ambulatory Visit (INDEPENDENT_AMBULATORY_CARE_PROVIDER_SITE_OTHER): Payer: Medicare HMO | Admitting: Orthopedic Surgery

## 2017-05-14 DIAGNOSIS — M79604 Pain in right leg: Secondary | ICD-10-CM | POA: Diagnosis not present

## 2017-05-14 DIAGNOSIS — M79605 Pain in left leg: Secondary | ICD-10-CM

## 2017-05-14 MED ORDER — PREDNISONE 10 MG PO TABS: 20.0000 mg | ORAL_TABLET | Freq: Every day | ORAL | 0 refills | Status: DC

## 2017-05-14 NOTE — Telephone Encounter (Signed)
Patient states gabapentin is not helping, her legs are hurting and about to give out. Please advise if there is something else she can take. # [phone]

## 2017-05-14 NOTE — Telephone Encounter (Signed)
I called pt and she wants to discuss medication and I advised that appt with Dr. Sharol Given to discuss treatment options. appt made for Thursday 05/16/17 at 9:45

## 2017-05-14 NOTE — Progress Notes (Signed)
Office Visit Note   Patient: Jacqueline Reese           Date of Birth: [birth year]           MRN: [id number] Visit Date: 05/14/2017              Requested by: Nolene Ebbs, MD [address] [place], [place] [address] PCP: Nolene Ebbs, MD  Chief Complaint  Patient presents with  . Follow-up    bilateral lower extremity pain      HPI: Patient is a 62 year old woman who presents follow-up for bilateral leg pain.  Patient states that she did not try using the coconut water she states she still smokes.  She has had MRI scans of her legs which shows no mass or lesions.  She has had ultrasounds which were negative for DVT.  Patient has no claudication symptoms.  Patient states she has pain at rest pain with walking pain with activities of daily living.  Assessment & Plan: Visit Diagnoses:  1. Bilateral leg pain     Plan: Recommended using several ounces of coconut water in her only shoes every morning to help restore the electrolytes.  Discussed the importance of exercising to improve the strength in her legs.  Discussed that she does not have arterial insufficiency she has mild venous insufficiency she has no signs or symptoms of DVT or mass.  Again discussed the importance of smoking cessation.  Patient states the prednisone has worked well in the past a refill prescription was provided.  Follow-Up Instructions: Return if symptoms worsen or fail to improve.   Ortho Exam  Patient is alert, oriented, no adenopathy, well-dressed, normal affect, normal respiratory effort. Examination patient has palpable pulses in both feet there is no arterial insufficiency.  She has some mild venous stasis swelling.  She is wearing a pair of loose compression stockings.  She has no pain with range of motion of the hip knee or ankle.  She has negative straight leg raise bilaterally she has no focal motor weakness in either lower extremity no evidence of radicular symptoms.  There is no pain to  palpation in either calf.  There is no redness no cellulitis no signs of infection no signs of gout.  There is no pain to palpation along the tibia.  No results found. No images are attached to the encounter.  Labs: Lab Results  Component Value Date   HGBA1C 8.4 01/19/2010   LABURIC 5.9 07/08/2009   LABURIC 9.9 (H) 06/06/2009   REPTSTATUS 06/04/2010 FINAL 06/02/2010   CULT  06/02/2010    GROUP B STREP(S.AGALACTIAE)ISOLATED Note: TESTING AGAINST S. AGALACTIAE NOT ROUTINELY PERFORMED DUE TO PREDICTABILITY OF AMP/PEN/VAN SUSCEPTIBILITY.    @LABSALLVALUES (HGBA1)@  There is no height or weight on file to calculate BMI.  Orders:  No orders of the defined types were placed in this encounter.  Meds ordered this encounter  Medications  . predniSONE (DELTASONE) 10 MG tablet    Sig: Take 2 tablets (20 mg total) by mouth daily with breakfast.    Dispense:  60 tablet    Refill:  0     Procedures: No procedures performed  Clinical Data: No additional findings.  ROS:  All other systems negative, except as noted in the HPI. Review of Systems  Objective: Vital Signs: LMP 05/09/2010   Specialty Comments:  No specialty comments available.  PMFS History: Patient Active Problem List   Diagnosis Date Noted  . Angioedema 09/13/2015  . Anaphylaxis   .  Hypertension, essential   . Acute hypoxemic respiratory failure ([place])   . Type 2 diabetes mellitus without complication, without long-term current use of insulin ([place])   . DIAB W/O MENTION COMP TYPE II/UNS TYPE UNCNTRL 01/19/2010  . FOOT ULCER, RIGHT 01/19/2010  . MENOPAUSE-RELATED VASOMOTOR SYMPTOMS, HOT FLASHES 10/04/2009  . GOUT, UNSPECIFIED 06/07/2009  . LICHEN SIMPLEX CHRONICUS 11/30/2008  . OTITIS EXTERNA, CHRONIC 04/16/2008  . OBESITY, NOS 05/02/2006  . SCHIZOPHRENIA 05/02/2006  . HYPERTENSION, BENIGN SYSTEMIC 05/02/2006  . GASTROESOPHAGEAL REFLUX, NO ESOPHAGITIS 05/02/2006   Past Medical History:  Diagnosis Date  .  Arthritis   . Chronic back pain   . Depression   . Diabetes mellitus without complication ([place])   . Gout   . Hypertension   . Schizophrenic disorder ([place])     History reviewed. No pertinent family history.  Past Surgical History:  Procedure Laterality Date  . CESAREAN SECTION     x2  . NASAL ENDOSCOPY Left 09/13/2015   Procedure: NASAL ENDOSCOPY WITH NASAL INTABATION;  Surgeon: Jerrell Belfast, MD;  Location: [facility] OR;  Service: ENT;  Laterality: Left;   Social History   Occupational History  . Not on file  Tobacco Use  . Smoking status: Current Every Day Smoker    Packs/day: 0.33    Types: Cigarettes  . Smokeless tobacco: Never Used  Substance and Sexual Activity  . Alcohol use: No  . Drug use: No  . Sexual activity: Not on file

## 2017-05-16 ENCOUNTER — Ambulatory Visit (INDEPENDENT_AMBULATORY_CARE_PROVIDER_SITE_OTHER): Payer: Medicare HMO | Admitting: Orthopedic Surgery

## 2017-05-17 ENCOUNTER — Telehealth (INDEPENDENT_AMBULATORY_CARE_PROVIDER_SITE_OTHER): Payer: Self-pay | Admitting: Orthopedic Surgery

## 2017-05-17 NOTE — Telephone Encounter (Signed)
Pt called requesting a pain med. Pt stated she worked out and now she is in a lot of pain.

## 2017-05-17 NOTE — Telephone Encounter (Signed)
Spoke with patient: she wanted to know if she could take more than 2 Prednisone qd.  She has been exercising and said her legs hurt afterward. Advised her to continue taking just 2 of the Prednisone only, and be sure to take it with food. Also advised her to elevate her legs when she is sitting or lying down.  She then asked if she could take the Nabumetone. I reiterated to her to not take Prednisone and Nabumetone together.  This Rx of Prednisone should last for one month (30 days), and she can take the Nabumetone again when that runs out if she so wishes.

## 2017-05-20 NOTE — Telephone Encounter (Signed)
This was a duplicate message.

## 2017-05-20 NOTE — Telephone Encounter (Signed)
I called and advised to use the pred rx that Dr. Sharol Given had given, continue with the leg exercises and to use the 4 oz of coconut water and pt states that she will do this and check in with the office at the end of the month.

## 2017-05-20 NOTE — Telephone Encounter (Signed)
Pt stated she is working out and doing. Pt stated the prednisone is currently not working pt is still in a a lot of pain.

## 2017-05-23 ENCOUNTER — Telehealth (INDEPENDENT_AMBULATORY_CARE_PROVIDER_SITE_OTHER): Payer: Self-pay | Admitting: Orthopedic Surgery

## 2017-05-23 NOTE — Telephone Encounter (Signed)
Noted  

## 2017-05-23 NOTE — Telephone Encounter (Signed)
Patient wants to let Dr. Sharol Given she has been exercising and drinking her coconut water for the past 4 days but it is making her sore and tired? She said she didn't need a call back, just to pass the info.

## 2017-05-27 ENCOUNTER — Telehealth (INDEPENDENT_AMBULATORY_CARE_PROVIDER_SITE_OTHER): Payer: Self-pay | Admitting: Orthopedic Surgery

## 2017-05-27 NOTE — Telephone Encounter (Signed)
Patient called stating that she has been exercising 5 times a week and that the prednisone is not working. She would like something else to help with the pain if possible. CB # [phone]

## 2017-05-27 NOTE — Telephone Encounter (Signed)
I called and sw pt advised she needs to come in and discuss medication with Dr. Sharol Given.

## 2017-05-29 ENCOUNTER — Telehealth (INDEPENDENT_AMBULATORY_CARE_PROVIDER_SITE_OTHER): Payer: Self-pay

## 2017-05-29 NOTE — Telephone Encounter (Signed)
Patient left voicemail and she states she is in a lot of pain. She needs to be seen sooner than her appt date 06/10/17. States it hurts to move.     CB [phone]

## 2017-05-29 NOTE — Telephone Encounter (Signed)
Called and lm on vm to advise that we can work the pt in on Monday 03/05/17 but that is the first available appt that we have. To call and let me know if she would like to be worked in for Monday.

## 2017-06-03 ENCOUNTER — Encounter (INDEPENDENT_AMBULATORY_CARE_PROVIDER_SITE_OTHER): Payer: Self-pay | Admitting: Orthopedic Surgery

## 2017-06-03 ENCOUNTER — Ambulatory Visit (INDEPENDENT_AMBULATORY_CARE_PROVIDER_SITE_OTHER): Payer: Medicare HMO | Admitting: Orthopedic Surgery

## 2017-06-03 ENCOUNTER — Telehealth (INDEPENDENT_AMBULATORY_CARE_PROVIDER_SITE_OTHER): Payer: Self-pay | Admitting: Orthopedic Surgery

## 2017-06-03 VITALS — Ht 62.0 in | Wt 143.0 lb

## 2017-06-03 DIAGNOSIS — M79605 Pain in left leg: Secondary | ICD-10-CM

## 2017-06-03 DIAGNOSIS — M79604 Pain in right leg: Secondary | ICD-10-CM | POA: Diagnosis not present

## 2017-06-03 MED ORDER — NABUMETONE 750 MG PO TABS: 750.0000 mg | ORAL_TABLET | Freq: Two times a day (BID) | ORAL | 3 refills | Status: AC | PRN

## 2017-06-03 MED ORDER — GABAPENTIN 300 MG PO CAPS: 600.0000 mg | ORAL_CAPSULE | Freq: Three times a day (TID) | ORAL | 3 refills | Status: DC

## 2017-06-03 NOTE — Telephone Encounter (Signed)
This was done today while pt in office.

## 2017-06-03 NOTE — Telephone Encounter (Signed)
Med refill  Gabapentin [employer]

## 2017-06-03 NOTE — Progress Notes (Signed)
Office Visit Note   Patient: Jacqueline Reese           Date of Birth: 10/19/1955           MRN: [id number] Visit Date: 06/03/2017              Requested by: Nolene Ebbs, MD [address] [place], [place] [address] PCP: Nolene Ebbs, MD  Chief Complaint  Patient presents with  . Right Leg - Pain  . Left Leg - Pain      HPI: Patient is a 62 year old woman who presents complaining of persistent bilateral leg pain.  Patient states she is still smoking the importance of smoking cessation was discussed.  Patient states she does exercises in a chair.  Patient states she has started using coconut water and this does help her leg pain.  Patient states the gabapentin helps she is completed a course of prednisone patient request a pain medicine that is multiple times a day.  Assessment & Plan: Visit Diagnoses:  1. Bilateral leg pain     Plan: Will refill her prescription for gabapentin will call in a prescription for Relafen 750 mg twice a day.  Recommend continue with her exercise program.  Follow-Up Instructions: No follow-ups on file.   Ortho Exam  Patient is alert, oriented, no adenopathy, well-dressed, normal affect, normal respiratory effort. Examination patient is ambulating in a wheelchair.  She has a negative straight leg raise bilaterally she has no pain with range of motion of the hips knees or ankles.  She has no focal motor weakness in either lower extremity.  The calf is soft nontender to palpation no evidence of DVT.  Imaging: No results found. No images are attached to the encounter.  Labs: Lab Results  Component Value Date   HGBA1C 8.4 01/19/2010   LABURIC 5.9 07/08/2009   LABURIC 9.9 (H) 06/06/2009   REPTSTATUS 06/04/2010 FINAL 06/02/2010   CULT  06/02/2010    GROUP B STREP(S.AGALACTIAE)ISOLATED Note: TESTING AGAINST S. AGALACTIAE NOT ROUTINELY PERFORMED DUE TO PREDICTABILITY OF AMP/PEN/VAN SUSCEPTIBILITY.    @LABSALLVALUES (HGBA1)@  Body mass  index is 26.16 kg/m.  Orders:  No orders of the defined types were placed in this encounter.  Meds ordered this encounter  Medications  . nabumetone (RELAFEN) 750 MG tablet    Sig: Take 1 tablet (750 mg total) by mouth 2 (two) times daily as needed for mild pain or moderate pain. with food    Dispense:  60 tablet    Refill:  3  . gabapentin (NEURONTIN) 300 MG capsule    Sig: Take 2 capsules (600 mg total) by mouth 3 (three) times daily.    Dispense:  180 capsule    Refill:  3     Procedures: No procedures performed  Clinical Data: No additional findings.  ROS:  All other systems negative, except as noted in the HPI. Review of Systems  Objective: Vital Signs: Ht 5\' 2"  (1.575 m)   Wt 143 lb (64.9 kg)   LMP 05/09/2010   BMI 26.16 kg/m   Specialty Comments:  No specialty comments available.  PMFS History: Patient Active Problem List   Diagnosis Date Noted  . Angioedema 09/13/2015  . Anaphylaxis   . Hypertension, essential   . Acute hypoxemic respiratory failure ([place])   . Type 2 diabetes mellitus without complication, without long-term current use of insulin ([place])   . DIAB W/O MENTION COMP TYPE II/UNS TYPE UNCNTRL 01/19/2010  . FOOT ULCER, RIGHT 01/19/2010  .  MENOPAUSE-RELATED VASOMOTOR SYMPTOMS, HOT FLASHES 10/04/2009  . GOUT, UNSPECIFIED 06/07/2009  . LICHEN SIMPLEX CHRONICUS 11/30/2008  . OTITIS EXTERNA, CHRONIC 04/16/2008  . OBESITY, NOS 05/02/2006  . SCHIZOPHRENIA 05/02/2006  . HYPERTENSION, BENIGN SYSTEMIC 05/02/2006  . GASTROESOPHAGEAL REFLUX, NO ESOPHAGITIS 05/02/2006   Past Medical History:  Diagnosis Date  . Arthritis   . Chronic back pain   . Depression   . Diabetes mellitus without complication ([place])   . Gout   . Hypertension   . Schizophrenic disorder ([place])     History reviewed. No pertinent family history.  Past Surgical History:  Procedure Laterality Date  . CESAREAN SECTION     x2  . NASAL ENDOSCOPY Left 09/13/2015   Procedure: NASAL  ENDOSCOPY WITH NASAL INTABATION;  Surgeon: Jerrell Belfast, MD;  Location: [facility];  Service: ENT;  Laterality: Left;   Social History   Occupational History  . Not on file  Tobacco Use  . Smoking status: Current Every Day Smoker    Packs/day: 0.33    Types: Cigarettes  . Smokeless tobacco: Never Used  Substance and Sexual Activity  . Alcohol use: No  . Drug use: No  . Sexual activity: Not on file

## 2017-06-03 NOTE — Telephone Encounter (Signed)
Pt called stating she went to her pharmacy to pick up her medication and they advised her that the meds were to early to fill and can not get refilled until the 11 April, would like a call back to explain this to her.

## 2017-06-04 NOTE — Telephone Encounter (Signed)
I called pt and she wanted to know what rx had been written for her at her last appt and the mg strength. Advised Neurontin and relafen. Pt did not have any other questions she states that she will continue with her exercise program give the rx a try and use her coconut water everyday and call if she needs an appt.

## 2017-06-06 ENCOUNTER — Ambulatory Visit
Admission: RE | Admit: 2017-06-06 | Discharge: 2017-06-06 | Disposition: A | Discharge status: Home / Self Care | Payer: Medicare HMO | Source: Ambulatory Visit | Attending: Internal Medicine | Admitting: Internal Medicine

## 2017-06-06 DIAGNOSIS — Z1231 Encounter for screening mammogram for malignant neoplasm of breast: Secondary | ICD-10-CM

## 2017-06-07 ENCOUNTER — Telehealth (INDEPENDENT_AMBULATORY_CARE_PROVIDER_SITE_OTHER): Payer: Self-pay | Admitting: Orthopedic Surgery

## 2017-06-07 NOTE — Telephone Encounter (Signed)
Patient called requesting that the Nabumetone (Relafen) be sent to [facility].  Patient stated that she does not like [facility] medicine.  OL#[phone].  Thank you

## 2017-06-10 ENCOUNTER — Ambulatory Visit (INDEPENDENT_AMBULATORY_CARE_PROVIDER_SITE_OTHER): Payer: Medicare HMO | Admitting: Orthopedic Surgery

## 2017-06-10 NOTE — Telephone Encounter (Signed)
This was sent to [facility] on 05/03/17 and then another rx sent to [facility] on 06/03/17. I will call pt to advise that this had already been faxed last month.

## 2017-06-11 ENCOUNTER — Ambulatory Visit (HOSPITAL_COMMUNITY)
Admission: EM | Admit: 2017-06-11 | Discharge: 2017-06-11 | Disposition: A | Discharge status: Home / Self Care | Payer: Medicare HMO | Attending: Family Medicine | Admitting: Family Medicine

## 2017-06-11 ENCOUNTER — Encounter (HOSPITAL_COMMUNITY): Payer: Self-pay | Admitting: Emergency Medicine

## 2017-06-11 DIAGNOSIS — M79605 Pain in left leg: Secondary | ICD-10-CM | POA: Diagnosis not present

## 2017-06-11 DIAGNOSIS — M79604 Pain in right leg: Secondary | ICD-10-CM | POA: Diagnosis not present

## 2017-06-11 MED ORDER — CYCLOBENZAPRINE HCL 10 MG PO TABS: 10.0000 mg | ORAL_TABLET | Freq: Two times a day (BID) | ORAL | 0 refills | Status: AC | PRN

## 2017-06-11 NOTE — ED Provider Notes (Signed)
[place]    CSN: [id number] Arrival date & time: 06/11/17  1000     History   Chief Complaint Chief Complaint  Patient presents with  . Leg Pain    HPI Jacqueline Reese is a 62 y.o. female.   With history of type 2 diabetes, hypertension, chronic back pain, gout, osteoarthritis and schizophrenic disorder, presents today for bilateral leg pain for 2 weeks.  Patient saw her orthopedic specialist on April 1 for the same complaintand was given to gabapentin refill and Relafen 750 mg BID. She is still waiting for her Relafen to arrive in the mail as the provider send in to [facility]. Patient endorses the pain are from bilateral hip and radiates down to legs. Pain is described as muscle spasm and tightness and is intermittent. Denies any new injury. Denies leg swelling or redness.      Past Medical History:  Diagnosis Date  . Arthritis   . Chronic back pain   . Depression   . Diabetes mellitus without complication ([place])   . Gout   . Hypertension   . Schizophrenic disorder [facility])     Patient Active Problem List   Diagnosis Date Noted  . Angioedema 09/13/2015  . Anaphylaxis   . Hypertension, essential   . Acute hypoxemic respiratory failure ([place])   . Type 2 diabetes mellitus without complication, without long-term current use of insulin ([place])   . DIAB W/O MENTION COMP TYPE II/UNS TYPE UNCNTRL 01/19/2010  . FOOT ULCER, RIGHT 01/19/2010  . MENOPAUSE-RELATED VASOMOTOR SYMPTOMS, HOT FLASHES 10/04/2009  . GOUT, UNSPECIFIED 06/07/2009  . LICHEN SIMPLEX CHRONICUS 11/30/2008  . OTITIS EXTERNA, CHRONIC 04/16/2008  . OBESITY, NOS 05/02/2006  . SCHIZOPHRENIA 05/02/2006  . HYPERTENSION, BENIGN SYSTEMIC 05/02/2006  . GASTROESOPHAGEAL REFLUX, NO ESOPHAGITIS 05/02/2006    Past Surgical History:  Procedure Laterality Date  . BREAST BIOPSY Left    core biopsy  . CESAREAN SECTION     x2  . NASAL ENDOSCOPY Left 09/13/2015   Procedure: NASAL ENDOSCOPY WITH NASAL  INTABATION;  Surgeon: Jerrell Belfast, MD;  Location: [facility];  Service: ENT;  Laterality: Left;    OB History   None      Home Medications    Prior to Admission medications   Medication Sig Start Date End Date Taking? Authorizing Provider  Acetaminophen 500 MG coapsule  09/18/16   [provider]  albuterol (PROVENTIL HFA;VENTOLIN HFA) 108 (90 BASE) MCG/ACT inhaler Inhale 2 puffs into the lungs every 4 (four) hours as needed for wheezing or shortness of breath. 05/09/13   Janne Napoleon, NP  allopurinol (ZYLOPRIM) 100 MG tablet Take 100 mg by mouth 2 (two) times daily.     [provider]  amLODipine (NORVASC) 5 MG tablet Take 1 tablet (5 mg total) by mouth daily. 09/18/15   Whiteheart, Cristal Ford, NP  aspirin EC 325 MG tablet Take 325 mg by mouth daily.    [provider]  buPROPion (WELLBUTRIN XL) 150 MG 24 hr tablet Take 150 mg by mouth daily.    [provider]  clonazePAM (KLONOPIN) 1 MG tablet Take 1 mg by mouth 2 (two) times daily.     [provider]  cyclobenzaprine (FLEXERIL) 10 MG tablet Take 1 tablet (10 mg total) by mouth 2 (two) times daily as needed for up to 7 days for muscle spasms. 06/11/17 06/18/17  Barry Dienes, NP  Diclofenac Sodium 3 % GEL Place 1 application onto the skin 2 (two) times  daily. To affected area. 02/05/16   Waynetta Pean, PA-C  diphenhydrAMINE (BENADRYL) 25 mg capsule Take 1 capsule (25 mg total) by mouth 2 (two) times daily. 09/18/15 09/21/15  Whiteheart, Cristal Ford, NP  escitalopram (LEXAPRO) 20 MG tablet Take 20 mg by mouth daily.     [provider]  famotidine (PEPCID) 40 MG tablet Take 1 tablet (40 mg total) by mouth daily. 09/18/15   Whiteheart, Cristal Ford, NP  gabapentin (NEURONTIN) 300 MG capsule Take 2 capsules (600 mg total) by mouth 3 (three) times daily. 06/03/17   Newt Minion, MD  glipiZIDE (GLUCOTROL XL) 2.5 MG 24 hr tablet Take 2.5 mg by mouth daily.    [provider]  meloxicam (MOBIC) 7.5  MG tablet Take 1 tablet (7.5 mg total) by mouth daily. 12/23/16   Zigmund Gottron, NP  metFORMIN (GLUCOPHAGE) 500 MG tablet Take 500 mg by mouth 2 (two) times daily with a meal.    [provider]  nabumetone (RELAFEN) 750 MG tablet Take 1 tablet (750 mg total) by mouth 2 (two) times daily as needed for moderate pain. 05/03/17   Newt Minion, MD  nabumetone (RELAFEN) 750 MG tablet Take 1 tablet (750 mg total) by mouth 2 (two) times daily as needed for mild pain or moderate pain. with food 06/03/17 07/03/17  Newt Minion, MD  naproxen (NAPROSYN) 500 MG tablet TAKE 1 TABLET TWO TIMES DAILY BY MOUTH AS NEEDED FOR MILD PAIN OR MODERATE PAIN 01/23/17   Newt Minion, MD  predniSONE (DELTASONE) 10 MG tablet Take 2 tablets (20 mg total) by mouth daily with breakfast. 05/14/17   Newt Minion, MD  predniSONE (DELTASONE) 20 MG tablet Take 2 tablets daily with breakfast. 04/07/17   Jaynee Eagles, PA-C  spironolactone (ALDACTONE) 50 MG tablet Take 50 mg by mouth daily.    [provider]  VOLTAREN 1 % GEL  09/13/16   [provider]  ziprasidone (GEODON) 80 MG capsule Take 240 mg by mouth every evening. 07/13/15   [provider]    Family History No family history on file.  Social History Social History   Tobacco Use  . Smoking status: Current Every Day Smoker    Packs/day: 0.33    Types: Cigarettes  . Smokeless tobacco: Never Used  Substance Use Topics  . Alcohol use: No  . Drug use: No     Allergies   Lisinopril; Chantix [varenicline]; Doxycycline; Ibuprofen; Lamisil [terbinafine]; Cephalexin; and Sulfa antibiotics   Review of Systems Review of Systems  Constitutional:       See HPI     Physical Exam Triage Vital Signs ED Triage Vitals [06/11/17 1020]  Enc Vitals Group     BP 136/78     Pulse Rate (!) 109     Resp 18     Temp 98.3 F (36.8 C)     Temp src      SpO2 98 %     Weight      Height      Head Circumference      Peak Flow      Pain  Score      Pain Loc      Pain Edu?      Excl. in [place]?    No data found.  Updated Vital Signs BP 136/78   Pulse (!) 109   Temp 98.3 F (36.8 C)   Resp 18   LMP 05/09/2010   SpO2 98%  Physical Exam  Constitutional: She is oriented to person, place, and time. She appears well-developed and well-nourished.  Cardiovascular: Normal rate, regular rhythm and normal heart sounds.  No murmur heard. Pulmonary/Chest: Effort normal and breath sounds normal. She has no wheezes.  Musculoskeletal:  Gait appropriate with the cane. Has full ROM on lower extremities. Pulses intact. Strength appropriate. No unilateral swelling or redness noted.   Neurological: She is alert and oriented to person, place, and time.  Skin: Skin is warm and dry.  Nursing note and vitals reviewed.    UC Treatments / Results  Labs (all labs ordered are listed, but only abnormal results are displayed) Labs Reviewed - No data to display  EKG None Radiology No results found.  Procedures Procedures (including critical care time)  Medications Ordered in UC Medications - No data to display   Initial Impression / Assessment and Plan / UC Course  I have reviewed the triage vital signs and the nursing notes.  Pertinent labs & imaging results that were available during my care of the patient were reviewed by me and considered in my medical decision making (see chart for details).  Final Clinical Impressions(s) / UC Diagnoses   Final diagnoses:  Bilateral leg pain   Physical examination per night.  Note from last visit with orthopedic reviewed.  Please continue taking your gabapentin.   Start the Relafen 750 mg twice daily once you received it in the mail, this medicine was prescribed by your orthopedic specialist.   I have send a muscle relaxer to your pharmacy, it is call Cyclobenzaprine (flexeril), you may take this twice daily as needed. Check your medication bottle at home and make sure that you are not  already taking this medicine.   Please follow up with your orthopedic specialist if you do not improve.   ED Discharge Orders        Ordered    cyclobenzaprine (FLEXERIL) 10 MG tablet  2 times daily PRN     06/11/17 1038     Controlled Substance Prescriptions  Controlled Substance Registry consulted? Not Applicable   Barry Dienes, NP 06/11/17 1042

## 2017-06-11 NOTE — Discharge Instructions (Addendum)
Please continue taking your gabapentin.   Start the Relafen 750 mg twice daily once you received it in the mail, this medicine was prescribed by your orthopedic specialist.   I have send a muscle relaxer to your pharmacy, it is call Cyclobenzaprine (flexeril), you may take this twice daily as needed. Check your medication bottle at home and make sure that you are not already taking this medicine.

## 2017-06-11 NOTE — Telephone Encounter (Signed)
I called and sw pt to advise of message below. Pt states that she is aware she spoke with the pharm yesterday and has no problems with this.

## 2017-06-11 NOTE — ED Triage Notes (Signed)
Pt c/o muscle spasms in her legs, all the way down to her feet. Pt states steroids have helped before.

## 2017-06-17 ENCOUNTER — Encounter (HOSPITAL_COMMUNITY): Payer: Self-pay | Admitting: Emergency Medicine

## 2017-06-17 ENCOUNTER — Other Ambulatory Visit: Payer: Self-pay

## 2017-06-17 ENCOUNTER — Emergency Department (HOSPITAL_COMMUNITY)
Admission: EM | Admit: 2017-06-17 | Discharge: 2017-06-17 | Disposition: A | Discharge status: Home / Self Care | Payer: Medicare HMO | Attending: Emergency Medicine | Admitting: Emergency Medicine

## 2017-06-17 DIAGNOSIS — M5442 Lumbago with sciatica, left side: Secondary | ICD-10-CM | POA: Diagnosis not present

## 2017-06-17 DIAGNOSIS — M5441 Lumbago with sciatica, right side: Secondary | ICD-10-CM | POA: Diagnosis not present

## 2017-06-17 DIAGNOSIS — Z79899 Other long term (current) drug therapy: Secondary | ICD-10-CM | POA: Diagnosis not present

## 2017-06-17 DIAGNOSIS — E119 Type 2 diabetes mellitus without complications: Secondary | ICD-10-CM | POA: Insufficient documentation

## 2017-06-17 DIAGNOSIS — I10 Essential (primary) hypertension: Secondary | ICD-10-CM | POA: Diagnosis not present

## 2017-06-17 DIAGNOSIS — Z7982 Long term (current) use of aspirin: Secondary | ICD-10-CM | POA: Diagnosis not present

## 2017-06-17 DIAGNOSIS — M79606 Pain in leg, unspecified: Secondary | ICD-10-CM | POA: Diagnosis present

## 2017-06-17 DIAGNOSIS — Z7984 Long term (current) use of oral hypoglycemic drugs: Secondary | ICD-10-CM | POA: Diagnosis not present

## 2017-06-17 DIAGNOSIS — F17210 Nicotine dependence, cigarettes, uncomplicated: Secondary | ICD-10-CM | POA: Insufficient documentation

## 2017-06-17 MED ORDER — LIDOCAINE 5 % EX PTCH: 1.0000 | MEDICATED_PATCH | CUTANEOUS | 0 refills | Status: DC

## 2017-06-17 MED ORDER — METHOCARBAMOL 500 MG PO TABS: 500.0000 mg | ORAL_TABLET | Freq: Three times a day (TID) | ORAL | 0 refills | Status: DC | PRN

## 2017-06-17 NOTE — ED Provider Notes (Signed)
[facility] EMERGENCY DEPARTMENT Provider Note   CSN: [id number] Arrival date & time: 06/17/17  1408     History   Chief Complaint Chief Complaint  Patient presents with  . Leg Pain    HPI Jacqueline Reese is a 62 y.o. female with a hx of DM, tobacco abuse, HTN, schizophrenic disorder, and arthritis who presents to the ED with complaint of bilateral lower back pain radiating down bilateral lower extremities for the past 2-3 weeks.  Patient states pain is constant, an 8/10 in severity, worse with ambulating, somewhat improved with gabapentin and with resting/sleeping. Patient seen at urgent care for same 04/09- given prescription for Flexeril. Additionally seen by her orthopedist 04/01- given gabapentin and Relafen . She took all meds a few times, but stopped taking these because they did not help. States that she has tried Naproxen without relief as well. She has a hx of similar pain. Denies numbness, tingling, weakness, incontinence to bowel/bladder, fever, chills, IV drug use, or hx of cancer.  HPI  Past Medical History:  Diagnosis Date  . Arthritis   . Chronic back pain   . Depression   . Diabetes mellitus without complication ([place])   . Gout   . Hypertension   . Schizophrenic disorder [facility])     Patient Active Problem List   Diagnosis Date Noted  . Angioedema 09/13/2015  . Anaphylaxis   . Hypertension, essential   . Acute hypoxemic respiratory failure ([place])   . Type 2 diabetes mellitus without complication, without long-term current use of insulin ([place])   . DIAB W/O MENTION COMP TYPE II/UNS TYPE UNCNTRL 01/19/2010  . FOOT ULCER, RIGHT 01/19/2010  . MENOPAUSE-RELATED VASOMOTOR SYMPTOMS, HOT FLASHES 10/04/2009  . GOUT, UNSPECIFIED 06/07/2009  . LICHEN SIMPLEX CHRONICUS 11/30/2008  . OTITIS EXTERNA, CHRONIC 04/16/2008  . OBESITY, NOS 05/02/2006  . SCHIZOPHRENIA 05/02/2006  . HYPERTENSION, BENIGN SYSTEMIC 05/02/2006  . GASTROESOPHAGEAL REFLUX, NO  ESOPHAGITIS 05/02/2006    Past Surgical History:  Procedure Laterality Date  . BREAST BIOPSY Left    core biopsy  . CESAREAN SECTION     x2  . NASAL ENDOSCOPY Left 09/13/2015   Procedure: NASAL ENDOSCOPY WITH NASAL INTABATION;  Surgeon: Jerrell Belfast, MD;  Location: [facility];  Service: ENT;  Laterality: Left;     OB History   None      Home Medications    Prior to Admission medications   Medication Sig Start Date End Date Taking? Authorizing Provider  Acetaminophen 500 MG coapsule  09/18/16   [provider]  albuterol (PROVENTIL HFA;VENTOLIN HFA) 108 (90 BASE) MCG/ACT inhaler Inhale 2 puffs into the lungs every 4 (four) hours as needed for wheezing or shortness of breath. 05/09/13   Janne Napoleon, NP  allopurinol (ZYLOPRIM) 100 MG tablet Take 100 mg by mouth 2 (two) times daily.     [provider]  amLODipine (NORVASC) 5 MG tablet Take 1 tablet (5 mg total) by mouth daily. 09/18/15   Whiteheart, Cristal Ford, NP  aspirin EC 325 MG tablet Take 325 mg by mouth daily.    [provider]  buPROPion (WELLBUTRIN XL) 150 MG 24 hr tablet Take 150 mg by mouth daily.    [provider]  clonazePAM (KLONOPIN) 1 MG tablet Take 1 mg by mouth 2 (two) times daily.     [provider]  cyclobenzaprine (FLEXERIL) 10 MG tablet Take 1 tablet (10 mg total) by mouth 2 (two) times daily as needed for up  to 7 days for muscle spasms. 06/11/17 06/18/17  Barry Dienes, NP  Diclofenac Sodium 3 % GEL Place 1 application onto the skin 2 (two) times daily. To affected area. 02/05/16   Waynetta Pean, PA-C  diphenhydrAMINE (BENADRYL) 25 mg capsule Take 1 capsule (25 mg total) by mouth 2 (two) times daily. 09/18/15 09/21/15  Whiteheart, Cristal Ford, NP  escitalopram (LEXAPRO) 20 MG tablet Take 20 mg by mouth daily.     [provider]  famotidine (PEPCID) 40 MG tablet Take 1 tablet (40 mg total) by mouth daily. 09/18/15   Whiteheart, Cristal Ford, NP  gabapentin (NEURONTIN) 300 MG  capsule Take 2 capsules (600 mg total) by mouth 3 (three) times daily. 06/03/17   Newt Minion, MD  glipiZIDE (GLUCOTROL XL) 2.5 MG 24 hr tablet Take 2.5 mg by mouth daily.    [provider]  meloxicam (MOBIC) 7.5 MG tablet Take 1 tablet (7.5 mg total) by mouth daily. 12/23/16   Zigmund Gottron, NP  metFORMIN (GLUCOPHAGE) 500 MG tablet Take 500 mg by mouth 2 (two) times daily with a meal.    [provider]  nabumetone (RELAFEN) 750 MG tablet Take 1 tablet (750 mg total) by mouth 2 (two) times daily as needed for moderate pain. 05/03/17   Newt Minion, MD  nabumetone (RELAFEN) 750 MG tablet Take 1 tablet (750 mg total) by mouth 2 (two) times daily as needed for mild pain or moderate pain. with food 06/03/17 07/03/17  Newt Minion, MD  naproxen (NAPROSYN) 500 MG tablet TAKE 1 TABLET TWO TIMES DAILY BY MOUTH AS NEEDED FOR MILD PAIN OR MODERATE PAIN 01/23/17   Newt Minion, MD  predniSONE (DELTASONE) 10 MG tablet Take 2 tablets (20 mg total) by mouth daily with breakfast. 05/14/17   Newt Minion, MD  predniSONE (DELTASONE) 20 MG tablet Take 2 tablets daily with breakfast. 04/07/17   Jaynee Eagles, PA-C  spironolactone (ALDACTONE) 50 MG tablet Take 50 mg by mouth daily.    [provider]  VOLTAREN 1 % GEL  09/13/16   [provider]  ziprasidone (GEODON) 80 MG capsule Take 240 mg by mouth every evening. 07/13/15   [provider]    Family History No family history on file.  Social History Social History   Tobacco Use  . Smoking status: Current Every Day Smoker    Packs/day: 0.33    Types: Cigarettes  . Smokeless tobacco: Never Used  Substance Use Topics  . Alcohol use: No  . Drug use: No     Allergies   Lisinopril; Chantix [varenicline]; Doxycycline; Ibuprofen; Lamisil [terbinafine]; Cephalexin; and Sulfa antibiotics   Review of Systems Review of Systems Constitutional: Negative for chills and fever.  Genitourinary: Negative for dysuria.    Musculoskeletal: Positive for back pain and myalgias (buttocks- BLE).  Neurological: Negative for weakness and numbness.        Negative for incontinence. Negative for saddle anesthesia.     Physical Exam Updated Vital Signs BP 133/88 (BP Location: Right Arm)   Pulse 91   Temp 98 F (36.7 C) (Oral)   Resp 16   LMP 05/09/2010   SpO2 96%   Physical Exam Physical Exam  Constitutional: He appears well-developed and well-nourished. No distress.  HENT:  Head: Normocephalic and atraumatic.  Eyes: Conjunctivae are normal. Right eye exhibits no discharge. Left eye exhibits no discharge.  Cardiovascular:  Pulses:      Popliteal pulses are 2+ on the right  side, and 2+ on the left side.  Abdominal: Soft. He exhibits no distension. There is no tenderness.  Musculoskeletal:  No obvious deformities, appreciable swelling, erythema, ecchymosis, overlying warmth, or edema. Back: No midline tenderness.  There is tenderness to palpation to bilateral lumbar paraspinal muscles and bilateral buttock regions. Extremities: Patient has normal range of motion of all joints.  She is diffusely tender to palpation to the upper and lower portion of the legs posteriorly.  No point/focal tenderness.  Neurological: He is alert.  Clear speech.  Sensation grossly intact bilateral lower extremities.  Patient has 5 out of 5 strength with hip flexion/abduction/adduction, knee flexion/extension, ankle plantar/dorsiflexion.  Patellar DTRs are 2+ and symmetric.  Gait is somewhat antalgic but intact, patient ambulates with cane at baseline.  Psychiatric: He has a normal mood and affect. His behavior is normal. Thought content normal.  Nursing note and vitals reviewed.  ED Treatments / Results  Labs (all labs ordered are listed, but only abnormal results are displayed) Labs Reviewed - No data to display  EKG None  Radiology No results found.  Procedures Procedures (including critical care time)  Medications  Ordered in ED Medications - No data to display   Initial Impression / Assessment and Plan / ED Course  I have reviewed the triage vital signs and the nursing notes.  Pertinent labs & imaging results that were available during my care of the patient were reviewed by me and considered in my medical decision making (see chart for details).    Patient presents with complaint of bilateral lower back/buttocks pain radiating down BLE.  Patient is nontoxic appearing, vitals are WNL. Patient has no neurological deficits or point vertebral tenderness.  Patient can walk but states is painful.  No loss of bowel or bladder control. No saddle anesthesia.  No concern for cauda equina.  No fever, night sweats, weight loss, h/o cancer, or IVDU. No erythema or warmth to indicate infectious etiology. No edema, given bilateral low suspicion for DVT. Somewhat suspicions for sciatica.  Will treat with Lidoderm patches and Robaxin, discussed with patient that they are not to drive or operate heavy machinery while taking Robaxin. Recommended continued use of NSAID and Gabapentin. I discussed treatment plan, need for PCP/[place] follow-up, and return precautions with the patient. Provided opportunity for questions, patient confirmed understanding and is in agreement with plan.   Final Clinical Impressions(s) / ED Diagnoses   Final diagnoses:  Bilateral low back pain with bilateral sciatica, unspecified chronicity    ED Discharge Orders        Ordered    methocarbamol (ROBAXIN) 500 MG tablet  Every 8 hours PRN     06/17/17 1730    lidocaine (LIDODERM) 5 %  Every 24 hours     06/17/17 1730       Anis Degidio, [place] R, PA-C 06/17/17 1831    Pattricia Boss, MD 06/18/17 0000

## 2017-06-17 NOTE — ED Triage Notes (Signed)
Pt states for the last 2 weeks she has had bilateral leg pain that starts in buttocks area and goes into knees. Pt states with ambulation she has "popping" in joints

## 2017-06-17 NOTE — Discharge Instructions (Addendum)
Back Pain: Prescriptions: - Robaxin is the muscle relaxer I have prescribed, this is meant to help with muscle tightness. Be aware that this medication may make you drowsy therefore the first time you take this it should be at a time you are in an environment where you can rest. Do not drive or operate heavy machinery when taking this medication.  - Lidoderm patches-you may apply these patches once per day to area of discomfort.  These are expensive at the pharmacy as per the over-the-counter version.  We have prescribed you new medication(s) today. Discuss the medications prescribed today with your pharmacist as they can have adverse effects and interactions with your other medicines including over the counter and prescribed medications. Seek medical evaluation if you start to experience new or abnormal symptoms after taking one of these medicines, seek care immediately if you start to experience difficulty breathing, feeling of your throat closing, facial swelling, or rash as these could be indications of a more serious allergic reaction   Continue to take the Mobic prescribed at urgent care.  In addition you may also take Tylenol. Tylenol is generally safe, though you should not take more than 8 of the extra strength (500mg ) pills a day.  The application of heat can help soothe the pain.  Maintaining your daily activities, including walking, is encourged, as it will help you get better faster than just staying in bed.  Low back pain is discomfort in the lower back that may be due to injuries to muscles and ligaments around the spine.  Occasionally, it may be caused by a a problem to a part of the spine called a disc.  The pain may last several days or a few weeks.   Your pain should get better over the next 2 weeks.  You will need to follow up with  Your primary healthcare provider in 1 week for reassessment, if you do not have a primary care provider one is provided in your discharge instructions.  Additionally follow up with your orthopedic doctor within the week. However if you develop severe or worsening pain, low back pain with fever, numbness, weakness, loss of bowel or bladder control, or inability to walk or urinate, you should return to the ER immediately.  Please follow up with your doctor this week for a recheck if still having symptoms.

## 2017-07-02 ENCOUNTER — Other Ambulatory Visit (INDEPENDENT_AMBULATORY_CARE_PROVIDER_SITE_OTHER): Payer: Self-pay | Admitting: Orthopedic Surgery

## 2017-07-02 ENCOUNTER — Telehealth (INDEPENDENT_AMBULATORY_CARE_PROVIDER_SITE_OTHER): Payer: Self-pay | Admitting: Orthopedic Surgery

## 2017-07-02 MED ORDER — PREDNISONE 10 MG PO TABS: 20.0000 mg | ORAL_TABLET | Freq: Every day | ORAL | 0 refills | Status: DC

## 2017-07-02 NOTE — Telephone Encounter (Signed)
I called and sw pt to advise that this rx has been faxed to pharm.

## 2017-07-02 NOTE — Telephone Encounter (Signed)
Pt was sen in ER last week and advised to take Robaxin and Lidoderm patches as well as her NSAID and Neurontin for her LBP with rad s/s down BLE she is asking for a refill on her prednisone. Please advise.

## 2017-07-02 NOTE — Telephone Encounter (Signed)
Patient said she needs refill on prednisone # [phone]

## 2017-07-02 NOTE — Telephone Encounter (Signed)
rx sent to [facility]

## 2017-07-05 ENCOUNTER — Other Ambulatory Visit (INDEPENDENT_AMBULATORY_CARE_PROVIDER_SITE_OTHER): Payer: Self-pay | Admitting: Orthopedic Surgery

## 2017-07-08 ENCOUNTER — Telehealth (INDEPENDENT_AMBULATORY_CARE_PROVIDER_SITE_OTHER): Payer: Self-pay | Admitting: Orthopedic Surgery

## 2017-07-08 NOTE — Telephone Encounter (Signed)
Can you please call pt and make appt? Multiple requests for medication

## 2017-07-08 NOTE — Telephone Encounter (Signed)
Patient called asking for some more pain medication states she's still experiencing a lot of pain. CB # [phone]

## 2017-07-08 NOTE — Telephone Encounter (Signed)
Left message to return call and make appt with Dr. Sharol Given

## 2017-07-10 ENCOUNTER — Telehealth (INDEPENDENT_AMBULATORY_CARE_PROVIDER_SITE_OTHER): Payer: Self-pay | Admitting: Orthopedic Surgery

## 2017-07-10 NOTE — Telephone Encounter (Signed)
Please advise 

## 2017-07-10 NOTE — Telephone Encounter (Signed)
Patient called back top advise she stopped the prednisone because it wasn't helping her, shes also experiencing pain and soreness in her elbow as well as going down her legs. She said she tried doing the exercises and drinking coconut water but shes really sore from the exercises. If Dr. Sharol Given wishes for her to continue please advise # [phone]

## 2017-07-10 NOTE — Telephone Encounter (Signed)
Call in prescription for Ultram and make sure that she is not taking any of the anti-inflammatories including Relafen or the prednisone

## 2017-07-10 NOTE — Telephone Encounter (Signed)
Patient called stating she is experiencing a lot of pain and would like to prescribed something to help. CB # [phone]

## 2017-07-11 ENCOUNTER — Telehealth (INDEPENDENT_AMBULATORY_CARE_PROVIDER_SITE_OTHER): Payer: Self-pay | Admitting: Orthopedic Surgery

## 2017-07-11 MED ORDER — TRAMADOL HCL 50 MG PO TABS: 50.0000 mg | ORAL_TABLET | Freq: Two times a day (BID) | ORAL | 0 refills | Status: DC | PRN

## 2017-07-11 NOTE — Telephone Encounter (Signed)
Patient called concerning the pain she is having in her hip and leg. Patient asked if a Rx has been called into her pharmacy. The number to contact patient is [phone]

## 2017-07-11 NOTE — Telephone Encounter (Signed)
Rx request 

## 2017-07-11 NOTE — Telephone Encounter (Signed)
Jacqueline Reese called in medication

## 2017-07-11 NOTE — Telephone Encounter (Signed)
FYI..I called patient and advised. She stopped the Relafen and Prednisone. She denies taking any other anti inflammatories. She asked me to cancel her appt for Tuesday. I did ask patient to reschedule, especially due to increased pain she is having.  She states that she will have to call back to reschedule.

## 2017-07-11 NOTE — Addendum Note (Signed)
Addended by: Meyer Cory on: 07/11/2017 04:55 PM   Modules accepted: Orders

## 2017-07-11 NOTE — Telephone Encounter (Signed)
Note written yesterday for Ultram for pain.

## 2017-07-16 ENCOUNTER — Ambulatory Visit (INDEPENDENT_AMBULATORY_CARE_PROVIDER_SITE_OTHER): Payer: Medicare HMO | Admitting: Orthopedic Surgery

## 2017-07-17 ENCOUNTER — Telehealth (INDEPENDENT_AMBULATORY_CARE_PROVIDER_SITE_OTHER): Payer: Self-pay | Admitting: Orthopedic Surgery

## 2017-07-17 NOTE — Telephone Encounter (Signed)
Patient called stated that taking Tramadol and need something stronger for pain.  Please call patient to advise

## 2017-07-18 ENCOUNTER — Ambulatory Visit (INDEPENDENT_AMBULATORY_CARE_PROVIDER_SITE_OTHER): Payer: Medicare HMO | Admitting: Orthopedic Surgery

## 2017-07-18 NOTE — Telephone Encounter (Signed)
Called pt and advised that we can not give rx for stronger pain medication. Advised we are happy to see her whenever she would like to come in.

## 2017-07-19 ENCOUNTER — Other Ambulatory Visit (INDEPENDENT_AMBULATORY_CARE_PROVIDER_SITE_OTHER): Payer: Self-pay | Admitting: Orthopedic Surgery

## 2017-07-22 ENCOUNTER — Telehealth (INDEPENDENT_AMBULATORY_CARE_PROVIDER_SITE_OTHER): Payer: Self-pay | Admitting: Orthopedic Surgery

## 2017-07-22 NOTE — Telephone Encounter (Signed)
Patient left a voicemail requesting medication because her legs and feet are numb. Please advise # [phone]

## 2017-07-23 ENCOUNTER — Telehealth (INDEPENDENT_AMBULATORY_CARE_PROVIDER_SITE_OTHER): Payer: Self-pay | Admitting: Orthopedic Surgery

## 2017-07-23 NOTE — Telephone Encounter (Signed)
Duplicate message. 

## 2017-07-23 NOTE — Telephone Encounter (Signed)
Patient called asking for a refill on her pain medication. States she's still having a lot of pain. CB # [phone]

## 2017-07-24 ENCOUNTER — Other Ambulatory Visit (INDEPENDENT_AMBULATORY_CARE_PROVIDER_SITE_OTHER): Payer: Self-pay | Admitting: Orthopedic Surgery

## 2017-07-24 NOTE — Telephone Encounter (Signed)
This pt calls every other day asking for a variety of medications for pain. Last refill was for tramadol 07/11/17 #40 she has had Relafen, naprosyn, prednisone as well. We have treated in the office she has been to the ER what should we do for this pt?

## 2017-07-27 ENCOUNTER — Encounter (HOSPITAL_COMMUNITY): Payer: Self-pay | Admitting: *Deleted

## 2017-07-27 ENCOUNTER — Emergency Department (HOSPITAL_COMMUNITY)
Admission: EM | Admit: 2017-07-27 | Discharge: 2017-07-27 | Disposition: A | Discharge status: Home / Self Care | Payer: Medicare HMO | Attending: Emergency Medicine | Admitting: Emergency Medicine

## 2017-07-27 ENCOUNTER — Encounter (HOSPITAL_COMMUNITY): Payer: Self-pay | Admitting: Emergency Medicine

## 2017-07-27 ENCOUNTER — Emergency Department (HOSPITAL_BASED_OUTPATIENT_CLINIC_OR_DEPARTMENT_OTHER)
Admit: 2017-07-27 | Discharge: 2017-07-27 | Disposition: A | Discharge status: Home / Self Care | Payer: Medicare HMO | Attending: Emergency Medicine | Admitting: Emergency Medicine

## 2017-07-27 ENCOUNTER — Ambulatory Visit (HOSPITAL_COMMUNITY)
Admission: EM | Admit: 2017-07-27 | Discharge: 2017-07-27 | Disposition: A | Discharge status: Home / Self Care | Payer: Medicare HMO | Source: Home / Self Care

## 2017-07-27 ENCOUNTER — Other Ambulatory Visit: Payer: Self-pay

## 2017-07-27 DIAGNOSIS — I10 Essential (primary) hypertension: Secondary | ICD-10-CM | POA: Diagnosis not present

## 2017-07-27 DIAGNOSIS — R609 Edema, unspecified: Secondary | ICD-10-CM | POA: Diagnosis not present

## 2017-07-27 DIAGNOSIS — E119 Type 2 diabetes mellitus without complications: Secondary | ICD-10-CM | POA: Diagnosis not present

## 2017-07-27 DIAGNOSIS — Z79899 Other long term (current) drug therapy: Secondary | ICD-10-CM | POA: Insufficient documentation

## 2017-07-27 DIAGNOSIS — M79604 Pain in right leg: Secondary | ICD-10-CM

## 2017-07-27 DIAGNOSIS — Z7982 Long term (current) use of aspirin: Secondary | ICD-10-CM | POA: Diagnosis not present

## 2017-07-27 DIAGNOSIS — Z7984 Long term (current) use of oral hypoglycemic drugs: Secondary | ICD-10-CM | POA: Insufficient documentation

## 2017-07-27 DIAGNOSIS — M79605 Pain in left leg: Secondary | ICD-10-CM

## 2017-07-27 DIAGNOSIS — M7989 Other specified soft tissue disorders: Secondary | ICD-10-CM

## 2017-07-27 DIAGNOSIS — R2243 Localized swelling, mass and lump, lower limb, bilateral: Secondary | ICD-10-CM | POA: Diagnosis present

## 2017-07-27 DIAGNOSIS — L03115 Cellulitis of right lower limb: Secondary | ICD-10-CM | POA: Diagnosis not present

## 2017-07-27 DIAGNOSIS — F17210 Nicotine dependence, cigarettes, uncomplicated: Secondary | ICD-10-CM | POA: Diagnosis not present

## 2017-07-27 LAB — CBC
HCT: 37.6 % (ref 36.0–46.0)
Hemoglobin: 12.2 g/dL (ref 12.0–15.0)
MCH: 30.3 pg (ref 26.0–34.0)
MCHC: 32.4 g/dL (ref 30.0–36.0)
MCV: 93.3 fL (ref 78.0–100.0)
PLATELETS: 496 10*3/uL — AB (ref 150–400)
RBC: 4.03 MIL/uL (ref 3.87–5.11)
RDW: 14.2 % (ref 11.5–15.5)
WBC: 12.3 10*3/uL — ABNORMAL HIGH (ref 4.0–10.5)

## 2017-07-27 LAB — BASIC METABOLIC PANEL
Anion gap: 13 (ref 5–15)
BUN: 9 mg/dL (ref 6–20)
CALCIUM: 9.2 mg/dL (ref 8.9–10.3)
CO2: 20 mmol/L — AB (ref 22–32)
CREATININE: 0.52 mg/dL (ref 0.44–1.00)
Chloride: 105 mmol/L (ref 101–111)
GFR calc non Af Amer: >60 mL/min (ref >60)
GLUCOSE: 99 mg/dL (ref 65–99)
Potassium: 4.3 mmol/L (ref 3.5–5.1)
Sodium: 138 mmol/L (ref 135–145)

## 2017-07-27 LAB — I-STAT TROPONIN, ED: Troponin i, poc: 0 ng/mL (ref 0.00–0.08)

## 2017-07-27 MED ORDER — DOXYCYCLINE HYCLATE 100 MG PO TABS
100.0000 mg | ORAL_TABLET | Freq: Once | ORAL | Status: AC
  Administered 2017-07-27: 100 mg via ORAL
  Filled 2017-07-27: qty 1

## 2017-07-27 MED ORDER — TRAMADOL HCL 50 MG PO TABS: 50.0000 mg | ORAL_TABLET | Freq: Four times a day (QID) | ORAL | 0 refills | Status: DC | PRN

## 2017-07-27 MED ORDER — HYDROCODONE-ACETAMINOPHEN 5-325 MG PO TABS
1.0000 | ORAL_TABLET | Freq: Once | ORAL | Status: AC
Start: 2017-07-27 — End: 2017-07-27
  Administered 2017-07-27: 1 via ORAL
  Filled 2017-07-27: qty 1

## 2017-07-27 MED ORDER — ACETAMINOPHEN 325 MG PO TABS
650.0000 mg | ORAL_TABLET | Freq: Once | ORAL | Status: AC
  Administered 2017-07-27: 650 mg via ORAL
  Filled 2017-07-27: qty 2

## 2017-07-27 MED ORDER — DOXYCYCLINE HYCLATE 100 MG PO CAPS: 100.0000 mg | ORAL_CAPSULE | Freq: Two times a day (BID) | ORAL | 0 refills | Status: AC

## 2017-07-27 NOTE — ED Provider Notes (Signed)
[facility] EMERGENCY DEPARTMENT Provider Note   CSN: [id number] Arrival date & time: 07/27/17  1357     History   Chief Complaint Chief Complaint  Patient presents with  . Leg Swelling    HPI Jacqueline Reese is a 62 y.o. female with past medical history significant for chronic back pain, diabetes, gout, hypertension, depression and schizophrenic disorder presenting from urgent care with progressive onset lower extremity edema and pain which is worse on the right side.  She reports that this has been progressing over the last 2 weeks.  She states that the area was initially swollen and tender but without erythema or warmth and she was seen by her PCP.  No relief with over-the-counter medications.  Patient denies chest pain, shortness of breath, fever, chills, nausea or vomiting.  HPI  Past Medical History:  Diagnosis Date  . Arthritis   . Chronic back pain   . Depression   . Diabetes mellitus without complication ([place])   . Gout   . Hypertension   . Schizophrenic disorder [facility])     Patient Active Problem List   Diagnosis Date Noted  . Angioedema 09/13/2015  . Anaphylaxis   . Hypertension, essential   . Acute hypoxemic respiratory failure ([place])   . Type 2 diabetes mellitus without complication, without long-term current use of insulin ([place])   . DIAB W/O MENTION COMP TYPE II/UNS TYPE UNCNTRL 01/19/2010  . FOOT ULCER, RIGHT 01/19/2010  . MENOPAUSE-RELATED VASOMOTOR SYMPTOMS, HOT FLASHES 10/04/2009  . GOUT, UNSPECIFIED 06/07/2009  . LICHEN SIMPLEX CHRONICUS 11/30/2008  . OTITIS EXTERNA, CHRONIC 04/16/2008  . OBESITY, NOS 05/02/2006  . SCHIZOPHRENIA 05/02/2006  . HYPERTENSION, BENIGN SYSTEMIC 05/02/2006  . GASTROESOPHAGEAL REFLUX, NO ESOPHAGITIS 05/02/2006    Past Surgical History:  Procedure Laterality Date  . BREAST BIOPSY Left    core biopsy  . CESAREAN SECTION     x2  . NASAL ENDOSCOPY Left 09/13/2015   Procedure: NASAL ENDOSCOPY WITH NASAL  INTABATION;  Surgeon: Jerrell Belfast, MD;  Location: [facility];  Service: ENT;  Laterality: Left;     OB History   None      Home Medications    Prior to Admission medications   Medication Sig Start Date End Date Taking? Authorizing Provider  Acetaminophen 500 MG coapsule  09/18/16   [provider]  albuterol (PROVENTIL HFA;VENTOLIN HFA) 108 (90 BASE) MCG/ACT inhaler Inhale 2 puffs into the lungs every 4 (four) hours as needed for wheezing or shortness of breath. 05/09/13   Janne Napoleon, NP  allopurinol (ZYLOPRIM) 100 MG tablet Take 100 mg by mouth 2 (two) times daily.     [provider]  amLODipine (NORVASC) 5 MG tablet Take 1 tablet (5 mg total) by mouth daily. 09/18/15   Whiteheart, Cristal Ford, NP  aspirin EC 325 MG tablet Take 325 mg by mouth daily.    [provider]  buPROPion (WELLBUTRIN XL) 150 MG 24 hr tablet Take 150 mg by mouth daily.    [provider]  clonazePAM (KLONOPIN) 1 MG tablet Take 1 mg by mouth 2 (two) times daily.     [provider]  Diclofenac Sodium 3 % GEL Place 1 application onto the skin 2 (two) times daily. To affected area. 02/05/16   Waynetta Pean, PA-C  diphenhydrAMINE (BENADRYL) 25 mg capsule Take 1 capsule (25 mg total) by mouth 2 (two) times daily. 09/18/15 09/21/15  Marijean Heath, NP  doxycycline (VIBRAMYCIN) 100 MG capsule Take 1  capsule (100 mg total) by mouth 2 (two) times daily for 7 days. 07/27/17 08/03/17  Avie Echevaria B, PA-C  escitalopram (LEXAPRO) 20 MG tablet Take 20 mg by mouth daily.     [provider]  famotidine (PEPCID) 40 MG tablet Take 1 tablet (40 mg total) by mouth daily. 09/18/15   Whiteheart, Cristal Ford, NP  gabapentin (NEURONTIN) 300 MG capsule Take 2 capsules (600 mg total) by mouth 3 (three) times daily. 06/03/17   Newt Minion, MD  glipiZIDE (GLUCOTROL XL) 2.5 MG 24 hr tablet Take 2.5 mg by mouth daily.    [provider]  lidocaine (LIDODERM) 5 % Place 1 patch  onto the skin daily. Remove & Discard patch within 12 hours or as directed by MD 06/17/17   Petrucelli, Glynda Jaeger, PA-C  meloxicam (MOBIC) 7.5 MG tablet Take 1 tablet (7.5 mg total) by mouth daily. 12/23/16   Zigmund Gottron, NP  metFORMIN (GLUCOPHAGE) 500 MG tablet Take 500 mg by mouth 2 (two) times daily with a meal.    [provider]  methocarbamol (ROBAXIN) 500 MG tablet Take 1 tablet (500 mg total) by mouth every 8 (eight) hours as needed. 06/17/17   Petrucelli, Samantha R, PA-C  nabumetone (RELAFEN) 750 MG tablet Take 1 tablet (750 mg total) by mouth 2 (two) times daily as needed for moderate pain. 05/03/17   Newt Minion, MD  naproxen (NAPROSYN) 500 MG tablet TAKE 1 TABLET TWO TIMES DAILY BY MOUTH AS NEEDED FOR MILD PAIN OR MODERATE PAIN 01/23/17   Newt Minion, MD  predniSONE (DELTASONE) 10 MG tablet Take 2 tablets (20 mg total) by mouth daily with breakfast. 07/02/17   Newt Minion, MD  predniSONE (DELTASONE) 20 MG tablet Take 2 tablets daily with breakfast. 04/07/17   Jaynee Eagles, PA-C  spironolactone (ALDACTONE) 50 MG tablet Take 50 mg by mouth daily.    [provider]  traMADol (ULTRAM) 50 MG tablet Take 1 tablet (50 mg total) by mouth every 6 (six) hours as needed. 07/27/17   Avie Echevaria B, PA-C  VOLTAREN 1 % GEL  09/13/16   [provider]  ziprasidone (GEODON) 80 MG capsule Take 240 mg by mouth every evening. 07/13/15   [provider]    Family History History reviewed. No pertinent family history.  Social History Social History   Tobacco Use  . Smoking status: Current Every Day Smoker    Packs/day: 0.33    Types: Cigarettes  . Smokeless tobacco: Never Used  Substance Use Topics  . Alcohol use: No  . Drug use: No     Allergies   Lisinopril; Chantix [varenicline]; Doxycycline; Ibuprofen; Lamisil [terbinafine]; Cephalexin; and Sulfa antibiotics   Review of Systems Review of Systems  Constitutional: Negative for chills,  diaphoresis, fatigue and fever.  HENT: Negative for congestion.   Eyes: Negative for visual disturbance.  Respiratory: Negative for cough, choking, chest tightness, shortness of breath, wheezing and stridor.   Cardiovascular: Positive for leg swelling. Negative for chest pain and palpitations.  Gastrointestinal: Negative for abdominal pain, nausea and vomiting.  Genitourinary: Negative for difficulty urinating, dysuria and flank pain.  Musculoskeletal: Positive for myalgias. Negative for arthralgias, back pain, gait problem, joint swelling, neck pain and neck stiffness.  Skin: Positive for color change. Negative for pallor, rash and wound.  Neurological: Negative for dizziness, weakness, light-headedness, numbness and headaches.  Psychiatric/Behavioral: Negative for behavioral problems.     Physical Exam Updated Vital Signs BP 121/77 (BP  Location: Left Arm)   Pulse 89   Temp 98.1 F (36.7 C) (Oral)   Resp 16   LMP 05/09/2010   SpO2 97%   Physical Exam  Constitutional: She appears well-developed and well-nourished. No distress.  Afebrile, nontoxic appearing in no acute distress, sitting comfortably in bed  HENT:  Head: Atraumatic.  Eyes: Right eye exhibits no discharge. Left eye exhibits no discharge.  Neck: Normal range of motion.  Cardiovascular: Normal rate, regular rhythm, normal heart sounds and intact distal pulses.  Pulmonary/Chest: Effort normal and breath sounds normal. No stridor. No respiratory distress. She has no wheezes.  Abdominal: She exhibits no distension.  Musculoskeletal: Normal range of motion. She exhibits edema and tenderness. She exhibits no deformity.  Dorsalis pedis pulses confirmed on Doppler.  2+ pitting edema, erythema and warmth to the right anterior lower extremity and dorsum of the foot.  Neurological: She is alert. No sensory deficit. She exhibits normal muscle tone.  Proprioception and sensation intact distally  Skin: Skin is warm and dry.  Capillary refill takes less than 2 seconds. She is not diaphoretic. There is erythema. No pallor.  Psychiatric: She has a normal mood and affect. Her behavior is normal.  Nursing note and vitals reviewed.    ED Treatments / Results  Labs (all labs ordered are listed, but only abnormal results are displayed) Labs Reviewed  BASIC METABOLIC PANEL - Abnormal; Notable for the following components:      Result Value   CO2 20 (*)    All other components within normal limits  CBC - Abnormal; Notable for the following components:   WBC 12.3 (*)    Platelets 496 (*)    All other components within normal limits  I-STAT TROPONIN, ED    EKG None  Radiology No results found.  Procedures Procedures (including critical care time)  Medications Ordered in ED Medications  HYDROcodone-acetaminophen (NORCO/VICODIN) 5-325 MG per tablet 1 tablet (1 tablet Oral Given 07/27/17 1759)  acetaminophen (TYLENOL) tablet 650 mg (650 mg Oral Given 07/27/17 2007)  doxycycline (VIBRA-TABS) tablet 100 mg (100 mg Oral Given 07/27/17 2007)     Initial Impression / Assessment and Plan / ED Course  I have reviewed the triage vital signs and the nursing notes.  Pertinent labs & imaging results that were available during my care of the patient were reviewed by me and considered in my medical decision making (see chart for details).    Patient presents from urgent care with bilateral lower extremity edema and pain.  Exam she has 2+ pitting edema on the right and tenderness palpation of the anterior lower extremity distally and dorsum of the right foot.  No calf tenderness or edema bilaterally.  She is nontoxic afebrile.  There is erythema and warmth to the right lower extremity concerning for possible cellulitis.  Dorsalis pedis pulses confirmed by Doppler.  Ordered ultrasound vascular DVT which showed no evidence of DVT.  Patient's pain was managed while in the emergency department.  She reported significant  improvement on reassessment reported feeling much better now that she ate a sandwich.  Will discharge home with symptomatic relief, antibiotics and close follow-up with PCP. The patient on the [place] and noted that her last prescription for tramadol was filled on Jul 11, 2017 for 20 days.  Will send patient home with a short supply refill. Patient was given first dose of doxycycline while in the emergency department.  Discussed strict return precautions and advised to return to the emergency  department if experiencing any new or worsening symptoms. Instructions were understood and patient agreed with discharge plan.  Final Clinical Impressions(s) / ED Diagnoses   Final diagnoses:  Peripheral edema  Cellulitis of right lower extremity    ED Discharge Orders        Ordered    doxycycline (VIBRAMYCIN) 100 MG capsule  2 times daily     07/27/17 1942    traMADol (ULTRAM) 50 MG tablet  Every 6 hours PRN     07/27/17 1947       Dossie Der 07/27/17 2015    Dorie Rank, MD 07/29/17 [id number]

## 2017-07-27 NOTE — ED Triage Notes (Signed)
Pt here for bilateral leg pain and swelling and left elbow pain

## 2017-07-27 NOTE — ED Triage Notes (Signed)
Pt sent here from [facility]cc. Pt having leg swelling for weeks, more severe in right leg. Also has left arm pain when ambulating with cane. Has generalized bodyaches.

## 2017-07-27 NOTE — Discharge Instructions (Signed)
Recommend further evaluation and management at ER for Right leg swelling and bilateral lower leg pain Cannot rule out DVT in office Patient aware and in agreement with this plan.

## 2017-07-27 NOTE — ED Provider Notes (Signed)
[facility]   [id number] 07/27/17 Arrival Time: 1208  SUBJECTIVE: History from: patient. Jacqueline Reese is a 62 y.o. female hx significant for DM, HTN, and tobacco use that presents with bilateral leg pain and right leg swelling that began 1 month ago.  Denies a precipitating event or specific injury.  Localizes the pain to the lower legs.  Describes the pain as constant and sharp in character.  Has tried OTC medications like tylenol and ibuprofen without relief.  Symptoms are made worse with weight-bearing and walking.  Reports similar lower leg pain, but denies swelling in the past.  Denies fever, chills, erythema, ecchymosis, weakness, numbness and tingling.      ROS: As per HPI.  Past Medical History:  Diagnosis Date  . Arthritis   . Chronic back pain   . Depression   . Diabetes mellitus without complication ([place])   . Gout   . Hypertension   . Schizophrenic disorder [facility])    Past Surgical History:  Procedure Laterality Date  . BREAST BIOPSY Left    core biopsy  . CESAREAN SECTION     x2  . NASAL ENDOSCOPY Left 09/13/2015   Procedure: NASAL ENDOSCOPY WITH NASAL INTABATION;  Surgeon: Jerrell Belfast, MD;  Location: [facility];  Service: ENT;  Laterality: Left;   Allergies  Allergen Reactions  . Lisinopril Swelling    Angioedema   . Chantix [Varenicline]   . Doxycycline Other (See Comments)    Interactions with other medications  . Ibuprofen Nausea And Vomiting  . Lamisil [Terbinafine] Hives  . Cephalexin Rash  . Sulfa Antibiotics Anxiety   No current facility-administered medications on file prior to encounter.    Current Outpatient Medications on File Prior to Encounter  Medication Sig Dispense Refill  . Acetaminophen 500 MG coapsule     . albuterol (PROVENTIL HFA;VENTOLIN HFA) 108 (90 BASE) MCG/ACT inhaler Inhale 2 puffs into the lungs every 4 (four) hours as needed for wheezing or shortness of breath. 1 Inhaler 0  . allopurinol (ZYLOPRIM) 100 MG tablet  Take 100 mg by mouth 2 (two) times daily.     Marland Kitchen amLODipine (NORVASC) 5 MG tablet Take 1 tablet (5 mg total) by mouth daily. 30 tablet 0  . aspirin EC 325 MG tablet Take 325 mg by mouth daily.    Marland Kitchen buPROPion (WELLBUTRIN XL) 150 MG 24 hr tablet Take 150 mg by mouth daily.    . clonazePAM (KLONOPIN) 1 MG tablet Take 1 mg by mouth 2 (two) times daily.     . Diclofenac Sodium 3 % GEL Place 1 application onto the skin 2 (two) times daily. To affected area. 50 g 0  . diphenhydrAMINE (BENADRYL) 25 mg capsule Take 1 capsule (25 mg total) by mouth 2 (two) times daily. 30 capsule 0  . escitalopram (LEXAPRO) 20 MG tablet Take 20 mg by mouth daily.     . famotidine (PEPCID) 40 MG tablet Take 1 tablet (40 mg total) by mouth daily. 30 tablet 0  . gabapentin (NEURONTIN) 300 MG capsule Take 2 capsules (600 mg total) by mouth 3 (three) times daily. 180 capsule 3  . glipiZIDE (GLUCOTROL XL) 2.5 MG 24 hr tablet Take 2.5 mg by mouth daily.    Marland Kitchen lidocaine (LIDODERM) 5 % Place 1 patch onto the skin daily. Remove & Discard patch within 12 hours or as directed by MD 30 patch 0  . meloxicam (MOBIC) 7.5 MG tablet Take 1 tablet (7.5 mg total) by mouth daily. [place]  tablet 0  . metFORMIN (GLUCOPHAGE) 500 MG tablet Take 500 mg by mouth 2 (two) times daily with a meal.    . methocarbamol (ROBAXIN) 500 MG tablet Take 1 tablet (500 mg total) by mouth every 8 (eight) hours as needed. 30 tablet 0  . nabumetone (RELAFEN) 750 MG tablet Take 1 tablet (750 mg total) by mouth 2 (two) times daily as needed for moderate pain. 180 tablet 3  . naproxen (NAPROSYN) 500 MG tablet TAKE 1 TABLET TWO TIMES DAILY BY MOUTH AS NEEDED FOR MILD PAIN OR MODERATE PAIN 60 tablet 2  . predniSONE (DELTASONE) 10 MG tablet Take 2 tablets (20 mg total) by mouth daily with breakfast. 60 tablet 0  . predniSONE (DELTASONE) 20 MG tablet Take 2 tablets daily with breakfast. 10 tablet 0  . spironolactone (ALDACTONE) 50 MG tablet Take 50 mg by mouth daily.    .  traMADol (ULTRAM) 50 MG tablet Take 1 tablet (50 mg total) by mouth 2 (two) times daily as needed for severe pain. 40 tablet 0  . VOLTAREN 1 % GEL   0  . ziprasidone (GEODON) 80 MG capsule Take 240 mg by mouth every evening.  0   Social History   Socioeconomic History  . Marital status: Legally Separated    Spouse name: Not on file  . Number of children: Not on file  . Years of education: Not on file  . Highest education level: Not on file  Occupational History  . Not on file  Social Needs  . Financial resource strain: Not on file  . Food insecurity:    Worry: Not on file    Inability: Not on file  . Transportation needs:    Medical: Not on file    Non-medical: Not on file  Tobacco Use  . Smoking status: Current Every Day Smoker    Packs/day: 0.33    Types: Cigarettes  . Smokeless tobacco: Never Used  Substance and Sexual Activity  . Alcohol use: No  . Drug use: No  . Sexual activity: Not on file  Lifestyle  . Physical activity:    Days per week: Not on file    Minutes per session: Not on file  . Stress: Not on file  Relationships  . Social connections:    Talks on phone: Not on file    Gets together: Not on file    Attends religious service: Not on file    Active member of club or organization: Not on file    Attends meetings of clubs or organizations: Not on file    Relationship status: Not on file  . Intimate partner violence:    Fear of current or ex partner: Not on file    Emotionally abused: Not on file    Physically abused: Not on file    Forced sexual activity: Not on file  Other Topics Concern  . Not on file  Social History Narrative  . Not on file   History reviewed. No pertinent family history.  OBJECTIVE:  Vitals:   07/27/17 1258  BP: 102/69  Pulse: 87  Resp: 16  Temp: 97.6 F (36.4 C)  TempSrc: Oral  SpO2: 99%    General appearance: AOx3; in no acute distress.  Head: NCAT Lungs: CTA bilaterally Heart: RRR.  Clear S1 and S2 without  murmur, gallops, or rubs.  Radial pulses 2+ bilaterally. Musculoskeletal:  Inspection: Right ankle 1+ pitting edema; warm to the touch (see picture below) Palpation: Diffusely tender about the  right  lower extremity; negative homan's ROM: LROM about the ankle Strength: 5/5 knee abduction, 5/5 knee adduction, 4/5 knee flexion, 4/5 knee extension, 5/5 dorsiflexion, 5/5 plantar flexion CV: difficult to assess dorsalis pedis pulse due to swelling Skin: warm and dry Neurologic: sitting in wheelchair; unable to ambulate without difficulty; Sensation intact about the lower extremities Psychological: alert and cooperative; normal mood and affect      ASSESSMENT & PLAN:  1. Leg pain, bilateral   2. Right leg swelling     No orders of the defined types were placed in this encounter.   Recommend further evaluation and management at ER for Right leg swelling and bilateral lower leg pain Cannot rule out DVT in office Patient aware and in agreement with this plan.    Reviewed expectations re: course of current medical issues. Questions answered. Outlined signs and symptoms indicating need for more acute intervention. Patient verbalized understanding. After Visit Summary given.    Lestine Box, PA-C 07/27/17 1354

## 2017-07-27 NOTE — Progress Notes (Signed)
Bilateral lower extremity venous duplex has been completed. Negative for DVT. Results were given to Avie Echevaria PA.  07/27/17 6:18 PM Carlos Levering RVT

## 2017-07-27 NOTE — Discharge Instructions (Addendum)
As discussed, make sure that you take your entire course of antibiotics even if you feel better.  Drink plenty of fluids and stay well-hydrated. Follow-up with your primary care provider for management of chronic pain medications.   Return if symptoms worsen or new concerning symptoms in the meantime.

## 2017-07-27 NOTE — ED Notes (Signed)
Vascular tech at bedside. °

## 2017-07-30 NOTE — Telephone Encounter (Signed)
Called and lm on vm to advise pt of message below. If she is interested in a referral to a pain clinic she can call and let me know and we would be happy to make that referral for her.

## 2017-07-30 NOTE — Telephone Encounter (Signed)
Call patient, we cannot refill pain medicines for chronic pain.  Patient most likely would need evaluation by a pain clinic.

## 2017-08-01 ENCOUNTER — Ambulatory Visit (INDEPENDENT_AMBULATORY_CARE_PROVIDER_SITE_OTHER): Payer: Medicare HMO | Admitting: Orthopedic Surgery

## 2017-08-12 ENCOUNTER — Ambulatory Visit (INDEPENDENT_AMBULATORY_CARE_PROVIDER_SITE_OTHER): Payer: Medicare HMO | Admitting: Orthopedic Surgery

## 2017-08-12 ENCOUNTER — Telehealth (INDEPENDENT_AMBULATORY_CARE_PROVIDER_SITE_OTHER): Payer: Self-pay | Admitting: Orthopedic Surgery

## 2017-08-12 NOTE — Telephone Encounter (Signed)
Patient requesting pain medication for her leg pain, she was unable to keep her appt for today but I moved it to 6/20. Patients # [phone]

## 2017-08-12 NOTE — Telephone Encounter (Signed)
Called pt and advised that we can not give rx for pain medication but she can discuss with Dr. Sharol Given at her appt and see what the best next steps for her will be.

## 2017-08-21 ENCOUNTER — Telehealth (INDEPENDENT_AMBULATORY_CARE_PROVIDER_SITE_OTHER): Payer: Self-pay

## 2017-08-21 NOTE — Telephone Encounter (Signed)
Patient called triage line today requesting a refill on her pain med. Says she needs pain med so she can continue therapy. Adv her per last phone call note that Dr. Sharol Given will discuss this with her tomorrow and her f/u appt and that she needs to make sure she comes for this appt. She voiced understanding.

## 2017-08-22 ENCOUNTER — Encounter (INDEPENDENT_AMBULATORY_CARE_PROVIDER_SITE_OTHER): Payer: Self-pay | Admitting: Orthopedic Surgery

## 2017-08-22 ENCOUNTER — Ambulatory Visit (INDEPENDENT_AMBULATORY_CARE_PROVIDER_SITE_OTHER): Payer: Medicare HMO | Admitting: Orthopedic Surgery

## 2017-08-22 VITALS — Ht 62.0 in | Wt 143.0 lb

## 2017-08-22 DIAGNOSIS — M79605 Pain in left leg: Secondary | ICD-10-CM

## 2017-08-22 DIAGNOSIS — M79604 Pain in right leg: Secondary | ICD-10-CM

## 2017-08-22 MED ORDER — NABUMETONE 750 MG PO TABS: 750.0000 mg | ORAL_TABLET | Freq: Two times a day (BID) | ORAL | 3 refills | Status: AC | PRN

## 2017-08-22 NOTE — Progress Notes (Signed)
Office Visit Note   Patient: Jacqueline Reese           Date of Birth: February 26, 1956           MRN: [id number] Visit Date: 08/22/2017              Requested by: Nolene Ebbs, MD [address] [place], [place] [address] PCP: Nolene Ebbs, MD  Chief Complaint  Patient presents with  . Right Leg - Pain  . Left Leg - Pain      HPI: Patient is a 62 year old woman who presents with chronic bilateral leg pain she states occasionally has pain in the thigh calf ankle and legs.  She states is not present all the time she has a on and off she states that sometimes she screams out with leg pain.  She states that recently she fell at home about 2 weeks ago she could not participate with physical therapy due to her fall and requests reconsultation with home health physical therapy.  Assessment & Plan: Visit Diagnoses:  1. Bilateral leg pain     Plan: We will set her up with [facility] for home health physical therapy for strengthening.  We will call in a prescription for Relafen twice a day she will resume taking her Neurontin she states she does have this at home but has not been taking it.  She is completed a course of prednisone.  She states she is drinking coconut water and the importance of smoking cessation was discussed.  Follow-Up Instructions: Return if symptoms worsen or fail to improve.   Ortho Exam  Patient is alert, oriented, no adenopathy, well-dressed, normal affect, normal respiratory effort. Examination patient is ambulating in a wheelchair.  She has no pain with range of motion of the hip knee or ankle.  She has a negative straight leg raise bilaterally there is no focal motor weakness in either lower extremity.  Patient does not have a recent uric acid level level and we will need to get this repeated.  Imaging: No results found. No images are attached to the encounter.  Labs: Lab Results  Component Value Date   HGBA1C 8.4 01/19/2010   LABURIC 5.9 07/08/2009   LABURIC 9.9 (H) 06/06/2009   REPTSTATUS 06/04/2010 FINAL 06/02/2010   CULT  06/02/2010    GROUP B STREP(S.AGALACTIAE)ISOLATED Note: TESTING AGAINST S. AGALACTIAE NOT ROUTINELY PERFORMED DUE TO PREDICTABILITY OF AMP/PEN/VAN SUSCEPTIBILITY.     Lab Results  Component Value Date   ALBUMIN 3.0 (L) 09/16/2015   ALBUMIN 4.0 06/02/2010   ALBUMIN 4.6 01/19/2010   LABURIC 5.9 07/08/2009   LABURIC 9.9 (H) 06/06/2009    Body mass index is 26.16 kg/m.  Orders:  No orders of the defined types were placed in this encounter.  Meds ordered this encounter  Medications  . nabumetone (RELAFEN) 750 MG tablet    Sig: Take 1 tablet (750 mg total) by mouth 2 (two) times daily as needed for mild pain or moderate pain. with food    Dispense:  60 tablet    Refill:  3     Procedures: No procedures performed  Clinical Data: No additional findings.  ROS:  All other systems negative, except as noted in the HPI. Review of Systems  Objective: Vital Signs: Ht 5\' 2"  (1.575 m)   Wt 143 lb (64.9 kg)   LMP 05/09/2010   BMI 26.16 kg/m   Specialty Comments:  No specialty comments available.  PMFS History: Patient Active Problem List  Diagnosis Date Noted  . Angioedema 09/13/2015  . Anaphylaxis   . Hypertension, essential   . Acute hypoxemic respiratory failure ([place])   . Type 2 diabetes mellitus without complication, without long-term current use of insulin ([place])   . DIAB W/O MENTION COMP TYPE II/UNS TYPE UNCNTRL 01/19/2010  . FOOT ULCER, RIGHT 01/19/2010  . MENOPAUSE-RELATED VASOMOTOR SYMPTOMS, HOT FLASHES 10/04/2009  . GOUT, UNSPECIFIED 06/07/2009  . LICHEN SIMPLEX CHRONICUS 11/30/2008  . OTITIS EXTERNA, CHRONIC 04/16/2008  . OBESITY, NOS 05/02/2006  . SCHIZOPHRENIA 05/02/2006  . HYPERTENSION, BENIGN SYSTEMIC 05/02/2006  . GASTROESOPHAGEAL REFLUX, NO ESOPHAGITIS 05/02/2006   Past Medical History:  Diagnosis Date  . Arthritis   . Chronic back pain   . Depression   . Diabetes  mellitus without complication ([place])   . Gout   . Hypertension   . Schizophrenic disorder ([place])     History reviewed. No pertinent family history.  Past Surgical History:  Procedure Laterality Date  . BREAST BIOPSY Left    core biopsy  . CESAREAN SECTION     x2  . NASAL ENDOSCOPY Left 09/13/2015   Procedure: NASAL ENDOSCOPY WITH NASAL INTABATION;  Surgeon: Jerrell Belfast, MD;  Location: [facility] OR;  Service: ENT;  Laterality: Left;   Social History   Occupational History  . Not on file  Tobacco Use  . Smoking status: Current Every Day Smoker    Packs/day: 0.33    Types: Cigarettes  . Smokeless tobacco: Never Used  Substance and Sexual Activity  . Alcohol use: No  . Drug use: No  . Sexual activity: Not on file

## 2017-08-23 ENCOUNTER — Other Ambulatory Visit (INDEPENDENT_AMBULATORY_CARE_PROVIDER_SITE_OTHER): Payer: Self-pay

## 2017-08-23 ENCOUNTER — Telehealth (INDEPENDENT_AMBULATORY_CARE_PROVIDER_SITE_OTHER): Payer: Self-pay | Admitting: Orthopedic Surgery

## 2017-08-23 ENCOUNTER — Telehealth (INDEPENDENT_AMBULATORY_CARE_PROVIDER_SITE_OTHER): Payer: Self-pay

## 2017-08-23 DIAGNOSIS — M79604 Pain in right leg: Secondary | ICD-10-CM

## 2017-08-23 DIAGNOSIS — M79605 Pain in left leg: Principal | ICD-10-CM

## 2017-08-23 NOTE — Telephone Encounter (Signed)
Patient called asking for a refill on her pain medication, she states she's in a lot of pain. CB # [phone]

## 2017-08-23 NOTE — Telephone Encounter (Signed)
Patient called stated she forgot to mention yesterday at her appt with Dr Sharol Given that her legs are popping. I tried asking patient where they were popping in her knees and she stated that her legs were popping all over, in her thighs, knees, calfs and ankles when she tries to walk.  Contact for patient [phone]

## 2017-08-23 NOTE — Telephone Encounter (Signed)
Pt has been referred to physical therapy and has had rx for relafen  called to pharm.

## 2017-08-23 NOTE — Telephone Encounter (Signed)
Pt states that pharm does not have Relafen rx. I called this in for her to the [facility] on [address]

## 2017-08-26 ENCOUNTER — Telehealth (INDEPENDENT_AMBULATORY_CARE_PROVIDER_SITE_OTHER): Payer: Self-pay

## 2017-08-26 NOTE — Telephone Encounter (Signed)
Faxed over last office visit note.

## 2017-08-26 NOTE — Telephone Encounter (Signed)
Margaret Freight forwarder with [facility]. Referral received from Dr Sharol Given.   Sharol Given wants patient to have a one time uric acid drawn.   Has order but needs complete referral need a face to face documentation and H&P.  Will need this faxed  1. [phone]  2. [phone]   CB [phone]

## 2017-08-27 ENCOUNTER — Telehealth (INDEPENDENT_AMBULATORY_CARE_PROVIDER_SITE_OTHER): Payer: Self-pay

## 2017-08-27 NOTE — Telephone Encounter (Signed)
Patient called stating that she is having lots of swelling and wants something like a Fluid pills(FUROSEMIDE)20MG .  Stating feet are hurting and burning.  Please call patient to advise

## 2017-08-27 NOTE — Telephone Encounter (Signed)
[facility] called with uric acid results 7.7

## 2017-08-27 NOTE — Telephone Encounter (Signed)
Can you advise for [facility] patient

## 2017-08-27 NOTE — Telephone Encounter (Signed)
Sure #[phone].

## 2017-08-28 ENCOUNTER — Other Ambulatory Visit (INDEPENDENT_AMBULATORY_CARE_PROVIDER_SITE_OTHER): Payer: Self-pay

## 2017-08-28 MED ORDER — ALLOPURINOL 100 MG PO TABS: 100.0000 mg | ORAL_TABLET | Freq: Two times a day (BID) | ORAL | 3 refills | Status: DC

## 2017-08-28 MED ORDER — FUROSEMIDE 20 MG PO TABS: 20.0000 mg | ORAL_TABLET | Freq: Every day | ORAL | 0 refills | Status: DC

## 2017-08-28 NOTE — Telephone Encounter (Signed)
Call in prescription for allopurinol 100 mg twice a day.

## 2017-08-28 NOTE — Telephone Encounter (Signed)
Called pt and lm on vm to advise of lab result and new medication. This was faxed to [facility] on [address] and she is to call with any questions.

## 2017-08-28 NOTE — Telephone Encounter (Signed)
Uric acid 7.7. Pt is not currently taking gout medication. She has a history of allopurinol rx in her chart from 2013

## 2017-08-28 NOTE — Progress Notes (Signed)
allopurinol

## 2017-08-28 NOTE — Telephone Encounter (Signed)
Called patient to advise Rx for (lasix) was sent to pharm okay per Dr Erlinda Hong. States she found some fluid pills already and would like something for pain instead.   Also she states she is taking so much medicine (reg meds) and would like to know if she should be taking everything or not. I advised her she should contact her PCP but she would like for Dr Sharol Given to review her meds and tell her if she should or not. Advised her he is out of the office.

## 2017-08-29 ENCOUNTER — Telehealth (INDEPENDENT_AMBULATORY_CARE_PROVIDER_SITE_OTHER): Payer: Self-pay | Admitting: Orthopedic Surgery

## 2017-08-29 NOTE — Telephone Encounter (Signed)
Patient called wanting to know where is the location of her gout. Patient said the therapist wanted to know. The number to contact patient is [phone]

## 2017-08-30 ENCOUNTER — Telehealth (INDEPENDENT_AMBULATORY_CARE_PROVIDER_SITE_OTHER): Payer: Self-pay | Admitting: Radiology

## 2017-08-30 NOTE — Telephone Encounter (Signed)
Called and pt and advised that she can take the Relafen that Dr. Sharol Given had given. She should be taking her gout medication. Her uric acid level was elevated and Dr. Sharol Given said that this could be the pain that she is having in her LE.

## 2017-08-30 NOTE — Telephone Encounter (Signed)
Patient called stating her legging is popping and that she wanted to see if Dr. Sharol Given would call in a cream to put on it.  She said it hurts when it pops.

## 2017-08-30 NOTE — Telephone Encounter (Signed)
Duplicate message called and sw pt.

## 2017-09-02 ENCOUNTER — Telehealth (INDEPENDENT_AMBULATORY_CARE_PROVIDER_SITE_OTHER): Payer: Self-pay

## 2017-09-02 NOTE — Telephone Encounter (Signed)
I called and advised pt that we have had numerous conversations about medication and that Dr. Sharol Given wants to see how the gout medication works for her in addition to the anti inflammatory, physical therapy and coconut water. She needs to give it time to see if it will work we will redraw blood work in a few weeks and see if the uric acid number is coming down and can adjust medication based on this.

## 2017-09-02 NOTE — Telephone Encounter (Signed)
Patient called and states she is still having so much pain in Bil  Knee/legs and is taking Relafen and does not help. Would like to see if there is anything else she can get.    CB [phone]

## 2017-09-06 ENCOUNTER — Telehealth (INDEPENDENT_AMBULATORY_CARE_PROVIDER_SITE_OTHER): Payer: Self-pay

## 2017-09-06 NOTE — Telephone Encounter (Signed)
Triage call:   Patient calls complaining of pain in her buttocks, knees and back of thighs, stating it hurts to sit.  She is taking Nabumetone 750 mg 1 bid, Allopurinol 100 mg 1 bid and Gabapentin 300 mg 2 tid. What else can she do? Please advise.

## 2017-09-06 NOTE — Telephone Encounter (Signed)
Advised patient there is nothing more we can do for her pain and she needs to be in pain management at his point.  She says she wants to think on that and let us know if she wants [place] to proceed with the referral.

## 2017-09-06 NOTE — Telephone Encounter (Signed)
She needs to be in pain managment

## 2017-09-16 ENCOUNTER — Other Ambulatory Visit (INDEPENDENT_AMBULATORY_CARE_PROVIDER_SITE_OTHER): Payer: Self-pay

## 2017-09-16 ENCOUNTER — Telehealth (INDEPENDENT_AMBULATORY_CARE_PROVIDER_SITE_OTHER): Payer: Self-pay

## 2017-09-16 DIAGNOSIS — M79604 Pain in right leg: Secondary | ICD-10-CM

## 2017-09-16 DIAGNOSIS — E1142 Type 2 diabetes mellitus with diabetic polyneuropathy: Secondary | ICD-10-CM

## 2017-09-16 DIAGNOSIS — M79605 Pain in left leg: Principal | ICD-10-CM

## 2017-09-16 NOTE — Telephone Encounter (Signed)
Order has been placed in chart the pt is requesting a Dr. Primus Bravo I googled it and looks like he is in [place]? The pt states that if we fax over her information today she can get in this week? I am not sure but she did give a fax number for the office [phone] can you call her and let her know when the information has been sent? I appreciate it thanks!!

## 2017-09-16 NOTE — Telephone Encounter (Signed)
Pt called back and states now that Dr. Primus Bravo does not take [place] insurance and need to find another pain management clinic. I am sorry.

## 2017-09-16 NOTE — Telephone Encounter (Signed)
Sent pain referral to [facility], pending appt

## 2017-09-16 NOTE — Telephone Encounter (Signed)
Pt called and lm on vm for triage line to advise that she is needing her medical history and med list faxed to a Dr. Claudia Desanctis (sp?) in [place]. Fax number given [phone] pt states that this needs to be done today as she states that they can get her in this week.

## 2017-09-16 NOTE — Telephone Encounter (Signed)
Call pt for pain management referral

## 2017-10-01 ENCOUNTER — Telehealth (INDEPENDENT_AMBULATORY_CARE_PROVIDER_SITE_OTHER): Payer: Self-pay | Admitting: Orthopedic Surgery

## 2017-10-01 NOTE — Telephone Encounter (Signed)
Pt was last seen in office 08/22/17. She had completed course of steroid at that time. Pt has been given rx for muscle spasm and Neurontin and has been referred to pain management clinic. Is now asking for refill on prednisone. Please advise.

## 2017-10-01 NOTE — Telephone Encounter (Signed)
Ok refill prednisone

## 2017-10-01 NOTE — Telephone Encounter (Signed)
Pt Request  Prednisone

## 2017-10-02 ENCOUNTER — Other Ambulatory Visit (INDEPENDENT_AMBULATORY_CARE_PROVIDER_SITE_OTHER): Payer: Self-pay

## 2017-10-02 ENCOUNTER — Other Ambulatory Visit (INDEPENDENT_AMBULATORY_CARE_PROVIDER_SITE_OTHER): Payer: Self-pay | Admitting: Orthopaedic Surgery

## 2017-10-02 MED ORDER — PREDNISONE 10 MG PO TABS: 20.0000 mg | ORAL_TABLET | Freq: Every day | ORAL | 0 refills | Status: DC

## 2017-10-02 NOTE — Telephone Encounter (Signed)
Called pt to advise that rx has been faxed to pharm.  

## 2017-10-02 NOTE — Telephone Encounter (Signed)
Patient called in ask for prednisone refill, her leg is hurting.  She also asks if Dr Sharol Given can refill her furosemide 20 mg for her, as Dr Jeanie Cooks is out of the country.  Pls call her.

## 2017-10-08 NOTE — Telephone Encounter (Signed)
[facility] [facility] in pharmacy called regarding the patient's Prednisone prescription.  This was faxed into the [facility] pharmacy, but it actually needs to go to the [facility] in Pharmacy.

## 2017-10-11 ENCOUNTER — Other Ambulatory Visit (INDEPENDENT_AMBULATORY_CARE_PROVIDER_SITE_OTHER): Payer: Self-pay | Admitting: Orthopedic Surgery

## 2017-10-11 NOTE — Telephone Encounter (Signed)
Dr. Sharol Given completed. Will fax today.

## 2017-10-17 ENCOUNTER — Other Ambulatory Visit (INDEPENDENT_AMBULATORY_CARE_PROVIDER_SITE_OTHER): Payer: Self-pay | Admitting: Radiology

## 2017-10-17 ENCOUNTER — Other Ambulatory Visit (INDEPENDENT_AMBULATORY_CARE_PROVIDER_SITE_OTHER): Payer: Self-pay | Admitting: Orthopedic Surgery

## 2017-10-17 MED ORDER — METHOCARBAMOL 500 MG PO TABS: 500.0000 mg | ORAL_TABLET | Freq: Three times a day (TID) | ORAL | 0 refills | Status: DC | PRN

## 2017-10-17 NOTE — Telephone Encounter (Signed)
Patient called and said she already has prednisone but she needs something like a muscle relaxer and something for pain. She wants it called into [facility]. Please advise # [phone]

## 2017-10-17 NOTE — Telephone Encounter (Signed)
Rx request 

## 2017-10-17 NOTE — Telephone Encounter (Signed)
Rx sent in, ok per Sharol Given

## 2017-10-17 NOTE — Addendum Note (Signed)
Addended by: Brand Males E on: 10/17/2017 01:03 PM   Modules accepted: Orders

## 2017-10-17 NOTE — Telephone Encounter (Signed)
Patient requesting muscle relaxer. Pls advise.

## 2017-10-18 ENCOUNTER — Telehealth (INDEPENDENT_AMBULATORY_CARE_PROVIDER_SITE_OTHER): Payer: Self-pay | Admitting: Orthopedic Surgery

## 2017-10-18 NOTE — Telephone Encounter (Signed)
I will hold this message for Dr. Sharol Given to discuss on Monday. Multiple medications and requests for different medications and then states that the medications that have been requested do not work. Will discuss on what next steps are for the pt on monday

## 2017-10-18 NOTE — Telephone Encounter (Signed)
Patient called advised she can not take the Methocarbamol. Patient asked if she can be prescribed something else. The number to contact patient is [phone]

## 2017-10-21 ENCOUNTER — Telehealth (INDEPENDENT_AMBULATORY_CARE_PROVIDER_SITE_OTHER): Payer: Self-pay

## 2017-10-21 NOTE — Telephone Encounter (Signed)
Patient called the triage phone this morning states she is having Leg Cramps and would like Rx for cramps-muscle spasms.   CB [phone]

## 2017-10-21 NOTE — Telephone Encounter (Signed)
Pt will discuss medications with Dr. Sharol Given at appt.

## 2017-10-21 NOTE — Telephone Encounter (Signed)
Mediation Refill Patient request   Cholesterol    Called patient to advise patient of message below, patient stated she is currently  unable to walk. Scheduled patient on the 27th of August.

## 2017-10-21 NOTE — Telephone Encounter (Signed)
Can you please call pt and make an appt for sometime this week with Dr. Sharol Given? We need to discuss medication and treatment and this requires a face to face encounter.

## 2017-10-21 NOTE — Telephone Encounter (Signed)
Sorry ment to send it to you.

## 2017-10-21 NOTE — Telephone Encounter (Signed)
Going to have pt come in the office for an office eval to discuss medication.

## 2017-10-24 ENCOUNTER — Encounter (HOSPITAL_COMMUNITY): Payer: Self-pay | Admitting: Emergency Medicine

## 2017-10-24 ENCOUNTER — Emergency Department (HOSPITAL_COMMUNITY)
Admission: EM | Admit: 2017-10-24 | Discharge: 2017-10-24 | Disposition: A | Discharge status: Home / Self Care | Payer: Medicare HMO | Attending: Emergency Medicine | Admitting: Emergency Medicine

## 2017-10-24 ENCOUNTER — Emergency Department (HOSPITAL_COMMUNITY): Payer: Medicare HMO

## 2017-10-24 DIAGNOSIS — D72829 Elevated white blood cell count, unspecified: Secondary | ICD-10-CM | POA: Insufficient documentation

## 2017-10-24 DIAGNOSIS — F17210 Nicotine dependence, cigarettes, uncomplicated: Secondary | ICD-10-CM | POA: Diagnosis not present

## 2017-10-24 DIAGNOSIS — Z79899 Other long term (current) drug therapy: Secondary | ICD-10-CM | POA: Insufficient documentation

## 2017-10-24 DIAGNOSIS — E119 Type 2 diabetes mellitus without complications: Secondary | ICD-10-CM | POA: Diagnosis not present

## 2017-10-24 DIAGNOSIS — R531 Weakness: Secondary | ICD-10-CM | POA: Diagnosis not present

## 2017-10-24 DIAGNOSIS — E876 Hypokalemia: Secondary | ICD-10-CM | POA: Insufficient documentation

## 2017-10-24 DIAGNOSIS — Z7982 Long term (current) use of aspirin: Secondary | ICD-10-CM | POA: Diagnosis not present

## 2017-10-24 DIAGNOSIS — R29898 Other symptoms and signs involving the musculoskeletal system: Secondary | ICD-10-CM

## 2017-10-24 DIAGNOSIS — I10 Essential (primary) hypertension: Secondary | ICD-10-CM | POA: Insufficient documentation

## 2017-10-24 DIAGNOSIS — Z7984 Long term (current) use of oral hypoglycemic drugs: Secondary | ICD-10-CM | POA: Insufficient documentation

## 2017-10-24 LAB — CBC WITH DIFFERENTIAL/PLATELET
Abs Immature Granulocytes: 0.1 10*3/uL (ref 0.0–0.1)
BASOS PCT: 0 %
Basophils Absolute: 0 10*3/uL (ref 0.0–0.1)
EOS ABS: 0 10*3/uL (ref 0.0–0.7)
EOS PCT: 0 %
HCT: 38.6 % (ref 36.0–46.0)
Hemoglobin: 12.3 g/dL (ref 12.0–15.0)
Immature Granulocytes: 1 %
LYMPHS ABS: 0.8 10*3/uL (ref 0.7–4.0)
Lymphocytes Relative: 6 %
MCH: 30 pg (ref 26.0–34.0)
MCHC: 31.9 g/dL (ref 30.0–36.0)
MCV: 94.1 fL (ref 78.0–100.0)
Monocytes Absolute: 0.4 10*3/uL (ref 0.1–1.0)
Monocytes Relative: 3 %
Neutro Abs: 12.1 10*3/uL — ABNORMAL HIGH (ref 1.7–7.7)
Neutrophils Relative %: 90 %
PLATELETS: 394 10*3/uL (ref 150–400)
RBC: 4.1 MIL/uL (ref 3.87–5.11)
RDW: 16.9 % — AB (ref 11.5–15.5)
WBC: 13.4 10*3/uL — AB (ref 4.0–10.5)

## 2017-10-24 LAB — COMPREHENSIVE METABOLIC PANEL
ALK PHOS: 47 U/L (ref 38–126)
ALT: 26 U/L (ref 0–44)
AST: 27 U/L (ref 15–41)
Albumin: 3.8 g/dL (ref 3.5–5.0)
Anion gap: 11 (ref 5–15)
BUN: 9 mg/dL (ref 8–23)
CALCIUM: 9.2 mg/dL (ref 8.9–10.3)
CHLORIDE: 106 mmol/L (ref 98–111)
CO2: 24 mmol/L (ref 22–32)
CREATININE: 0.61 mg/dL (ref 0.44–1.00)
GFR calc non Af Amer: >60 mL/min (ref >60)
Glucose, Bld: 130 mg/dL — ABNORMAL HIGH (ref 70–99)
Potassium: 3 mmol/L — ABNORMAL LOW (ref 3.5–5.1)
Sodium: 141 mmol/L (ref 135–145)
Total Bilirubin: 0.5 mg/dL (ref 0.3–1.2)
Total Protein: 6.4 g/dL — ABNORMAL LOW (ref 6.5–8.1)

## 2017-10-24 LAB — URINALYSIS, ROUTINE W REFLEX MICROSCOPIC
BILIRUBIN URINE: NEGATIVE
GLUCOSE, UA: NEGATIVE mg/dL
HGB URINE DIPSTICK: NEGATIVE
Ketones, ur: 5 mg/dL — AB
Leukocytes, UA: NEGATIVE
Nitrite: NEGATIVE
Protein, ur: NEGATIVE mg/dL
SPECIFIC GRAVITY, URINE: 1.028 (ref 1.005–1.030)
pH: 5 (ref 5.0–8.0)

## 2017-10-24 MED ORDER — POTASSIUM CHLORIDE CRYS ER 20 MEQ PO TBCR
40.0000 meq | EXTENDED_RELEASE_TABLET | Freq: Once | ORAL | Status: AC
  Administered 2017-10-24: 40 meq via ORAL
  Filled 2017-10-24: qty 2

## 2017-10-24 MED ORDER — POTASSIUM CHLORIDE CRYS ER 20 MEQ PO TBCR: 20.0000 meq | EXTENDED_RELEASE_TABLET | Freq: Two times a day (BID) | ORAL | 0 refills | Status: DC

## 2017-10-24 MED ORDER — FUROSEMIDE 20 MG PO TABS: 20.0000 mg | ORAL_TABLET | Freq: Every day | ORAL | 0 refills | Status: DC

## 2017-10-24 NOTE — ED Provider Notes (Signed)
Patient placed in Quick Look pathway, seen and evaluated   Chief Complaint: Facial swelling.  HPI:   Patient states she has been out of her Lasix for the past several days.  She has increasing facial swelling, which he thinks has worsened since she has been in the ER.  She states swelling is around her eyes.  She denies pain or itchiness.  She denies throat, lip, or tongue swelling.  She is having no difficulty breathing.  She denies chest pain or shortness of breath. Patient's family believes that she is slurring her words.  Patient denies this.  ROS: facial swelling  Physical Exam:   Gen: No distress  Neuro: Awake and Alert.  No obvious slurred speech.  CN intact.  Patient with focal weakness of the left wrist without weakness of the left forearm, biceps, or shoulder.  No numbness or tingling.  Good radial pulses.  No tenderness of the left arm.  Negative pronator drift.  Skin: Warm  HEENT: Minimal periorbital edema without signs of airway involvement.  Speaking in full sentences.  Airway patent.  No lip/mouth/tongue swelling.  Discussed with attending, Dr. Laverta Baltimore agrees to plan.  Will obtain head CT, labs in process.  No code stroke called.  Initiation of care has begun. The patient has been counseled on the process, plan, and necessity for staying for the completion/evaluation, and the remainder of the medical screening examination    Franchot Heidelberg, PA-C 10/24/17 1402    Long, Wonda [facility], MD 10/24/17 2017

## 2017-10-24 NOTE — ED Provider Notes (Signed)
[facility] EMERGENCY DEPARTMENT Provider Note   CSN: [id number] Arrival date & time: 10/24/17  1101     History   Chief Complaint Chief Complaint  Patient presents with  . Extremity Weakness    HPI Jacqueline Reese is a 62 y.o. female.  HPI  Patient is a 62 year old female with a history of type 2 diabetes mellitus, hypertension, depression presenting for left hand weakness.  Patient reports that she woke up this morning with it feeling weak and inability to extend her wrist.  Patient reports that she had her wrist took under her chin last night while sleeping.  She states that she does not have any numbness, and is able to use all fingers of her hand and grip, but just cannot lift the hand.  Patient denies any weakness of the remainder of the left upper extremity.  Patient denies any headaches, visual disturbance, changes in her mental status, weakness or numbness in any part of her body.  Patient does report that she is a walker at baseline due to pain that makes her legs weak, but this is not new.  Patient denies any chest pain, shortness of breath, cough, fever or chills.  No history of sitting with her left arm propped on the back of a chair.  Past Medical History:  Diagnosis Date  . Arthritis   . Chronic back pain   . Depression   . Diabetes mellitus without complication ([place])   . Gout   . Hypertension   . Schizophrenic disorder [facility])     Patient Active Problem List   Diagnosis Date Noted  . Angioedema 09/13/2015  . Anaphylaxis   . Hypertension, essential   . Acute hypoxemic respiratory failure ([place])   . Type 2 diabetes mellitus without complication, without long-term current use of insulin ([place])   . DIAB W/O MENTION COMP TYPE II/UNS TYPE UNCNTRL 01/19/2010  . FOOT ULCER, RIGHT 01/19/2010  . MENOPAUSE-RELATED VASOMOTOR SYMPTOMS, HOT FLASHES 10/04/2009  . GOUT, UNSPECIFIED 06/07/2009  . LICHEN SIMPLEX CHRONICUS 11/30/2008  . OTITIS EXTERNA,  CHRONIC 04/16/2008  . OBESITY, NOS 05/02/2006  . SCHIZOPHRENIA 05/02/2006  . HYPERTENSION, BENIGN SYSTEMIC 05/02/2006  . GASTROESOPHAGEAL REFLUX, NO ESOPHAGITIS 05/02/2006    Past Surgical History:  Procedure Laterality Date  . BREAST BIOPSY Left    core biopsy  . CESAREAN SECTION     x2  . NASAL ENDOSCOPY Left 09/13/2015   Procedure: NASAL ENDOSCOPY WITH NASAL INTABATION;  Surgeon: Jerrell Belfast, MD;  Location: [facility];  Service: ENT;  Laterality: Left;     OB History   None      Home Medications    Prior to Admission medications   Medication Sig Start Date End Date Taking? Authorizing Provider  acetaminophen (TYLENOL) 500 MG tablet Take 1,500 mg by mouth every 6 (six) hours as needed for mild pain.   Yes [provider]  allopurinol (ZYLOPRIM) 100 MG tablet Take 1 tablet (100 mg total) by mouth 2 (two) times daily. 08/28/17  Yes Newt Minion, MD  amLODipine (NORVASC) 5 MG tablet Take 1 tablet (5 mg total) by mouth daily. 09/18/15  Yes Whiteheart, Cristal Ford, NP  aspirin EC 325 MG tablet Take 325 mg by mouth daily.   Yes [provider]  clonazePAM (KLONOPIN) 1 MG tablet Take 1 mg by mouth 3 (three) times daily.    Yes [provider]  Diclofenac Sodium 3 % GEL Place 1 application onto the skin 2 (two) times  daily. To affected area. Patient taking differently: Place 1 application onto the skin 4 (four) times daily. To affected area. 02/05/16  Yes Waynetta Pean, PA-C  furosemide (LASIX) 20 MG tablet Take 1 tablet (20 mg total) by mouth daily. 08/28/17  Yes Leandrew Koyanagi, MD  gabapentin (NEURONTIN) 300 MG capsule Take 2 capsules (600 mg total) by mouth 3 (three) times daily. 06/03/17  Yes Newt Minion, MD  glipiZIDE (GLUCOTROL XL) 2.5 MG 24 hr tablet Take 2.5 mg by mouth daily.   Yes [provider]  HYDROcodone-acetaminophen (NORCO) 10-325 MG tablet Take 1 tablet by mouth every 6 (six) hours as needed for pain. 10/16/17  Yes [provider]  LINZESS 145 MCG CAPS capsule Take 145 mcg by mouth every 14 (fourteen) days. 09/14/17  Yes [provider]  meloxicam (MOBIC) 7.5 MG tablet Take 1 tablet (7.5 mg total) by mouth daily. 12/23/16  Yes Burky, Lanelle Bal B, NP  metFORMIN (GLUCOPHAGE) 500 MG tablet Take 500 mg by mouth 2 (two) times daily with a meal.   Yes [provider]  nabumetone (RELAFEN) 750 MG tablet Take 1 tablet (750 mg total) by mouth 2 (two) times daily as needed for moderate pain. Patient taking differently: Take 750 mg by mouth daily.  05/03/17  Yes Newt Minion, MD  predniSONE (DELTASONE) 10 MG tablet TAKE 2 TABLETS(20 MG) BY MOUTH DAILY WITH BREAKFAST Patient taking differently: Take 20 mg by mouth daily with breakfast.  10/17/17  Yes Newt Minion, MD  predniSONE (DELTASONE) 20 MG tablet Take 2 tablets daily with breakfast. 04/07/17  Yes Jaynee Eagles, PA-C  spironolactone (ALDACTONE) 50 MG tablet Take 50 mg by mouth daily.   Yes [provider]  ziprasidone (GEODON) 80 MG capsule Take 160 mg by mouth every evening.  07/13/15  Yes [provider]  albuterol (PROVENTIL HFA;VENTOLIN HFA) 108 (90 BASE) MCG/ACT inhaler Inhale 2 puffs into the lungs every 4 (four) hours as needed for wheezing or shortness of breath. Patient not taking: Reported on 10/24/2017 05/09/13   Janne Napoleon, NP  diphenhydrAMINE (BENADRYL) 25 mg capsule Take 1 capsule (25 mg total) by mouth 2 (two) times daily. Patient not taking: Reported on 10/24/2017 09/18/15 09/21/15  Marijean Heath, NP  famotidine (PEPCID) 40 MG tablet Take 1 tablet (40 mg total) by mouth daily. Patient not taking: Reported on 10/24/2017 09/18/15   Darlina Sicilian A, NP  lidocaine (LIDODERM) 5 % Place 1 patch onto the skin daily. Remove & Discard patch within 12 hours or as directed by MD Patient not taking: Reported on 10/24/2017 06/17/17   Petrucelli, Glynda Jaeger, PA-C  methocarbamol (ROBAXIN) 500 MG tablet Take 1 tablet (500 mg total) by mouth  every 8 (eight) hours as needed. Patient not taking: Reported on 10/24/2017 10/17/17   Newt Minion, MD  naproxen (NAPROSYN) 500 MG tablet TAKE 1 TABLET TWO TIMES DAILY BY MOUTH AS NEEDED FOR MILD PAIN OR MODERATE PAIN Patient not taking: Reported on 10/24/2017 01/23/17   Newt Minion, MD  traMADol (ULTRAM) 50 MG tablet Take 1 tablet (50 mg total) by mouth every 6 (six) hours as needed. Patient not taking: Reported on 10/24/2017 07/27/17   Dossie Der    Family History History reviewed. No pertinent family history.  Social History Social History   Tobacco Use  . Smoking status: Current Every Day Smoker    Packs/day: 0.33    Types: Cigarettes  . Smokeless tobacco: Never Used  Substance Use Topics  . Alcohol use: No  . Drug use: No     Allergies   Lisinopril; Chantix [varenicline]; Doxycycline; Ibuprofen; Lamisil [terbinafine]; Cephalexin; and Sulfa antibiotics   Review of Systems Review of Systems  Constitutional: Negative for chills and fever.  HENT: Negative for congestion and sore throat.   Eyes: Negative for visual disturbance.  Respiratory: Negative for cough, chest tightness and shortness of breath.   Cardiovascular: Negative for chest pain, palpitations and leg swelling.  Gastrointestinal: Negative for abdominal pain, nausea and vomiting.  Genitourinary: Negative for dysuria and flank pain.  Musculoskeletal: Negative for back pain and myalgias.  Skin: Negative for rash.  Neurological: Positive for weakness. Negative for dizziness, syncope, light-headedness and headaches.       +Left hand weakness.     Physical Exam Updated Vital Signs BP 120/71   Pulse 69   Temp 98.8 F (37.1 C) (Oral)   Resp 18   LMP 05/09/2010   SpO2 97%   Physical Exam  Constitutional: She appears well-developed and well-nourished. No distress.  Pleasant female sitting comfortably on edge of the bed.  HENT:  Head: Normocephalic and atraumatic.  Mouth/Throat: Oropharynx  is clear and moist.  Eyes: Pupils are equal, round, and reactive to light. Conjunctivae and EOM are normal.  Neck: Normal range of motion. Neck supple.  Cardiovascular: Normal rate, regular rhythm, S1 normal and S2 normal.  No murmur heard. Mild, 1+ pitting edema bilateral lower extremities.  Pulmonary/Chest: Effort normal and breath sounds normal. She has no wheezes. She has no rales.  Abdominal: Soft. She exhibits no distension. There is no tenderness. There is no guarding.  Musculoskeletal: Normal range of motion. She exhibits no edema or deformity.  Left Hand Exam:  Inspection: No swelling, deformity, discoloration, or wound.  Patient holds hand and passive flexion. Incision scar present over dorsum of wrist. Palpation: No point tenderness, crepitus, tenderness over anatomic snuffbox, or scapholunate joint tenderness ROM: Passive ROM intact at wrist, MCP, PIP, and DIP joints, thumb MCP and IP joints, and no rotational deformity of metacarpals noted. Ligamentous stability: No laxity to valgus/varus stress of MCP, PIP, or DIP joints. No joint laxity with radial stress of thumb. Flexor/Extensor tendons: FDS/FDP tendons intact in digits 2-5 at PIP/DIP joints, respectively; extensor tendons intact in all digits Nerve testing:  -Radial: Wrist extension not intact. Patient appears to be unable to extend wrist. Sensation to light touch intact at dorsal [facility] joint. -Median: Thumb opposition intact and 5/5 strength with resistance. Sensation to light touch intact on palmar side of 1st and 2nd digits. -Ulnar: Thumb adduction intact and 5/5 strength with resistance. Sensation to light touch intact on palmar side of 5th digit. Froment sign: Negative. Vascular: 2+ radial and ulnar pulses. Capillary refill <2 seconds b/l.  Neurological: She is alert.  Mental Status:  Alert, oriented, thought content appropriate, able to give a coherent history. Speech fluent without evidence of aphasia. Able to follow 2  step commands without difficulty.  Cranial Nerves:  II:  Peripheral visual fields grossly normal, pupils equal, round, reactive to light III,IV, VI: ptosis not present, extra-ocular motions intact bilaterally  V,VII: smile symmetric, facial light touch sensation equal VIII: hearing grossly normal to voice  X: uvula elevates symmetrically  XI: bilateral shoulder shrug symmetric and strong XII: midline tongue extension without fassiculations Motor:  Normal tone. 5/5 in upper and lower extremities bilaterally including strong and equal grip strength and dorsiflexion/plantar flexion. See hand exam above for radial nerve abnormalities.  Sensory: Pinprick and light touch normal in all extremities.  Deep Tendon Reflexes: 2+ and symmetric in the biceps and patella. Cerebellar: normal finger-to-nose with bilateral upper extremities Gait: normal gait with assistance of walker Stance: Romberg negative. No pronator drift and good coordination, strength, and position sense with tapping of bilateral arms (performed in sitting position).   Skin: Skin is warm and dry. No rash noted. No erythema.  Psychiatric: She has a normal mood and affect. Her behavior is normal. Judgment and thought content normal.  Nursing note and vitals reviewed.    ED Treatments / Results  Labs (all labs ordered are listed, but only abnormal results are displayed) Labs Reviewed  COMPREHENSIVE METABOLIC PANEL - Abnormal; Notable for the following components:      Result Value   Potassium 3.0 (*)    Glucose, Bld 130 (*)    Total Protein 6.4 (*)    All other components within normal limits  CBC WITH DIFFERENTIAL/PLATELET - Abnormal; Notable for the following components:   WBC 13.4 (*)    RDW 16.9 (*)    Neutro Abs 12.1 (*)    All other components within normal limits  URINALYSIS, ROUTINE W REFLEX MICROSCOPIC    EKG EKG Interpretation  Date/Time:  Thursday October 24 2017 11:49:02 EDT Ventricular Rate:  85 PR  Interval:  148 QRS Duration: 62 QT Interval:  256 QTC Calculation: 304 R Axis:   -8 Text Interpretation:  Normal sinus rhythm Low voltage QRS Nonspecific T wave abnormality New since previous tracing Abnormal ECG Confirmed by Kirk Ruths [phone]) on 10/24/2017 3:16:59 PM   Radiology Dg Chest 2 View  Result Date: 10/24/2017 CLINICAL DATA:  Leg swelling, has not taken Lasix in 2 days, history hypertension, diabetes mellitus, smoker EXAM: CHEST - 2 VIEW COMPARISON:  09/16/2015 FINDINGS: Normal heart size, mediastinal contours, and pulmonary vascularity. Minimal subsegmental atelectasis versus scarring at bases. Lungs otherwise clear. No infiltrate, pleural effusion or pneumothorax. Endplate spur formation lower thoracic spine. IMPRESSION: Minimal bibasilar atelectasis versus scarring. No acute abnormalities. Electronically Signed   By: Lavonia Dana M.D.   On: 10/24/2017 12:40   Ct Head Wo Contrast  Result Date: 10/24/2017 CLINICAL DATA:  LEFT arm weakness starting this morning. Feet and facial swelling. Off Lasix for several days. EXAM: CT HEAD WITHOUT CONTRAST TECHNIQUE: Contiguous axial images were obtained from the base of the skull through the vertex without intravenous contrast. COMPARISON:  Head CT dated 08/15/2007 FINDINGS: Brain: Ventricles are within normal limits in size and configuration. There is no mass, hemorrhage, edema or other evidence of acute parenchymal abnormality. No extra-axial hemorrhage. Vascular: Chronic calcified atherosclerotic changes of the large vessels at the skull base. No unexpected hyperdense vessel. Skull: Normal. Negative for fracture or focal lesion. Sinuses/Orbits: No acute finding. Other: None. IMPRESSION: No acute findings.  No intracranial mass, hemorrhage or edema. Electronically Signed   By: Franki Cabot M.D.   On: 10/24/2017 15:56    Procedures Procedures (including critical care time)  Medications Ordered in ED Medications  potassium chloride SA  (K-DUR,KLOR-CON) CR tablet 40 mEq (40 mEq Oral Given 10/24/17 1724)     Initial Impression / Assessment and Plan / ED Course  I have reviewed the triage vital signs and the nursing notes.  Pertinent labs & imaging results that were available during my care of the patient were reviewed by me and considered in my medical decision making (see chart for details).     Patient is nontoxic-appearing and neurologically intact with  the exception of the extensor mechanism of the left wrist, consistent with radial nerve distribution.  Doubtful of central cause of symptoms as patient has a very isolated peripheral nerve abnormality, as well as no sensory deficit.  Remainder of patient's neurologic exam as per her baseline.  Patient has normal noncontrasted head CT and normal EKG.  Chest x-ray is without acute abnormality.  Patient does have leukocytosis, which appears chronic over time.  Will defer to outpatient management.  Additionally, patient exhibits hypokalemia, consistent with Lasix use.  Patient has been out of Lasix, but will refill and ensure that patient has potassium supplementation.  Amatory referral to neurology placed.  Patient to return precautions for any progressive weakness or numbness in her extremities.  Patient is in understanding and agrees with the plan of care.  This is a shared visit with Dr. Dene Gentry. Patient was independently evaluated by this attending physician. Attending physician consulted in evaluation and discharge management.  Final Clinical Impressions(s) / ED Diagnoses   Final diagnoses:  Left hand weakness  Hypokalemia  Leukocytosis, unspecified type    ED Discharge Orders         Ordered    Ambulatory referral to Neurology    Comments:  An appointment is requested in approximately: 2 weeks. Possible peripheral nerve palsy.   10/24/17 1858    furosemide (LASIX) 20 MG tablet  Daily     10/24/17 1912    potassium chloride SA (K-DUR,KLOR-CON) 20 MEQ tablet   2 times daily     10/24/17 1912           Tamala Julian 10/24/17 1924    Valarie Merino, MD 10/25/17 [phone]

## 2017-10-24 NOTE — ED Notes (Addendum)
Pt called for registration and x-ray. Pt did not respond to either call.

## 2017-10-24 NOTE — ED Triage Notes (Addendum)
0730 LSN then L hand contracted- no other deficits. Eval by Dr. Laverta Baltimore at [phone] and cleared for code stroke rule out. Has not taken lasix in 2 days. Legs moderately swollen. A&O x 4. Lives at home but nonambulatory. Uses walked to sit on and get around. Sats in 88-91 when EMS arrived. Placed on 2 L [place]. CBG 135. No signs of any distress.

## 2017-10-24 NOTE — Discharge Instructions (Addendum)
Please see the information and instructions below regarding your visit.  Your diagnoses today include:  1. Left hand weakness   2. Hypokalemia   3. Leukocytosis, unspecified type     Tests performed today include: See side panel of your discharge paperwork for testing performed today. Vital signs are listed at the bottom of these instructions.   Your work-up is very reassuring today.  We did see that your potassium is low today and your white blood cell count is high.  We would like you to follow-up in 1 week for recheck.  Medications prescribed:    Take any prescribed medications only as prescribed, and any over the counter medications only as directed on the packaging.  On testing today, your potassium level was 3.0 which is slightly below normal. I suspect this is due to medications. Increasing potassium in your diet should be sufficient to make sure you are getting enough potassium. Below is a list of good sources and servings high in potassium (in order from greatest to least):  -1 cup cooked acorn squash -1 baked potato with skin -1 cup cooked spinach -1 cup cooked lentils -1 cup cooked kidney beans -Watermelon - cup raisins -1 cup plain yogurt -1 cup orange juice, frozen -Banana -1 cup 1% low-fat milk -1 cup cooked broccoli   Home care instructions:  Please follow any educational materials contained in this packet.   Please wear this wrist splint, especially at night to prevent your wrist from bending during sleep.  Follow-up instructions: Please follow-up with your primary care provider in one week for further evaluation of your symptoms if they are not completely improved.   Please follow up with neurology as soon as possible for possible inflammation in the nerve of your wrist.  Return instructions:  Please return to the Emergency Department if you experience worsening symptoms.  Please return to the emergency department immediately if you develop any further  weakness or numbness, have difficulty walking, or moving one side or the other, or if you have vision changes. Please return if you have any other emergent concerns.  Additional Information:   Your vital signs today were: BP 120/71    Pulse 69    Temp 98.8 F (37.1 C) (Oral)    Resp 18    LMP 05/09/2010    SpO2 97%  If your blood pressure (BP) was elevated on multiple readings during this visit above 130 for the top number or above 80 for the bottom number, please have this repeated by your primary care provider within one month. --------------  Thank you for allowing [place] to participate in your care today.

## 2017-10-29 ENCOUNTER — Ambulatory Visit (INDEPENDENT_AMBULATORY_CARE_PROVIDER_SITE_OTHER): Payer: Medicare HMO | Admitting: Orthopedic Surgery

## 2017-10-29 ENCOUNTER — Encounter (INDEPENDENT_AMBULATORY_CARE_PROVIDER_SITE_OTHER): Payer: Self-pay | Admitting: Orthopedic Surgery

## 2017-10-29 DIAGNOSIS — G5632 Lesion of radial nerve, left upper limb: Secondary | ICD-10-CM | POA: Diagnosis not present

## 2017-10-29 NOTE — Progress Notes (Signed)
Office Visit Note   Patient: Jacqueline Reese           Date of Birth: 1955-06-17           MRN: [id number] Visit Date: 10/29/2017              Requested by: Nolene Ebbs, MD [address] [place], [place] [address] PCP: Nolene Ebbs, MD  Chief Complaint  Patient presents with  . Left Leg - Pain  . Right Leg - Pain      HPI: Patient is a 62 year old woman who presents with a one-week history of radial nerve neuropathy left upper extremity.  She is currently wearing a Velcro splint she states she just woke up one morning with wrist drop unable to extend her wrist or fingers.  Assessment & Plan: Visit Diagnoses:  1. Radial nerve dysfunction, left     Plan: Patient has no neck symptoms this seems to be more of a peripheral nerve compression injury most likely temporary we will have her evaluated by Dr. Ernestina Patches to further evaluate the radial nerve injury.  Follow-Up Instructions: Return if symptoms worsen or fail to improve.   Ortho Exam  Patient is alert, oriented, no adenopathy, well-dressed, normal affect, normal respiratory effort. Examination patient ambulates with a cane.  Her neck is nontender with range of motion she has no neck pain she has no radicular symptoms the thoracic outlet is nontender to palpation.  The cubital tunnel is nontender to palpation.  Patient has active flexion and extension of the elbow and abduction of her shoulder.  Patient does not have extension of the wrist fingers or thumb.  Patient does have intrinsic function she can and has adduction and abduction of the fingers.  Imaging: No results found. No images are attached to the encounter.  Labs: Lab Results  Component Value Date   HGBA1C 8.4 01/19/2010   LABURIC 5.9 07/08/2009   LABURIC 9.9 (H) 06/06/2009   REPTSTATUS 06/04/2010 FINAL 06/02/2010   CULT  06/02/2010    GROUP B STREP(S.AGALACTIAE)ISOLATED Note: TESTING AGAINST S. AGALACTIAE NOT ROUTINELY PERFORMED DUE TO  PREDICTABILITY OF AMP/PEN/VAN SUSCEPTIBILITY.     Lab Results  Component Value Date   ALBUMIN 3.8 10/24/2017   ALBUMIN 3.0 (L) 09/16/2015   ALBUMIN 4.0 06/02/2010   LABURIC 5.9 07/08/2009   LABURIC 9.9 (H) 06/06/2009    There is no height or weight on file to calculate BMI.  Orders:  Orders Placed This Encounter  Procedures  . Ambulatory referral to Physical Medicine Rehab   No orders of the defined types were placed in this encounter.    Procedures: No procedures performed  Clinical Data: No additional findings.  ROS:  All other systems negative, except as noted in the HPI. Review of Systems  Objective: Vital Signs: LMP 05/09/2010   Specialty Comments:  No specialty comments available.  PMFS History: Patient Active Problem List   Diagnosis Date Noted  . Angioedema 09/13/2015  . Anaphylaxis   . Hypertension, essential   . Acute hypoxemic respiratory failure ([place])   . Type 2 diabetes mellitus without complication, without long-term current use of insulin ([place])   . DIAB W/O MENTION COMP TYPE II/UNS TYPE UNCNTRL 01/19/2010  . FOOT ULCER, RIGHT 01/19/2010  . MENOPAUSE-RELATED VASOMOTOR SYMPTOMS, HOT FLASHES 10/04/2009  . GOUT, UNSPECIFIED 06/07/2009  . LICHEN SIMPLEX CHRONICUS 11/30/2008  . OTITIS EXTERNA, CHRONIC 04/16/2008  . OBESITY, NOS 05/02/2006  . SCHIZOPHRENIA 05/02/2006  . HYPERTENSION, BENIGN SYSTEMIC 05/02/2006  . GASTROESOPHAGEAL  REFLUX, NO ESOPHAGITIS 05/02/2006   Past Medical History:  Diagnosis Date  . Arthritis   . Chronic back pain   . Depression   . Diabetes mellitus without complication ([place])   . Gout   . Hypertension   . Schizophrenic disorder ([place])     History reviewed. No pertinent family history.  Past Surgical History:  Procedure Laterality Date  . BREAST BIOPSY Left    core biopsy  . CESAREAN SECTION     x2  . NASAL ENDOSCOPY Left 09/13/2015   Procedure: NASAL ENDOSCOPY WITH NASAL INTABATION;  Surgeon: Jerrell Belfast,  MD;  Location: [facility] OR;  Service: ENT;  Laterality: Left;   Social History   Occupational History  . Not on file  Tobacco Use  . Smoking status: Current Every Day Smoker    Packs/day: 0.33    Types: Cigarettes  . Smokeless tobacco: Never Used  Substance and Sexual Activity  . Alcohol use: No  . Drug use: No  . Sexual activity: Not on file

## 2017-11-09 ENCOUNTER — Ambulatory Visit (INDEPENDENT_AMBULATORY_CARE_PROVIDER_SITE_OTHER): Payer: Medicare HMO

## 2017-11-09 ENCOUNTER — Ambulatory Visit (HOSPITAL_COMMUNITY)
Admission: EM | Admit: 2017-11-09 | Discharge: 2017-11-09 | Disposition: A | Discharge status: Home / Self Care | Payer: Medicare HMO | Attending: Internal Medicine | Admitting: Internal Medicine

## 2017-11-09 ENCOUNTER — Encounter (HOSPITAL_COMMUNITY): Payer: Self-pay

## 2017-11-09 DIAGNOSIS — M25551 Pain in right hip: Secondary | ICD-10-CM

## 2017-11-09 DIAGNOSIS — R52 Pain, unspecified: Secondary | ICD-10-CM

## 2017-11-09 DIAGNOSIS — W19XXXA Unspecified fall, initial encounter: Secondary | ICD-10-CM

## 2017-11-09 DIAGNOSIS — M1990 Unspecified osteoarthritis, unspecified site: Secondary | ICD-10-CM

## 2017-11-09 MED ORDER — NAPROXEN 375 MG PO TABS: 375.0000 mg | ORAL_TABLET | Freq: Two times a day (BID) | ORAL | 0 refills | Status: DC

## 2017-11-09 NOTE — Discharge Instructions (Signed)
Xray is normal here today.  You do have obvious severe arthritis.  You will likely have quite a bit of soreness after this fall.  Light and regular activity will be helpful with soreness.  Please restart naproxen twice a day, take with food.  Please follow up with your primary care provider next week for recheck.

## 2017-11-09 NOTE — ED Provider Notes (Signed)
[place]    CSN: [id number] Arrival date & time: 11/09/17  1013     History   Chief Complaint Chief Complaint  Patient presents with  . Fall    Right Lower Extremities    HPI Jacqueline Reese is a 62 y.o. female.   Jacqueline Reese presents with complaints of right hip and leg pain as well as some left leg pain after a fall at 0815 this morning. She was at [facility] and went into the restroom, the floor was wet which caused her to slip and fall, landing on her right hip. She required two people to help her up and then has used a scooter and wheelchair since. Pain with weight bearing. No previous orthopedic surgery or hip injury.  States she does typically ambulate with a cane however. Pain 8/10. Has not taken any medications for her pain. Did not hit head. No loss of bladder or bowel function. No numbness or tingling. States she has some left leg swelling as she did not take her fluid pill today. Feels general soreness to her legs. Hx of arthritis, chronic back pain, depression, htn, DM, schizophrenia.     ROS per HPI.      Past Medical History:  Diagnosis Date  . Arthritis   . Chronic back pain   . Depression   . Diabetes mellitus without complication ([place])   . Gout   . Hypertension   . Schizophrenic disorder [facility])     Patient Active Problem List   Diagnosis Date Noted  . Angioedema 09/13/2015  . Anaphylaxis   . Hypertension, essential   . Acute hypoxemic respiratory failure ([place])   . Type 2 diabetes mellitus without complication, without long-term current use of insulin ([place])   . DIAB W/O MENTION COMP TYPE II/UNS TYPE UNCNTRL 01/19/2010  . FOOT ULCER, RIGHT 01/19/2010  . MENOPAUSE-RELATED VASOMOTOR SYMPTOMS, HOT FLASHES 10/04/2009  . GOUT, UNSPECIFIED 06/07/2009  . LICHEN SIMPLEX CHRONICUS 11/30/2008  . OTITIS EXTERNA, CHRONIC 04/16/2008  . OBESITY, NOS 05/02/2006  . SCHIZOPHRENIA 05/02/2006  . HYPERTENSION, BENIGN SYSTEMIC 05/02/2006  .  GASTROESOPHAGEAL REFLUX, NO ESOPHAGITIS 05/02/2006    Past Surgical History:  Procedure Laterality Date  . BREAST BIOPSY Left    core biopsy  . CESAREAN SECTION     x2  . NASAL ENDOSCOPY Left 09/13/2015   Procedure: NASAL ENDOSCOPY WITH NASAL INTABATION;  Surgeon: Jerrell Belfast, MD;  Location: [place];  Service: ENT;  Laterality: Left;    OB History   None      Home Medications    Prior to Admission medications   Medication Sig Start Date End Date Taking? Authorizing Provider  acetaminophen (TYLENOL) 500 MG tablet Take 1,500 mg by mouth every 6 (six) hours as needed for mild pain.    [provider]  albuterol (PROVENTIL HFA;VENTOLIN HFA) 108 (90 BASE) MCG/ACT inhaler Inhale 2 puffs into the lungs every 4 (four) hours as needed for wheezing or shortness of breath. Patient not taking: Reported on 10/24/2017 05/09/13   Janne Napoleon, NP  allopurinol (ZYLOPRIM) 100 MG tablet Take 1 tablet (100 mg total) by mouth 2 (two) times daily. 08/28/17   Newt Minion, MD  amLODipine (NORVASC) 5 MG tablet Take 1 tablet (5 mg total) by mouth daily. 09/18/15   Whiteheart, Cristal Ford, NP  aspirin EC 325 MG tablet Take 325 mg by mouth daily.    [provider]  clonazePAM (KLONOPIN) 1 MG tablet Take 1 mg by mouth 3 (three)  times daily.     [provider]  famotidine (PEPCID) 40 MG tablet Take 1 tablet (40 mg total) by mouth daily. Patient not taking: Reported on 10/24/2017 09/18/15   Marijean Heath, NP  furosemide (LASIX) 20 MG tablet Take 1 tablet (20 mg total) by mouth daily. 10/24/17   Langston Masker B, PA-C  gabapentin (NEURONTIN) 300 MG capsule Take 2 capsules (600 mg total) by mouth 3 (three) times daily. 06/03/17   Newt Minion, MD  glipiZIDE (GLUCOTROL XL) 2.5 MG 24 hr tablet Take 2.5 mg by mouth daily.    [provider]  HYDROcodone-acetaminophen (NORCO) 10-325 MG tablet Take 1 tablet by mouth every 6 (six) hours as needed for pain. 10/16/17   [provider]  LINZESS 145 MCG CAPS capsule Take 145 mcg by mouth every 14 (fourteen) days. 09/14/17   [provider]  meloxicam (MOBIC) 7.5 MG tablet Take 1 tablet (7.5 mg total) by mouth daily. 12/23/16   Zigmund Gottron, NP  metFORMIN (GLUCOPHAGE) 500 MG tablet Take 500 mg by mouth 2 (two) times daily with a meal.    [provider]  nabumetone (RELAFEN) 750 MG tablet Take 1 tablet (750 mg total) by mouth 2 (two) times daily as needed for moderate pain. Patient taking differently: Take 750 mg by mouth daily.  05/03/17   Newt Minion, MD  naproxen (NAPROSYN) 375 MG tablet Take 1 tablet (375 mg total) by mouth 2 (two) times daily. 11/09/17   Zigmund Gottron, NP  potassium chloride SA (K-DUR,KLOR-CON) 20 MEQ tablet Take 1 tablet (20 mEq total) by mouth 2 (two) times daily for 3 days. 10/24/17 10/27/17  Langston Masker B, PA-C  spironolactone (ALDACTONE) 50 MG tablet Take 50 mg by mouth daily.    [provider]  ziprasidone (GEODON) 80 MG capsule Take 160 mg by mouth every evening.  07/13/15   [provider]    Family History History reviewed. No pertinent family history.  Social History Social History   Tobacco Use  . Smoking status: Current Every Day Smoker    Packs/day: 0.33    Types: Cigarettes  . Smokeless tobacco: Never Used  Substance Use Topics  . Alcohol use: No  . Drug use: No     Allergies   Lisinopril; Chantix [varenicline]; Doxycycline; Ibuprofen; Lamisil [terbinafine]; Cephalexin; and Sulfa antibiotics   Review of Systems Review of Systems   Physical Exam Triage Vital Signs ED Triage Vitals  Enc Vitals Group     BP 11/09/17 1114 101/69     Pulse Rate 11/09/17 1114 73     Resp 11/09/17 1114 20     Temp 11/09/17 1114 98.4 F (36.9 C)     Temp Source 11/09/17 1114 Oral     SpO2 11/09/17 1114 100 %     Weight --      Height --      Head Circumference --      Peak Flow --      Pain Score 11/09/17 1115 8     Pain Loc --        Pain Edu? --      Excl. in [place]? --    No data found.  Updated Vital Signs BP 101/69 (BP Location: Right Arm)   Pulse 73   Temp 98.4 F (36.9 C) (Oral)   Resp 20   LMP 05/09/2010   SpO2 100%    Physical Exam  Constitutional: She is oriented to person,  place, and time. She appears well-developed and well-nourished. No distress.  Cardiovascular: Normal rate, regular rhythm and normal heart sounds.  Pulmonary/Chest: Effort normal and breath sounds normal.  Musculoskeletal:       Right hip: She exhibits decreased range of motion, tenderness and bony tenderness. She exhibits normal strength, no swelling, no crepitus, no deformity and no laceration.       Left hip: She exhibits no tenderness and no bony tenderness.       Lumbar back: She exhibits no tenderness, no bony tenderness and no swelling.  Generalized left hip/ thigh pain with ambulation but without any point tenderness; proximal femur tenderness to right hip, pain with hip external rotation; no pain with right hip flexion or extension; no pain to bilateral knees on palpation; no redness, swelling or bruising noted; +1 pitting edema noted to LLE; calves soft bilaterally; ambulatory with moderate assist and with complaints of generalized "soreness" as well as pain to right hip; noted frequent popping of low lumbar region with activity but non tender on palpation, no step off or deformity; sensation intact bilaterally   Neurological: She is alert and oriented to person, place, and time.  Skin: Skin is warm and dry.     UC Treatments / Results  Labs (all labs ordered are listed, but only abnormal results are displayed) Labs Reviewed - No data to display  EKG None  Radiology Dg Hip Unilat With Pelvis 2-3 Views Right  Result Date: 11/09/2017 CLINICAL DATA:  Pelvic and right hip pain post fall. EXAM: DG HIP (WITH OR WITHOUT PELVIS) 2-3V RIGHT COMPARISON:  06/15/2015 FINDINGS: There is no evidence of hip fracture or dislocation.  Advanced osteoarthritic changes of bilateral hip joints with near complete effacement of the joint space, extensive subchondral sclerosis, subchondral cysts and exuberant osteophyte formation. IMPRESSION: No acute fracture or dislocation identified about the right hip or pelvis. Advanced osteoarthritic changes of bilateral hip joints. Electronically Signed   By: Fidela Salisbury M.D.   On: 11/09/2017 12:11    Procedures Procedures (including critical care time)  Medications Ordered in UC Medications - No data to display  Initial Impression / Assessment and Plan / UC Course  I have reviewed the triage vital signs and the nursing notes.  Pertinent labs & imaging results that were available during my care of the patient were reviewed by me and considered in my medical decision making (see chart for details).     Xray with significant arthritis, no fracture visualized. Patient is ambulatory but with pain. No other red flag findings. Expecting pain and soreness s/p fall. Naproxen twice a day recommended, ice application, light and regularly activity. Encouraged follow up with pcp in the next week for recheck. Provided wheel chair assist out to ride. Patient verbalized understanding and agreeable to plan.    Final Clinical Impressions(s) / UC Diagnoses   Final diagnoses:  Pain  Pain of right hip joint  Fall, initial encounter  Arthritis     Discharge Instructions     Xray is normal here today.  You do have obvious severe arthritis.  You will likely have quite a bit of soreness after this fall.  Light and regular activity will be helpful with soreness.  Please restart naproxen twice a day, take with food.  Please follow up with your primary care provider next week for recheck.     ED Prescriptions    Medication Sig Dispense Auth. Provider   naproxen (NAPROSYN) 375 MG tablet Take 1 tablet (375  mg total) by mouth 2 (two) times daily. 20 tablet Zigmund Gottron, NP     Controlled  Substance Prescriptions [place] Controlled Substance Registry consulted? Not Applicable   Zigmund Gottron, NP 11/09/17 1232

## 2017-11-09 NOTE — ED Triage Notes (Signed)
Pt presents with pain in right hip, leg, knee and ankle from a fall today at [employer] on wet floor.

## 2017-11-14 ENCOUNTER — Other Ambulatory Visit (INDEPENDENT_AMBULATORY_CARE_PROVIDER_SITE_OTHER): Payer: Self-pay | Admitting: Orthopedic Surgery

## 2017-11-15 NOTE — Telephone Encounter (Signed)
Ok for refill? 

## 2017-11-18 ENCOUNTER — Telehealth (INDEPENDENT_AMBULATORY_CARE_PROVIDER_SITE_OTHER): Payer: Self-pay | Admitting: *Deleted

## 2017-11-18 ENCOUNTER — Telehealth (INDEPENDENT_AMBULATORY_CARE_PROVIDER_SITE_OTHER): Payer: Self-pay | Admitting: Orthopedic Surgery

## 2017-11-18 NOTE — Telephone Encounter (Signed)
Please advise. Would you like for me to make patient an appointment? 

## 2017-11-18 NOTE — Telephone Encounter (Signed)
Patient called needing Rx refilled (Naproxen) The number to contact patient is [phone]

## 2017-11-18 NOTE — Telephone Encounter (Signed)
Pt called left vm on triage line requesting pain medications, stated she fell last night on her L leg and is painful and wanted to see if MD could call her some pain medications instead of having to come in to be seen.   Please advise [phone]

## 2017-11-18 NOTE — Telephone Encounter (Signed)
Patient would need to be seen to see if she needs pain med

## 2017-11-19 ENCOUNTER — Other Ambulatory Visit (INDEPENDENT_AMBULATORY_CARE_PROVIDER_SITE_OTHER): Payer: Self-pay | Admitting: Orthopedic Surgery

## 2017-11-19 MED ORDER — NAPROXEN 375 MG PO TABS: 375.0000 mg | ORAL_TABLET | Freq: Two times a day (BID) | ORAL | 0 refills | Status: DC

## 2017-11-19 NOTE — Telephone Encounter (Signed)
I called patient and advised. She has appointment next week. Duplicate message in the chart where patient is now requesting naproxen refill until follow up.  Message sent to Dr. Sharol Given.

## 2017-11-19 NOTE — Telephone Encounter (Signed)
rx for naprosyn sent

## 2017-11-19 NOTE — Telephone Encounter (Signed)
I left voicemail for patient advising. 

## 2017-11-19 NOTE — Telephone Encounter (Signed)
I called patient and advised she would have to come in and be seen if requesting pain medication due to recent fall. She has appointment next week. She would like to know if you will refill her Naproxen until she can get to the office for her appointment.  Please advise.

## 2017-11-22 ENCOUNTER — Ambulatory Visit (INDEPENDENT_AMBULATORY_CARE_PROVIDER_SITE_OTHER): Payer: Medicare HMO | Admitting: Physical Medicine and Rehabilitation

## 2017-11-22 ENCOUNTER — Encounter (INDEPENDENT_AMBULATORY_CARE_PROVIDER_SITE_OTHER): Payer: Self-pay | Admitting: Physical Medicine and Rehabilitation

## 2017-11-22 DIAGNOSIS — R531 Weakness: Secondary | ICD-10-CM

## 2017-11-22 DIAGNOSIS — R202 Paresthesia of skin: Secondary | ICD-10-CM | POA: Diagnosis not present

## 2017-11-22 NOTE — Progress Notes (Signed)
 .  Numeric Pain Rating Scale and Functional Assessment Average Pain 0   In the last MONTH (on 0-10 scale) has pain interfered with the following?  1. General activity like being  able to carry out your everyday physical activities such as walking, climbing stairs, carrying groceries, or moving a chair?  Rating(4)   +Driver, -BT, -Dye Allergies.

## 2017-11-25 NOTE — Procedures (Signed)
EMG & NCV Findings: Evaluation of the left median motor nerve showed decreased conduction velocity (Elbow-Wrist, 49 m/s).  The left radial motor nerve showed decreased conduction velocity (Up Arm-8cm, 51 m/s).  The left ulnar motor nerve showed decreased conduction velocity (B Elbow-Wrist, 38 m/s) and decreased conduction velocity (A Elbow-B Elbow, 42 m/s).  The left median (across palm) sensory nerve showed prolonged distal peak latency (Wrist, 4.6 ms).  The left ulnar sensory nerve showed prolonged distal peak latency (4.5 ms), reduced amplitude (9.3 V), and decreased conduction velocity (Wrist-5th Digit, 31 m/s).  All remaining nerves (as indicated in the following tables) were within normal limits.    Needle evaluation of the left extensor indicis muscle showed increased insertional activity, widespread spontaneous activity, and diminished recruitment.  The left first dorsal interosseous muscle showed increased insertional activity and diminished recruitment.  The left extensor digitorum communis muscle showed increased insertional activity, widespread spontaneous activity, increased motor unit amplitude, and diminished recruitment.  All remaining muscles (as indicated in the following table) showed no evidence of electrical instability.    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  She has gotten some improvement with straining since the first day of weakness and there are positive motor unit action potentials on needle EMG that does suggest possibility of recovery.  Interestingly the  radial sensory nerve is intact and this could suggest possibility of a lesion at the radial tunnel but clinically this would be not what you would usually see with a radial tunnel syndrome.  Recommendations: 1.  Follow-up with referring physician. 2.  Continue current management of symptoms. 3.  Possible suggestion of brachial plexus/shoulder MRI to rule out lesion.  ___________________________ Laurence Spates FAAPMR Board Certified, American Board of Physical Medicine and Rehabilitation    Nerve Conduction Studies Anti Sensory Summary Table   Stim Site NR Peak (ms) Norm Peak (ms) P-T Amp (V) Norm P-T Amp Site1 Site2 Delta-P (ms) Dist (cm) Vel (m/s) Norm Vel (m/s)  Left Median Acr Palm Anti Sensory (2nd Digit)  31.1C  Wrist    *4.6 <3.6 49.4 >10 Wrist Palm 2.6 0.0    Palm    2.0 <2.0 5.1         Left Radial Anti Sensory (Base 1st Digit)  30.9C  Wrist    2.4 <3.1 45.0  Wrist Base 1st Digit 2.4 0.0    Left Ulnar Anti Sensory (5th Digit)  31.5C  Wrist    *4.5 <3.7 *9.3 >15.0 Wrist 5th Digit 4.5 14.0 *31 >38   Motor Summary Table   Stim Site NR Onset (ms) Norm Onset (ms) O-P Amp (mV) Norm O-P Amp Site1 Site2 Delta-0 (ms) Dist (cm) Vel (m/s) Norm Vel (m/s)  Left Median Motor (Abd Poll Brev)  30.9C  Wrist    4.2 <4.2 8.1 >5 Elbow Wrist 4.5 22.0 *49 >50  Elbow    8.7  7.3         Left Radial Motor (Ext Indicis)  30C  8cm    2.3 <2.5 2.0 >1.7 Up Arm 8cm 4.0 20.5 *51 >60  Up Arm    6.3  2.2  Right Radial Motor (Ext Indicis)  29.7C  8cm    2.4 <2.5 5.6 >1.7 Up Arm 8cm 3.8 24.0 63 >60  Up Arm    6.2  4.2         Left Ulnar Motor (Abd Dig Min)  30.8C  Wrist    4.1 <4.2 4.4 >3 B Elbow Wrist 4.9 18.5 *38 >53  B Elbow    9.0  3.2  A Elbow B Elbow 2.4 10.0 *42 >53  A Elbow    11.4  3.9          EMG   Side Muscle Nerve Root Ins Act Fibs Psw Amp Dur Poly Recrt Int Fraser Din Comment  Left ExtIndicis Radial (Post Int) C7-8 *Incr *4+ *4+ Nml Nml 0 *Reduced Nml few muap  Left 1stDorInt  Ulnar C8-T1 *Incr Nml Nml Nml Nml 0 *Reduced Nml   Left Abd Poll Brev Median C8-T1 Nml Nml Nml Nml Nml 0 Nml Nml   Left ExtDigCom   *Incr *4+ *4+ *Incr Nml 0 *Reduced Nml pos muap  Left Triceps Radial C6-7-8 Nml Nml Nml Nml Nml 0 Nml Nml   Left Deltoid Axillary C5-6 Nml Nml Nml Nml Nml 0 Nml Nml     Nerve Conduction Studies Anti Sensory Left/Right Comparison   Stim Site L Lat (ms) R Lat (ms) L-R Lat (ms) L Amp (V) R Amp (V) L-R Amp (%) Site1 Site2 L Vel (m/s) R Vel (m/s) L-R Vel (m/s)  Median Acr Palm Anti Sensory (2nd Digit)  31.1C  Wrist *4.6   49.4   Wrist Palm     Palm 2.0   5.1         Radial Anti Sensory (Base 1st Digit)  30.9C  Wrist 2.4   45.0   Wrist Base 1st Digit     Ulnar Anti Sensory (5th Digit)  31.5C  Wrist *4.5   *9.3   Wrist 5th Digit *31     Motor Left/Right Comparison   Stim Site L Lat (ms) R Lat (ms) L-R Lat (ms) L Amp (mV) R Amp (mV) L-R Amp (%) Site1 Site2 L Vel (m/s) R Vel (m/s) L-R Vel (m/s)  Median Motor (Abd Poll Brev)  30.9C  Wrist 4.2   8.1   Elbow Wrist *49    Elbow 8.7   7.3         Radial Motor (Ext Indicis)  30C  8cm 2.3 2.4 0.1 2.0 5.6 64.3 Up Arm 8cm *51 63 12  Up Arm 6.3 6.2 0.1 2.2 4.2 47.6       Ulnar Motor (Abd Dig Min)  30.8C  Wrist 4.1   4.4   B Elbow Wrist *38    B Elbow 9.0   3.2   A Elbow B Elbow *42    A Elbow 11.4   3.9            Waveforms:

## 2017-11-26 ENCOUNTER — Ambulatory Visit (INDEPENDENT_AMBULATORY_CARE_PROVIDER_SITE_OTHER): Payer: Medicare HMO | Admitting: Orthopedic Surgery

## 2017-11-26 ENCOUNTER — Encounter (INDEPENDENT_AMBULATORY_CARE_PROVIDER_SITE_OTHER): Payer: Self-pay | Admitting: Orthopedic Surgery

## 2017-11-26 VITALS — Ht 62.0 in | Wt 143.0 lb

## 2017-11-26 DIAGNOSIS — G5632 Lesion of radial nerve, left upper limb: Secondary | ICD-10-CM | POA: Diagnosis not present

## 2017-11-26 NOTE — Progress Notes (Signed)
Jacqueline Reese - 62 y.o. female MRN [id number]  Date of birth: 07-23-1955  Office Visit Note: Visit Date: 11/22/2017 PCP: Nolene Ebbs, MD Referred by: Nolene Ebbs, MD  Subjective: Chief Complaint  Patient presents with  . Left Hand - Other, Weakness  . Left Little Finger - Numbness   HPI: Ms. Frein is a 62 year old right-hand-dominant female who comes in today at the request of Dr. Meridee Score for electrodiagnostic study of the left upper limb.  She reports that she awakened on the morning of 10/24/2017 with weakness of wrist extension.  She reports no pain or illnesses leading up to this event.  She now reports no real pain just weakness.  She does get some tingling in the fifth digit.  She reports some strengthening since 822 with increased ability to extend the wrist and fingers a small degree.  She does not have trouble gripping.  She has no trouble on the right side.  She has no pain or radicular symptoms down the arm.  No frank neck pain.  Her case is complicated by type 2 diabetes.  We do not have any current hemoglobin A1c is on the chart or in the labs.  She is non-insulin-dependent.  Her case is also complicated by schizophrenia.  She does report that she woke up on the morning of 10/24/2017 with her arm tucked under her chin and fell asleep that way.   ROS Otherwise per HPI.  Assessment & Plan: Visit Diagnoses:  1. Paresthesia of skin   2. 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  She has gotten some improvement with straining  since the first day of weakness and there are positive motor unit action potentials on needle EMG that does suggest possibility of recovery.  Interestingly the radial sensory nerve is intact and this could suggest possibility of a lesion at the radial tunnel but clinically this would be not what you would usually see with a radial tunnel syndrome.  Recommendations: 1.  Follow-up with referring physician. 2.  Continue current management of symptoms. 3.  Possible suggestion of brachial plexus/shoulder MRI to rule out lesion.   Meds & Orders: No orders of the defined types were placed in this encounter.   Orders Placed This Encounter  Procedures  . NCV with EMG (electromyography)    Follow-up: Return for  Meridee Score, M.D..   Procedures: No procedures performed  EMG & NCV Findings: Evaluation of the left median motor nerve showed decreased conduction velocity (Elbow-Wrist, 49 m/s).  The left radial motor nerve showed decreased conduction velocity (Up Arm-8cm, 51 m/s).  The left ulnar motor nerve showed decreased conduction velocity (B Elbow-Wrist, 38 m/s) and decreased conduction velocity (A Elbow-B Elbow, 42 m/s).  The left median (across palm) sensory nerve showed prolonged distal peak latency (Wrist, 4.6 ms).  The left ulnar sensory nerve showed prolonged distal peak latency (4.5 ms), reduced amplitude (9.3 V), and decreased conduction velocity (Wrist-5th Digit, 31 m/s).  All remaining nerves (as indicated in the following tables) were within normal limits.    Needle evaluation of the  left extensor indicis muscle showed increased insertional activity, widespread spontaneous activity, and diminished recruitment.  The left first dorsal interosseous muscle showed increased insertional activity and diminished recruitment.  The left extensor digitorum communis muscle showed increased insertional activity, widespread spontaneous activity, increased motor unit amplitude, and diminished recruitment.   All remaining muscles (as indicated in the following table) showed no evidence of electrical instability.    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  She has gotten some improvement with straining since the first day of weakness and there are positive motor unit action potentials on needle EMG that does suggest possibility of recovery.  Interestingly the radial sensory nerve is intact and this could suggest possibility of a lesion at the radial tunnel but clinically this would be not what you would usually see with a radial tunnel syndrome.  Recommendations: 1.  Follow-up with referring physician. 2.  Continue current management of symptoms. 3.  Possible suggestion of brachial plexus/shoulder MRI to rule out lesion.  ___________________________ Laurence Spates FAAPMR Board Certified, American Board of Physical Medicine and Rehabilitation    Nerve Conduction Studies Anti Sensory Summary Table   Stim Site NR Peak (ms) Norm Peak (ms) P-T Amp (V) Norm P-T Amp Site1 Site2 Delta-P (ms) Dist (cm) Vel (m/s) Norm Vel (m/s)  Left Median Acr Palm Anti Sensory (2nd Digit)  31.1C  Wrist    *4.6 <3.6 49.4 >10 Wrist Palm 2.6 0.0    Palm    2.0 <2.0 5.1         Left Radial Anti Sensory (Base 1st Digit)  30.9C  Wrist    2.4 <3.1 45.0  Wrist Base 1st Digit 2.4 0.0    Left Ulnar Anti Sensory (5th Digit)  31.5C  Wrist    *4.5 <3.7 *9.3 >15.0 Wrist 5th Digit 4.5 14.0 *31 >38   Motor Summary Table   Stim Site NR Onset (ms) Norm Onset (ms) O-P Amp (mV) Norm O-P Amp Site1 Site2 Delta-0  (ms) Dist (cm) Vel (m/s) Norm Vel (m/s)  Left Median Motor (Abd Poll Brev)  30.9C  Wrist    4.2 <4.2 8.1 >5 Elbow Wrist 4.5 22.0 *49 >50  Elbow    8.7  7.3         Left Radial Motor (Ext Indicis)  30C  8cm    2.3 <2.5 2.0 >1.7 Up Arm 8cm 4.0 20.5 *51 >60  Up Arm    6.3  2.2         Right Radial Motor (Ext Indicis)  29.7C  8cm    2.4 <2.5 5.6 >1.7 Up Arm 8cm 3.8 24.0 63 >60  Up Arm    6.2  4.2         Left Ulnar Motor (Abd Dig Min)  30.8C  Wrist    4.1 <4.2 4.4 >3 B Elbow Wrist 4.9 18.5 *38 >53  B Elbow    9.0  3.2  A Elbow B Elbow 2.4 10.0 *42 >53  A Elbow    11.4  3.9          EMG   Side Muscle Nerve Root Ins  Act Fibs Psw Amp Dur Poly Recrt Int Fraser Din Comment  Left ExtIndicis Radial (Post Int) C7-8 *Incr *4+ *4+ Nml Nml 0 *Reduced Nml few muap  Left 1stDorInt Ulnar C8-T1 *Incr Nml Nml Nml Nml 0 *Reduced Nml   Left Abd Poll Brev Median C8-T1 Nml Nml Nml Nml Nml 0 Nml Nml   Left ExtDigCom   *Incr *4+ *4+ *Incr Nml 0 *Reduced Nml pos muap  Left Triceps Radial C6-7-8 Nml Nml Nml Nml Nml 0 Nml Nml   Left Deltoid Axillary C5-6 Nml Nml Nml Nml Nml 0 Nml Nml     Nerve Conduction Studies Anti Sensory Left/Right Comparison   Stim Site L Lat (ms) R Lat (ms) L-R Lat (ms) L Amp (V) R Amp (V) L-R Amp (%) Site1 Site2 L Vel (m/s) R Vel (m/s) L-R Vel (m/s)  Median Acr Palm Anti Sensory (2nd Digit)  31.1C  Wrist *4.6   49.4   Wrist Palm     Palm 2.0   5.1         Radial Anti Sensory (Base 1st Digit)  30.9C  Wrist 2.4   45.0   Wrist Base 1st Digit     Ulnar Anti Sensory (5th Digit)  31.5C  Wrist *4.5   *9.3   Wrist 5th Digit *31     Motor Left/Right Comparison   Stim Site L Lat (ms) R Lat (ms) L-R Lat (ms) L Amp (mV) R Amp (mV) L-R Amp (%) Site1 Site2 L Vel (m/s) R Vel (m/s) L-R Vel (m/s)  Median Motor (Abd Poll Brev)  30.9C  Wrist 4.2   8.1   Elbow Wrist *49    Elbow 8.7   7.3         Radial Motor (Ext Indicis)  30C  8cm 2.3 2.4 0.1 2.0 5.6 64.3 Up Arm 8cm *51 63 12  Up Arm 6.3  6.2 0.1 2.2 4.2 47.6       Ulnar Motor (Abd Dig Min)  30.8C  Wrist 4.1   4.4   B Elbow Wrist *38    B Elbow 9.0   3.2   A Elbow B Elbow *42    A Elbow 11.4   3.9            Waveforms:                Clinical History: No specialty comments available.   She reports that she has been smoking cigarettes. She has been smoking about 0.33 packs per day. She has never used smokeless tobacco. No results for input(s): HGBA1C, LABURIC in the last 8760 hours.  Objective:  VS:  HT:    WT:   BMI:     BP:   HR: bpm  TEMP: ( )  RESP:  Physical Exam  Musculoskeletal:  Inspection reveals no atrophy of the bilateral APB or FDI or hand intrinsics. There is no swelling, color changes, allodynia or dystrophic changes.  Patient has 1 out of 5 left wrist extension and finger extension.  She has some weakness with bicep flexion but mild.  Right side and other muscle groups are normal.  There is intact sensation to light touch in all dermatomal and peripheral nerve distributions.  There is a negative Hoffmann's test bilaterally.    Ortho Exam Imaging: No results found.  Past Medical/Family/Surgical/Social History: Medications & Allergies reviewed per EMR, new medications updated. Patient Active Problem List   Diagnosis Date Noted  . Angioedema 09/13/2015  . Anaphylaxis   .  Hypertension, essential   . Acute hypoxemic respiratory failure ([place])   . Type 2 diabetes mellitus without complication, without long-term current use of insulin ([place])   . DIAB W/O MENTION COMP TYPE II/UNS TYPE UNCNTRL 01/19/2010  . FOOT ULCER, RIGHT 01/19/2010  . MENOPAUSE-RELATED VASOMOTOR SYMPTOMS, HOT FLASHES 10/04/2009  . GOUT, UNSPECIFIED 06/07/2009  . LICHEN SIMPLEX CHRONICUS 11/30/2008  . OTITIS EXTERNA, CHRONIC 04/16/2008  . OBESITY, NOS 05/02/2006  . SCHIZOPHRENIA 05/02/2006  . HYPERTENSION, BENIGN SYSTEMIC 05/02/2006  . GASTROESOPHAGEAL REFLUX, NO ESOPHAGITIS 05/02/2006   Past Medical History:  Diagnosis  Date  . Arthritis   . Chronic back pain   . Depression   . Diabetes mellitus without complication ([place])   . Gout   . Hypertension   . Schizophrenic disorder ([place])    History reviewed. No pertinent family history. Past Surgical History:  Procedure Laterality Date  . BREAST BIOPSY Left    core biopsy  . CESAREAN SECTION     x2  . NASAL ENDOSCOPY Left 09/13/2015   Procedure: NASAL ENDOSCOPY WITH NASAL INTABATION;  Surgeon: Jerrell Belfast, MD;  Location: [facility] OR;  Service: ENT;  Laterality: Left;   Social History   Occupational History  . Not on file  Tobacco Use  . Smoking status: Current Every Day Smoker    Packs/day: 0.33    Types: Cigarettes  . Smokeless tobacco: Never Used  Substance and Sexual Activity  . Alcohol use: No  . Drug use: No  . Sexual activity: Not on file

## 2017-11-28 ENCOUNTER — Telehealth (INDEPENDENT_AMBULATORY_CARE_PROVIDER_SITE_OTHER): Payer: Self-pay | Admitting: Orthopedic Surgery

## 2017-11-28 NOTE — Telephone Encounter (Signed)
Please advise. Thank you

## 2017-11-28 NOTE — Telephone Encounter (Signed)
Patient called needing Rx refilled (Prednisone) The number to contact patient is [phone]

## 2017-11-29 ENCOUNTER — Other Ambulatory Visit (INDEPENDENT_AMBULATORY_CARE_PROVIDER_SITE_OTHER): Payer: Self-pay | Admitting: Orthopedic Surgery

## 2017-11-29 ENCOUNTER — Other Ambulatory Visit (INDEPENDENT_AMBULATORY_CARE_PROVIDER_SITE_OTHER): Payer: Self-pay

## 2017-11-29 MED ORDER — PREDNISONE 10 MG PO TABS: 20.0000 mg | ORAL_TABLET | Freq: Every day | ORAL | 0 refills | Status: DC

## 2017-11-29 NOTE — Telephone Encounter (Signed)
Ok to rf? 

## 2017-11-29 NOTE — Telephone Encounter (Signed)
Patient said pharmacy has not received prescription, could you send it again?

## 2017-11-29 NOTE — Telephone Encounter (Signed)
Medication refill  Naproxen 375mg  tablet

## 2017-11-29 NOTE — Telephone Encounter (Signed)
Ok refill? 

## 2017-11-29 NOTE — Telephone Encounter (Signed)
Patient Rx has been refilled and faxed to her pharmacy.

## 2017-11-30 NOTE — Telephone Encounter (Signed)
Ok to refill 

## 2017-11-30 NOTE — Telephone Encounter (Signed)
No, not if she is taking prednisone

## 2017-12-01 NOTE — Telephone Encounter (Signed)
Rx sent 

## 2017-12-01 NOTE — Telephone Encounter (Signed)
Patient was given prednisone on 11/29/17.  Please advise on refill naproxen?  Thanks.

## 2017-12-02 ENCOUNTER — Encounter (INDEPENDENT_AMBULATORY_CARE_PROVIDER_SITE_OTHER): Payer: Self-pay | Admitting: Orthopedic Surgery

## 2017-12-02 NOTE — Progress Notes (Signed)
Office Visit Note   Patient: Jacqueline Reese           Date of Birth: 06-Dec-1955           MRN: [id number] Visit Date: 11/26/2017              Requested by: Nolene Ebbs, MD [address] [place], [place] [address] PCP: Nolene Ebbs, MD  Chief Complaint  Patient presents with  . Right Leg - Pain  . Left Leg - Pain  . Right Wrist - Pain      HPI: Patient is a 62 year old woman who presents in follow-up for multiple medical problems.  Patient states she has pain in all of her joints.  She is currently in pain clinic currently on prednisone.  Patient does have a history of gout she is on a allopurinol.  Patient did have radial nerve dysfunction of the left upper extremity nerve conduction studies were obtained and this was consistent with Parsonage-Turner syndrome and patient states she feels like her arm is getting stronger.  Assessment & Plan: Visit Diagnoses:  1. Radial nerve dysfunction, left     Plan: Recommended continuing with the prednisone for the inflammatory processes.  Recommended exercise at the [facility].  Follow-Up Instructions: Return in about 4 weeks (around 12/24/2017).   Ortho Exam  Patient is alert, oriented, no adenopathy, well-dressed, normal affect, normal respiratory effort. Examination patient's left upper extremity shows interval strength and improvement.  The radial nerve dysfunction is improving slowly.  Patient complains of pain in multiple joints these are examined there is no signs of infection no signs of acute gouty symptoms.  Imaging: No results found. No images are attached to the encounter.  Labs: Lab Results  Component Value Date   HGBA1C 8.4 01/19/2010   LABURIC 5.9 07/08/2009   LABURIC 9.9 (H) 06/06/2009   REPTSTATUS 06/04/2010 FINAL 06/02/2010   CULT  06/02/2010    GROUP B STREP(S.AGALACTIAE)ISOLATED Note: TESTING AGAINST S. AGALACTIAE NOT ROUTINELY PERFORMED DUE TO PREDICTABILITY OF AMP/PEN/VAN SUSCEPTIBILITY.     Lab  Results  Component Value Date   ALBUMIN 3.8 10/24/2017   ALBUMIN 3.0 (L) 09/16/2015   ALBUMIN 4.0 06/02/2010   LABURIC 5.9 07/08/2009   LABURIC 9.9 (H) 06/06/2009    Body mass index is 26.16 kg/m.  Orders:  No orders of the defined types were placed in this encounter.  No orders of the defined types were placed in this encounter.    Procedures: No procedures performed  Clinical Data: No additional findings.  ROS:  All other systems negative, except as noted in the HPI. Review of Systems  Objective: Vital Signs: Ht 5\' 2"  (1.575 m)   Wt 143 lb (64.9 kg)   LMP 05/09/2010   BMI 26.16 kg/m   Specialty Comments:  No specialty comments available.  PMFS History: Patient Active Problem List   Diagnosis Date Noted  . Angioedema 09/13/2015  . Anaphylaxis   . Hypertension, essential   . Acute hypoxemic respiratory failure ([place])   . Type 2 diabetes mellitus without complication, without long-term current use of insulin ([place])   . DIAB W/O MENTION COMP TYPE II/UNS TYPE UNCNTRL 01/19/2010  . FOOT ULCER, RIGHT 01/19/2010  . MENOPAUSE-RELATED VASOMOTOR SYMPTOMS, HOT FLASHES 10/04/2009  . GOUT, UNSPECIFIED 06/07/2009  . LICHEN SIMPLEX CHRONICUS 11/30/2008  . OTITIS EXTERNA, CHRONIC 04/16/2008  . OBESITY, NOS 05/02/2006  . SCHIZOPHRENIA 05/02/2006  . HYPERTENSION, BENIGN SYSTEMIC 05/02/2006  . GASTROESOPHAGEAL REFLUX, NO ESOPHAGITIS 05/02/2006   Past Medical  History:  Diagnosis Date  . Arthritis   . Chronic back pain   . Depression   . Diabetes mellitus without complication ([place])   . Gout   . Hypertension   . Schizophrenic disorder ([place])     History reviewed. No pertinent family history.  Past Surgical History:  Procedure Laterality Date  . BREAST BIOPSY Left    core biopsy  . CESAREAN SECTION     x2  . NASAL ENDOSCOPY Left 09/13/2015   Procedure: NASAL ENDOSCOPY WITH NASAL INTABATION;  Surgeon: Jerrell Belfast, MD;  Location: [facility] OR;  Service: ENT;  Laterality:  Left;   Social History   Occupational History  . Not on file  Tobacco Use  . Smoking status: Current Every Day Smoker    Packs/day: 0.33    Types: Cigarettes  . Smokeless tobacco: Never Used  Substance and Sexual Activity  . Alcohol use: No  . Drug use: No  . Sexual activity: Not on file

## 2017-12-02 NOTE — Telephone Encounter (Signed)
She cannot take prednisone and the Naprosyn together.

## 2017-12-02 NOTE — Telephone Encounter (Signed)
Patient should not use the Naprosyn with the prednisone.

## 2017-12-03 ENCOUNTER — Other Ambulatory Visit (INDEPENDENT_AMBULATORY_CARE_PROVIDER_SITE_OTHER): Payer: Self-pay

## 2017-12-03 ENCOUNTER — Telehealth (INDEPENDENT_AMBULATORY_CARE_PROVIDER_SITE_OTHER): Payer: Self-pay

## 2017-12-03 ENCOUNTER — Telehealth (INDEPENDENT_AMBULATORY_CARE_PROVIDER_SITE_OTHER): Payer: Self-pay | Admitting: Orthopedic Surgery

## 2017-12-03 MED ORDER — GABAPENTIN 300 MG PO CAPS: 600.0000 mg | ORAL_CAPSULE | Freq: Three times a day (TID) | ORAL | 3 refills | Status: DC

## 2017-12-03 NOTE — Telephone Encounter (Signed)
Ok refill? 

## 2017-12-03 NOTE — Telephone Encounter (Signed)
Pt lled and states that she would like to have a refill on her Neurontin that she takes 300 mg 2 caps  tid. Uses the [facility] on [address]

## 2017-12-03 NOTE — Telephone Encounter (Signed)
Patient called advised her fingers are weak on both hands. Patient said she do not want to take the neurontin. Patient said she is trying to figure out what's happening to her hands. The number to contact patient is [phone]

## 2017-12-03 NOTE — Telephone Encounter (Signed)
done

## 2017-12-04 NOTE — Telephone Encounter (Signed)
I called and sw pt and she states that she does want the rx for neurontin  And I advised that this has been faxed into the pharm yesterday. She is to continue with her exercise and finish her prednisone and keep her appt with Dr. Sharol Given on 12/26/17. She had completed NCS and this was discussed at last visit. Pt will call with questions.

## 2017-12-13 ENCOUNTER — Other Ambulatory Visit (INDEPENDENT_AMBULATORY_CARE_PROVIDER_SITE_OTHER): Payer: Self-pay | Admitting: Orthopedic Surgery

## 2017-12-26 ENCOUNTER — Encounter (INDEPENDENT_AMBULATORY_CARE_PROVIDER_SITE_OTHER): Payer: Self-pay | Admitting: Orthopedic Surgery

## 2017-12-26 ENCOUNTER — Ambulatory Visit (INDEPENDENT_AMBULATORY_CARE_PROVIDER_SITE_OTHER): Payer: Medicare HMO | Admitting: Orthopedic Surgery

## 2017-12-26 VITALS — Ht 62.0 in | Wt 143.0 lb

## 2017-12-26 DIAGNOSIS — G5632 Lesion of radial nerve, left upper limb: Secondary | ICD-10-CM

## 2017-12-27 ENCOUNTER — Encounter (INDEPENDENT_AMBULATORY_CARE_PROVIDER_SITE_OTHER): Payer: Self-pay | Admitting: Orthopedic Surgery

## 2017-12-27 MED ORDER — PREDNISONE 10 MG PO TABS: ORAL_TABLET | ORAL | 0 refills | Status: DC

## 2017-12-27 MED ORDER — GABAPENTIN 300 MG PO CAPS: 600.0000 mg | ORAL_CAPSULE | Freq: Three times a day (TID) | ORAL | 3 refills | Status: DC

## 2017-12-27 NOTE — Progress Notes (Signed)
Office Visit Note   Patient: Jacqueline Reese           Date of Birth: 01-28-1956           MRN: [id number] Visit Date: 12/26/2017              Requested by: Nolene Ebbs, MD [address] [place], [place] [address] PCP: Nolene Ebbs, MD  Chief Complaint  Patient presents with  . Left Wrist - Follow-up  . Lower Back - Follow-up      HPI: Patient is a 62 year old woman who presents in follow-up for a radial nerve palsy of the left wrist.  Patient is wearing a Velcro splint she states that her strength and range of motion is improving.  Patient uses a rolling walker for ambulation this may be the source of the radial nerve impingement  Assessment & Plan: Visit Diagnoses:  1. Radial nerve dysfunction, left     Plan: We will call in a prescription for her prednisone and Neurontin.  Patient will wean out of the Velcro splint.  Follow-Up Instructions: Return in about 3 months (around 03/28/2018).   Ortho Exam  Patient is alert, oriented, no adenopathy, well-dressed, normal affect, normal respiratory effort. Examination patient has equal strength in both upper extremities with active extension of the thumb and wrist.  Imaging: No results found. No images are attached to the encounter.  Labs: Lab Results  Component Value Date   HGBA1C 8.4 01/19/2010   LABURIC 5.9 07/08/2009   LABURIC 9.9 (H) 06/06/2009   REPTSTATUS 06/04/2010 FINAL 06/02/2010   CULT  06/02/2010    GROUP B STREP(S.AGALACTIAE)ISOLATED Note: TESTING AGAINST S. AGALACTIAE NOT ROUTINELY PERFORMED DUE TO PREDICTABILITY OF AMP/PEN/VAN SUSCEPTIBILITY.     Lab Results  Component Value Date   ALBUMIN 3.8 10/24/2017   ALBUMIN 3.0 (L) 09/16/2015   ALBUMIN 4.0 06/02/2010   LABURIC 5.9 07/08/2009   LABURIC 9.9 (H) 06/06/2009    Body mass index is 26.16 kg/m.  Orders:  No orders of the defined types were placed in this encounter.  No orders of the defined types were placed in this  encounter.    Procedures: No procedures performed  Clinical Data: No additional findings.  ROS:  All other systems negative, except as noted in the HPI. Review of Systems  Objective: Vital Signs: Ht 5\' 2"  (1.575 m)   Wt 143 lb (64.9 kg)   LMP 05/09/2010   BMI 26.16 kg/m   Specialty Comments:  No specialty comments available.  PMFS History: Patient Active Problem List   Diagnosis Date Noted  . Angioedema 09/13/2015  . Anaphylaxis   . Hypertension, essential   . Acute hypoxemic respiratory failure ([place])   . Type 2 diabetes mellitus without complication, without long-term current use of insulin ([place])   . DIAB W/O MENTION COMP TYPE II/UNS TYPE UNCNTRL 01/19/2010  . FOOT ULCER, RIGHT 01/19/2010  . MENOPAUSE-RELATED VASOMOTOR SYMPTOMS, HOT FLASHES 10/04/2009  . GOUT, UNSPECIFIED 06/07/2009  . LICHEN SIMPLEX CHRONICUS 11/30/2008  . OTITIS EXTERNA, CHRONIC 04/16/2008  . OBESITY, NOS 05/02/2006  . SCHIZOPHRENIA 05/02/2006  . HYPERTENSION, BENIGN SYSTEMIC 05/02/2006  . GASTROESOPHAGEAL REFLUX, NO ESOPHAGITIS 05/02/2006   Past Medical History:  Diagnosis Date  . Arthritis   . Chronic back pain   . Depression   . Diabetes mellitus without complication ([place])   . Gout   . Hypertension   . Schizophrenic disorder ([place])     History reviewed. No pertinent family history.  Past Surgical History:  Procedure Laterality Date  . BREAST BIOPSY Left    core biopsy  . CESAREAN SECTION     x2  . NASAL ENDOSCOPY Left 09/13/2015   Procedure: NASAL ENDOSCOPY WITH NASAL INTABATION;  Surgeon: Jerrell Belfast, MD;  Location: [facility] OR;  Service: ENT;  Laterality: Left;   Social History   Occupational History  . Not on file  Tobacco Use  . Smoking status: Current Every Day Smoker    Packs/day: 0.33    Types: Cigarettes  . Smokeless tobacco: Never Used  Substance and Sexual Activity  . Alcohol use: No  . Drug use: No  . Sexual activity: Not on file

## 2017-12-27 NOTE — Addendum Note (Signed)
Addended by: Meridee Score on: 12/27/2017 03:23 PM   Modules accepted: Orders

## 2018-01-02 ENCOUNTER — Other Ambulatory Visit (INDEPENDENT_AMBULATORY_CARE_PROVIDER_SITE_OTHER): Payer: Self-pay | Admitting: Orthopedic Surgery

## 2018-01-04 ENCOUNTER — Ambulatory Visit (HOSPITAL_COMMUNITY)
Admission: EM | Admit: 2018-01-04 | Discharge: 2018-01-04 | Disposition: A | Discharge status: Home / Self Care | Payer: Medicare HMO | Attending: Emergency Medicine | Admitting: Emergency Medicine

## 2018-01-04 ENCOUNTER — Encounter (HOSPITAL_COMMUNITY): Payer: Self-pay

## 2018-01-04 DIAGNOSIS — L03317 Cellulitis of buttock: Secondary | ICD-10-CM | POA: Diagnosis not present

## 2018-01-04 MED ORDER — AMOXICILLIN-POT CLAVULANATE 875-125 MG PO TABS: 1.0000 | ORAL_TABLET | Freq: Two times a day (BID) | ORAL | 0 refills | Status: AC

## 2018-01-04 NOTE — ED Triage Notes (Signed)
Pt presents with complaints of having a wound on her left thigh x 1 1/2 week.

## 2018-01-04 NOTE — Discharge Instructions (Signed)
Cleanse daily with soap and water.  Complete course of antibiotics.  Keep covered to keep clean.  Please follow up with your primary care provider for recheck of this.  Return to be seen if this worsens, develop increased pain or fevers

## 2018-01-05 NOTE — ED Provider Notes (Signed)
[place]    CSN: [id number] Arrival date & time: 01/04/18  1208     History   Chief Complaint Chief Complaint  Patient presents with  . Sore    HPI Jacqueline Reese is a 62 y.o. female.   Jacqueline Reese presents with complaints of open sore to her left posterior thigh/ lower buttock. Noticed it approximately 1.5 week ago. It is painful. It has been bleeding. Unknown if injury, trauma, bite etc prior to onset. No fevers. No gi/gu complaints. No leg pain, only localized pain.  Hasn't tried any treatment for symptoms. It hasn't been improving. Hx of gout, arthritis, DM, htn, schizophrenia.     ROS per HPI.      Past Medical History:  Diagnosis Date  . Arthritis   . Chronic back pain   . Depression   . Diabetes mellitus without complication ([place])   . Gout   . Hypertension   . Schizophrenic disorder [facility])     Patient Active Problem List   Diagnosis Date Noted  . Angioedema 09/13/2015  . Anaphylaxis   . Hypertension, essential   . Acute hypoxemic respiratory failure ([place])   . Type 2 diabetes mellitus without complication, without long-term current use of insulin ([place])   . DIAB W/O MENTION COMP TYPE II/UNS TYPE UNCNTRL 01/19/2010  . FOOT ULCER, RIGHT 01/19/2010  . MENOPAUSE-RELATED VASOMOTOR SYMPTOMS, HOT FLASHES 10/04/2009  . GOUT, UNSPECIFIED 06/07/2009  . LICHEN SIMPLEX CHRONICUS 11/30/2008  . OTITIS EXTERNA, CHRONIC 04/16/2008  . OBESITY, NOS 05/02/2006  . SCHIZOPHRENIA 05/02/2006  . HYPERTENSION, BENIGN SYSTEMIC 05/02/2006  . GASTROESOPHAGEAL REFLUX, NO ESOPHAGITIS 05/02/2006    Past Surgical History:  Procedure Laterality Date  . BREAST BIOPSY Left    core biopsy  . CESAREAN SECTION     x2  . NASAL ENDOSCOPY Left 09/13/2015   Procedure: NASAL ENDOSCOPY WITH NASAL INTABATION;  Surgeon: Jerrell Belfast, MD;  Location: [facility];  Service: ENT;  Laterality: Left;    OB History   None      Home Medications    Prior to Admission medications    Medication Sig Start Date End Date Taking? Authorizing Provider  acetaminophen (TYLENOL) 500 MG tablet Take 1,500 mg by mouth every 6 (six) hours as needed for mild pain.    [provider]  albuterol (PROVENTIL HFA;VENTOLIN HFA) 108 (90 BASE) MCG/ACT inhaler Inhale 2 puffs into the lungs every 4 (four) hours as needed for wheezing or shortness of breath. 05/09/13   Janne Napoleon, NP  allopurinol (ZYLOPRIM) 100 MG tablet Take 1 tablet (100 mg total) by mouth 2 (two) times daily. 08/28/17   Newt Minion, MD  amLODipine (NORVASC) 5 MG tablet Take 1 tablet (5 mg total) by mouth daily. 09/18/15   Whiteheart, Cristal Ford, NP  amoxicillin-clavulanate (AUGMENTIN) 875-125 MG tablet Take 1 tablet by mouth every 12 (twelve) hours for 7 days. 01/04/18 01/11/18  Zigmund Gottron, NP  aspirin EC 325 MG tablet Take 325 mg by mouth daily.    [provider]  clonazePAM (KLONOPIN) 1 MG tablet Take 1 mg by mouth 3 (three) times daily.     [provider]  famotidine (PEPCID) 40 MG tablet Take 1 tablet (40 mg total) by mouth daily. 09/18/15   Whiteheart, Cristal Ford, NP  furosemide (LASIX) 20 MG tablet Take 1 tablet (20 mg total) by mouth daily. 10/24/17   Langston Masker B, PA-C  gabapentin (NEURONTIN) 300 MG capsule Take 2 capsules (600 mg total) by  mouth 3 (three) times daily. 12/27/17   Newt Minion, MD  glipiZIDE (GLUCOTROL XL) 2.5 MG 24 hr tablet Take 2.5 mg by mouth daily.    [provider]  HYDROcodone-acetaminophen (NORCO) 10-325 MG tablet Take 1 tablet by mouth every 6 (six) hours as needed for pain. 10/16/17   [provider]  LINZESS 145 MCG CAPS capsule Take 145 mcg by mouth every 14 (fourteen) days. 09/14/17   [provider]  meloxicam (MOBIC) 7.5 MG tablet Take 1 tablet (7.5 mg total) by mouth daily. 12/23/16   Zigmund Gottron, NP  metFORMIN (GLUCOPHAGE) 500 MG tablet Take 500 mg by mouth 2 (two) times daily with a meal.    [provider]    nabumetone (RELAFEN) 750 MG tablet Take 1 tablet (750 mg total) by mouth 2 (two) times daily as needed for moderate pain. Patient taking differently: Take 750 mg by mouth daily.  05/03/17   Newt Minion, MD  naproxen (NAPROSYN) 375 MG tablet TAKE 1 TABLET(375 MG) BY MOUTH TWICE DAILY 01/02/18   Newt Minion, MD  potassium chloride SA (K-DUR,KLOR-CON) 20 MEQ tablet Take 1 tablet (20 mEq total) by mouth 2 (two) times daily for 3 days. 10/24/17 10/27/17  Langston Masker B, PA-C  predniSONE (DELTASONE) 10 MG tablet TAKE 2 TABLETS TWICE DAILY AS NEEDED 12/27/17   Newt Minion, MD  spironolactone (ALDACTONE) 50 MG tablet Take 50 mg by mouth daily.    [provider]  ziprasidone (GEODON) 80 MG capsule Take 160 mg by mouth every evening.  07/13/15   [provider]    Family History Family History  Problem Relation Age of Onset  . Diabetes Mother     Social History Social History   Tobacco Use  . Smoking status: Current Every Day Smoker    Packs/day: 0.33    Types: Cigarettes  . Smokeless tobacco: Never Used  Substance Use Topics  . Alcohol use: No  . Drug use: No     Allergies   Lisinopril; Chantix [varenicline]; Doxycycline; Ibuprofen; Lamisil [terbinafine]; Cephalexin; and Sulfa antibiotics   Review of Systems Review of Systems   Physical Exam Triage Vital Signs ED Triage Vitals  Enc Vitals Group     BP 01/04/18 1242 109/67     Pulse Rate 01/04/18 1242 80     Resp 01/04/18 1242 18     Temp 01/04/18 1242 98 F (36.7 C)     Temp src --      SpO2 01/04/18 1242 97 %     Weight --      Height --      Head Circumference --      Peak Flow --      Pain Score 01/04/18 1240 3     Pain Loc --      Pain Edu? --      Excl. in [place]? --    No data found.  Updated Vital Signs BP 109/67   Pulse 80   Temp 98 F (36.7 C)   Resp 18   LMP 05/09/2010   SpO2 97%    Physical Exam  Constitutional: She is oriented to person, place, and time. She appears  well-developed and well-nourished. No distress.  Cardiovascular: Normal rate, regular rhythm and normal heart sounds.  Pulmonary/Chest: Effort normal and breath sounds normal.  Neurological: She is alert and oriented to person, place, and time.  Skin: Skin is warm and dry.     Approximately 1  cm open lesion to distal left buttock/ proximal posterior thigh; moist but no active drainage; tender; mild surrounding redness, no fluctuance     UC Treatments / Results  Labs (all labs ordered are listed, but only abnormal results are displayed) Labs Reviewed - No data to display  EKG None  Radiology No results found.  Procedures Procedures (including critical care time)  Medications Ordered in UC Medications - No data to display  Initial Impression / Assessment and Plan / UC Course  I have reviewed the triage vital signs and the nursing notes.  Pertinent labs & imaging results that were available during my care of the patient were reviewed by me and considered in my medical decision making (see chart for details).     Open wound in presence of DM. Multiple allergies/intolerances to abx, augmentin provided. Return precautions provided. If symptoms worsen or do not improve in the next week to return to be seen or to follow up with PCP.  Patient verbalized understanding and agreeable to plan.   Final Clinical Impressions(s) / UC Diagnoses   Final diagnoses:  Cellulitis of buttock     Discharge Instructions     Cleanse daily with soap and water.  Complete course of antibiotics.  Keep covered to keep clean.  Please follow up with your primary care provider for recheck of this.  Return to be seen if this worsens, develop increased pain or fevers   ED Prescriptions    Medication Sig Dispense Auth. Provider   amoxicillin-clavulanate (AUGMENTIN) 875-125 MG tablet Take 1 tablet by mouth every 12 (twelve) hours for 7 days. 14 tablet Zigmund Gottron, NP     Controlled Substance  Prescriptions [place] Controlled Substance Registry consulted? Not Applicable   Zigmund Gottron, NP 01/05/18 [phone]

## 2018-01-28 ENCOUNTER — Other Ambulatory Visit (INDEPENDENT_AMBULATORY_CARE_PROVIDER_SITE_OTHER): Payer: Self-pay | Admitting: Orthopedic Surgery

## 2018-01-28 NOTE — Telephone Encounter (Signed)
Do you want to refill. Has had several refills.

## 2018-02-14 ENCOUNTER — Other Ambulatory Visit (INDEPENDENT_AMBULATORY_CARE_PROVIDER_SITE_OTHER): Payer: Self-pay | Admitting: Orthopedic Surgery

## 2018-02-17 NOTE — Telephone Encounter (Signed)
Pt had had filled several times do you wish to continue?

## 2018-02-17 NOTE — Telephone Encounter (Signed)
She has been on the prednisone for a while we should wean her off this.

## 2018-03-06 ENCOUNTER — Other Ambulatory Visit (INDEPENDENT_AMBULATORY_CARE_PROVIDER_SITE_OTHER): Payer: Self-pay

## 2018-03-06 ENCOUNTER — Other Ambulatory Visit (INDEPENDENT_AMBULATORY_CARE_PROVIDER_SITE_OTHER): Payer: Self-pay | Admitting: Orthopedic Surgery

## 2018-03-19 ENCOUNTER — Telehealth (INDEPENDENT_AMBULATORY_CARE_PROVIDER_SITE_OTHER): Payer: Self-pay

## 2018-03-19 ENCOUNTER — Other Ambulatory Visit (INDEPENDENT_AMBULATORY_CARE_PROVIDER_SITE_OTHER): Payer: Self-pay | Admitting: Orthopedic Surgery

## 2018-03-19 NOTE — Telephone Encounter (Signed)
Error

## 2018-03-19 NOTE — Telephone Encounter (Signed)
Sent you a message on this pt please advise.

## 2018-03-19 NOTE — Telephone Encounter (Addendum)
Patient would like a Rx refill on Naproxen, Nabumetone, and Prednisone. Patient would like for Nabumetone and Prednisone to be sent to [facility] and Naproxen sent to [facility] on [address]. Cb# is [phone].  Please advise.  Thank you.

## 2018-03-19 NOTE — Telephone Encounter (Signed)
Pt asks for these medications frequently she has been referred to pain management but this will take several weeks before we are notified if they will see the pt. Do you want to refill in the meanwhile?

## 2018-03-20 NOTE — Telephone Encounter (Signed)
rx sent for prednisone do not take NSAID with this

## 2018-03-21 ENCOUNTER — Telehealth (INDEPENDENT_AMBULATORY_CARE_PROVIDER_SITE_OTHER): Payer: Self-pay

## 2018-03-21 NOTE — Telephone Encounter (Signed)
I called pt and advised that Dr. Sharol Given refilled her prednisone. Has an appt on Thursday 03/27/18 can discuss other medications at that time.

## 2018-03-21 NOTE — Telephone Encounter (Signed)
Patient would like to get a RF on Naproxen.  Uses [facility] on [address].   CB [phone]

## 2018-03-21 NOTE — Telephone Encounter (Signed)
See previous message. Dr. Sharol Given approved the prednisone only and will discuss other medications at the time of her appt.

## 2018-03-27 ENCOUNTER — Ambulatory Visit (INDEPENDENT_AMBULATORY_CARE_PROVIDER_SITE_OTHER): Payer: Medicare HMO | Admitting: Orthopedic Surgery

## 2018-03-31 ENCOUNTER — Telehealth (INDEPENDENT_AMBULATORY_CARE_PROVIDER_SITE_OTHER): Payer: Self-pay | Admitting: Orthopedic Surgery

## 2018-03-31 NOTE — Telephone Encounter (Signed)
[name] from [facility] called needing clarification on Rx for Prednisone 10mg . Patient recently said she is allergic to Prednisone. [name] also said patient stated she was allergic to Prednisone 20mg  in 2016. The number to contact [name] is [phone]

## 2018-04-01 NOTE — Telephone Encounter (Signed)
I called patient and she stated that she needs her prednisone and that she is not allergic to it. She also stated that she will call [name] and tell him this. I will also call [name] and clarify the orders with [name] per Dr Sharol Given.

## 2018-04-01 NOTE — Telephone Encounter (Signed)
I called and spoke to [facility]) at [facility] and informed her that patient stated she wasn't allergic to this medication and also the pharmacist wanted to make sure should she refill.

## 2018-04-07 ENCOUNTER — Other Ambulatory Visit (INDEPENDENT_AMBULATORY_CARE_PROVIDER_SITE_OTHER): Payer: Self-pay | Admitting: Orthopedic Surgery

## 2018-04-08 NOTE — Telephone Encounter (Signed)
I think she requested and maybe got some prednisone, so cannot take the Naprosyn while on Prednisone.   She could resume once she completes her Prednisone, and would be okay to refill if she is not currently on prednisone.

## 2018-04-08 NOTE — Telephone Encounter (Signed)
Pt calling for refill. Is this ok?

## 2018-04-09 ENCOUNTER — Other Ambulatory Visit (INDEPENDENT_AMBULATORY_CARE_PROVIDER_SITE_OTHER): Payer: Self-pay | Admitting: Orthopedic Surgery

## 2018-04-17 NOTE — Telephone Encounter (Signed)
Please advise 

## 2018-04-23 ENCOUNTER — Other Ambulatory Visit (INDEPENDENT_AMBULATORY_CARE_PROVIDER_SITE_OTHER): Payer: Self-pay | Admitting: Orthopedic Surgery

## 2018-04-25 ENCOUNTER — Other Ambulatory Visit (INDEPENDENT_AMBULATORY_CARE_PROVIDER_SITE_OTHER): Payer: Self-pay | Admitting: Orthopaedic Surgery

## 2018-04-28 ENCOUNTER — Other Ambulatory Visit: Payer: Self-pay | Admitting: Internal Medicine

## 2018-04-28 DIAGNOSIS — Z1231 Encounter for screening mammogram for malignant neoplasm of breast: Secondary | ICD-10-CM

## 2018-04-28 NOTE — Telephone Encounter (Signed)
Please advise 

## 2018-05-05 ENCOUNTER — Encounter: Payer: Self-pay | Admitting: Physical Therapy

## 2018-05-05 ENCOUNTER — Ambulatory Visit: Payer: Medicare HMO | Attending: Internal Medicine | Admitting: Physical Therapy

## 2018-05-05 DIAGNOSIS — M6281 Muscle weakness (generalized): Secondary | ICD-10-CM | POA: Insufficient documentation

## 2018-05-05 DIAGNOSIS — G8929 Other chronic pain: Secondary | ICD-10-CM | POA: Insufficient documentation

## 2018-05-05 DIAGNOSIS — R262 Difficulty in walking, not elsewhere classified: Secondary | ICD-10-CM | POA: Diagnosis present

## 2018-05-05 DIAGNOSIS — M25562 Pain in left knee: Secondary | ICD-10-CM | POA: Diagnosis not present

## 2018-05-05 DIAGNOSIS — M25561 Pain in right knee: Secondary | ICD-10-CM | POA: Diagnosis present

## 2018-05-05 NOTE — Patient Instructions (Addendum)
Access Code: [email or web address]  URL: [email or web address]  Date: 05/05/2018  Prepared by: Elsie Ra   Exercises  Supine Hamstring Stretch with Strap - 3 sets - 30 hold - 2x daily - 6x weekly  Seated Hip Flexor Stretch - 3 sets - 30 hold - 2x daily - 6x weekly  Seated Long Arc Quad - 10 reps - 2-3 sets - 3 hold - 2x daily - 6x weekly  Standing Hip Abduction - 10 reps - 2-3 sets - 2x daily - 6x weekly  Standing Hip Extension - 10 reps - 2-3 sets - 2x daily - 6x weekly  Standing Knee Flexion - 10 reps - 3 sets - 2x daily - 6x weekly  Standing Marching - 10 reps - 1-3 sets - 2x daily - 6x weekly   TENS UNIT: This is helpful for muscle pain and spasm.   Search and Purchase a TENS 7000 2nd edition at www.tenspros.com. It should be less than $30.     TENS unit instructions: Do not shower or bathe with the unit on Turn the unit off before removing electrodes or batteries If the electrodes lose stickiness add a drop of water to the electrodes after they are disconnected from the unit and place on plastic sheet. If you continued to have difficulty, call the TENS unit company to purchase more electrodes. Do not apply lotion on the skin area prior to use. Make sure the skin is clean and dry as this will help prolong the life of the electrodes. After use, always check skin for unusual red areas, rash or other skin difficulties. If there are any skin problems, does not apply electrodes to the same area. Never remove the electrodes from the unit by pulling the wires. Do not use the TENS unit or electrodes other than as directed. Do not change electrode placement without consultating your therapist or physician. Keep 2 fingers with between each electrode. Wear time ratio is 2:1, on to off times.    For example on for 30 minutes off for 15 minutes and then on for 30 minutes off for 15 minutes

## 2018-05-05 NOTE — Therapy (Signed)
[place], [place], [address] Phone: [phone]   Fax:  [phone]  Physical Therapy Evaluation  Patient Details  Name: Jacqueline Reese MRN: [id number] Date of Birth: 12-07-1955 Referring Provider (PT): Nolene Ebbs, MD   Encounter Date: 05/05/2018  PT End of Session - 05/05/18 1239    Visit Number  1    Number of Visits  12    Date for PT Re-Evaluation  06/23/18    Authorization Type  Human MCR and MCR    PT Start Time  1015    PT Stop Time  1100    PT Time Calculation (min)  45 min    Activity Tolerance  Patient tolerated treatment well    Behavior During Therapy  [facility] for tasks assessed/performed       Past Medical History:  Diagnosis Date  . Arthritis   . Chronic back pain   . Depression   . Diabetes mellitus without complication ([place])   . Gout   . Hypertension   . Schizophrenic disorder [facility])     Past Surgical History:  Procedure Laterality Date  . BREAST BIOPSY Left    core biopsy  . CESAREAN SECTION     x2  . NASAL ENDOSCOPY Left 09/13/2015   Procedure: NASAL ENDOSCOPY WITH NASAL INTABATION;  Surgeon: Jerrell Belfast, MD;  Location: [facility];  Service: ENT;  Laterality: Left;    There were no vitals filed for this visit.   Subjective Assessment - 05/05/18 1039    Subjective  She relays chronic leg pain for last year that is now constant and in both legs. Pain worse when she first gets up in the morning or with prolonged positions.     How long can you sit comfortably?  not limited    How long can you stand comfortably?  5-10 min    How long can you walk comfortably?  5-10 min    Diagnostic tests  no recent imaging of knees but in 2018 show "subchondral sclerosis and several loose bodies" HIP XR show adavanced OA    Patient Stated Goals  walking longer and if possible without walker, less pain    Currently in Pain?  Yes    Pain Score  7     Pain Location  Leg    Pain  Orientation  Right;Left    Pain Descriptors / Indicators  Aching;Dull;Sharp    Pain Type  Chronic pain    Pain Radiating Towards  up/down Lt leg from hip to foot    Pain Onset  More than a month ago    Pain Frequency  Constant    Aggravating Factors   prolonged postions, standing and walking    Pain Relieving Factors  icyhot, meds, exercise    Effect of Pain on Daily Activities  limits everything    Multiple Pain Sites  No         [facility] PT Assessment - 05/05/18 0001      Assessment   Medical Diagnosis  bilat knee and leg pain, OA.    Referring Provider (PT)  Nolene Ebbs, MD    Onset Date/Surgical Date  --   Chronic knee pain for last year   Hand Dominance  Right    Next MD Visit  May 2020      Precautions   Precautions  None      Balance Screen   Has the patient fallen in the past 6 months  No   not in 6 months but has fallen in past   Has the patient had a decrease in activity level because of a fear of falling?   Yes    Is the patient reluctant to leave their home because of a fear of falling?   No      Home Environment   Living Environment  Private residence    Additional Comments  3 steps to get in, has to use rail to pull herself up      Prior Function   Level of Independence  Needs assistance with ADLs   needs assistance with houswork, and grocery store uses cart.   Vocation  On disability      Cognition   Overall Cognitive Status  Difficult to assess      Observation/Other Assessments   Focus on Therapeutic Outcomes (FOTO)   85% limited      Sensation   Light Touch  Appears Intact      Posture/Postural Control   Posture Comments  forward flexed posture      ROM / Strength   AROM / PROM / Strength  AROM;Strength      AROM   Overall AROM Comments  hip ROM WFL    AROM Assessment Site  Knee    Right/Left Knee  Right;Left    Right Knee Extension  0    Right Knee Flexion  110    Left Knee Extension  0    Left Knee Flexion  110      Strength    Overall Strength Comments  bilat knee and hip strength 4/5 MMT grossly tested in sitting      Flexibility   Soft Tissue Assessment /Muscle Length  --   tight H.S and quads, and hip flexors bilat     Palpation   Patella mobility  decreased    Palpation comment  TTP ant knee and thigh bilat      Transfers   Transfers  Independent with all Transfers    Comments  increased time needed      Ambulation/Gait   Gait Comments  very slow gait with forward flexed posture using rollator                Objective measurements completed on examination: See above findings.      [facility] Adult PT Treatment/Exercise - 05/05/18 0001      Modalities   Modalities  Moist Heat;Electrical Stimulation      Moist Heat Therapy   Number Minutes Moist Heat  10 Minutes    Moist Heat Location  Knee      Electrical Stimulation   Electrical Stimulation Location  bilat knees    Electrical Stimulation Action  Pre mod    Electrical Stimulation Parameters  tolerance    Electrical Stimulation Goals  Pain             PT Education - 05/05/18 1238    Education Details  HEP, POC, TENS    Person(s) Educated  Patient    Methods  Explanation;Demonstration;Verbal cues;Handout    Comprehension  Verbalized understanding;Need further instruction          PT Long Term Goals - 05/05/18 1245      PT LONG TERM GOAL #1   Title  Pt will be I and compliant with HEP. (6 weeks 06/23/18)    Status  New      PT LONG TERM GOAL #2   Title  Pt will improve hip/knee  strength to 4+/5 MMT grossly to improve function. (6 weeks 06/23/18)    Status  New      PT LONG TERM GOAL #3   Title  Pt will improve FOTO to less than 60% limited. (6 weeks 06/23/18)    Status  New      PT LONG TERM GOAL #4   Title  Pt will be able to ambulate community distance at least 400 ft with LRAD and less than 5/10 pain. (6 weeks 06/23/18)    Status  New             Plan - 05/05/18 1240    Clinical Impression Statement  Pt  presents with bilat hip, thigh, and knee pain with OA. She has decreased activity tolerance, decreased strength, decreased ROM, difficulty walking or standing more than 5-10 minutes, and increased pain limiting her function. She will benefit from skilled PT to address her defecits.     Personal Factors and Comorbidities  Comorbidity 1;Comorbidity 2;Comorbidity 3+;Past/Current Experience;Transportation    Comorbidities  PMH: Vit D deficient, chronic knee and back pain, advanced bilat hip OA, bipolar and dep,,DM,HTN    Examination-Activity Limitations  Bathing;Hygiene/Grooming;Squat;Lift;Stairs;Stand;Locomotion Level;Bend;Caring for Others;Reach Overhead;Transfers;Sleep    Examination-Participation Restrictions  Church;Laundry;Shop;Cleaning;Medication Management;Meal Prep;Community Activity;Driving;Personal Finances    Stability/Clinical Decision Making  Unstable/Unpredictable    Clinical Decision Making  High    Rehab Potential  Fair    PT Frequency  2x / week    PT Duration  6 weeks    PT Treatment/Interventions  ADLs/Self Care Home Management;Cryotherapy;Electrical Stimulation;Iontophoresis 4mg /ml Dexamethasone;Moist Heat;Ultrasound;Gait training;Stair training;Functional mobility training;Therapeutic activities;Balance training;Therapeutic exercise;Neuromuscular re-education;Manual techniques;Passive range of motion;Dry needling;Energy conservation;Taping;Joint Manipulations    PT Next Visit Plan  review HEP, begin hip knee stretching and strengthening, consider TUG or 5TSTS or BERG test, consider modalaties for pain    PT Home Exercise Plan  LAQ, quad/hip flexor stretch, HSS, standing H.S curl, hip abd, hip ext, marching    Consulted and Agree with Plan of Care  Patient       Patient will benefit from skilled therapeutic intervention in order to improve the following deficits and impairments:  Abnormal gait, Decreased activity tolerance, Decreased endurance, Decreased range of motion, Decreased  strength, Hypomobility, Decreased balance, Difficulty walking, Obesity, Postural dysfunction, Pain  Visit Diagnosis: Chronic pain of left knee  Chronic pain of right knee  Muscle weakness (generalized)  Difficulty in walking, not elsewhere classified     Problem List Patient Active Problem List   Diagnosis Date Noted  . Angioedema 09/13/2015  . Anaphylaxis   . Hypertension, essential   . Acute hypoxemic respiratory failure ([place])   . Type 2 diabetes mellitus without complication, without long-term current use of insulin ([place])   . DIAB W/O MENTION COMP TYPE II/UNS TYPE UNCNTRL 01/19/2010  . FOOT ULCER, RIGHT 01/19/2010  . MENOPAUSE-RELATED VASOMOTOR SYMPTOMS, HOT FLASHES 10/04/2009  . GOUT, UNSPECIFIED 06/07/2009  . LICHEN SIMPLEX CHRONICUS 11/30/2008  . OTITIS EXTERNA, CHRONIC 04/16/2008  . OBESITY, NOS 05/02/2006  . SCHIZOPHRENIA 05/02/2006  . HYPERTENSION, BENIGN SYSTEMIC 05/02/2006  . GASTROESOPHAGEAL REFLUX, NO ESOPHAGITIS 05/02/2006    Silvestre Mesi 05/05/2018, 12:48 PM  [facility] [address] [place], [place], [address] Phone: [phone]   Fax:  [phone]  Name: Jacqueline Reese MRN: [id number] Date of Birth: 1955-03-22

## 2018-05-09 ENCOUNTER — Other Ambulatory Visit (INDEPENDENT_AMBULATORY_CARE_PROVIDER_SITE_OTHER): Payer: Self-pay | Admitting: Orthopaedic Surgery

## 2018-05-12 ENCOUNTER — Telehealth (INDEPENDENT_AMBULATORY_CARE_PROVIDER_SITE_OTHER): Payer: Self-pay | Admitting: Orthopedic Surgery

## 2018-05-12 ENCOUNTER — Ambulatory Visit: Payer: Medicare HMO | Admitting: Physical Therapy

## 2018-05-12 DIAGNOSIS — M6281 Muscle weakness (generalized): Secondary | ICD-10-CM

## 2018-05-12 DIAGNOSIS — M25562 Pain in left knee: Secondary | ICD-10-CM | POA: Diagnosis not present

## 2018-05-12 DIAGNOSIS — G8929 Other chronic pain: Secondary | ICD-10-CM

## 2018-05-12 DIAGNOSIS — M25561 Pain in right knee: Secondary | ICD-10-CM

## 2018-05-12 DIAGNOSIS — R262 Difficulty in walking, not elsewhere classified: Secondary | ICD-10-CM

## 2018-05-12 NOTE — Telephone Encounter (Signed)
[place] patient °

## 2018-05-12 NOTE — Therapy (Signed)
[place], [place], [address] Phone: [phone]   Fax:  [phone]  Physical Therapy Treatment  Patient Details  Name: Jacqueline Reese MRN: [id number] Date of Birth: [birth year] Referring Provider (PT): Nolene Ebbs, MD   Encounter Date: 05/12/2018  PT End of Session - 05/12/18 1013    Visit Number  2    Number of Visits  12    Date for PT Re-Evaluation  06/23/18    Authorization Type  Human MCR and MCD    PT Start Time  0930    PT Stop Time  1016    PT Time Calculation (min)  46 min    Activity Tolerance  Patient tolerated treatment well    Behavior During Therapy  [facility] for tasks assessed/performed       Past Medical History:  Diagnosis Date  . Arthritis   . Chronic back pain   . Depression   . Diabetes mellitus without complication ([place])   . Gout   . Hypertension   . Schizophrenic disorder [facility])     Past Surgical History:  Procedure Laterality Date  . BREAST BIOPSY Left    core biopsy  . CESAREAN SECTION     x2  . NASAL ENDOSCOPY Left 09/13/2015   Procedure: NASAL ENDOSCOPY WITH NASAL INTABATION;  Surgeon: Jerrell Belfast, MD;  Location: [place];  Service: ENT;  Laterality: Left;    There were no vitals filed for this visit.  Subjective Assessment - 05/12/18 0942    Subjective  she relays some soreness in her knees, no pain in Lt knee today but pain in her Rt knee posterolateral    Pertinent History  PMH: Vit D deficient, chronic knee and back pain, advanced bilat hip OA, bipolar and dep,,DM,HTN    Currently in Pain?  Yes    Pain Score  7     Pain Location  Knee    Pain Orientation  Right    Pain Descriptors / Indicators  Aching    Pain Type  Chronic pain    Pain Onset  More than a month ago    Pain Frequency  Constant         [facility] PT Assessment - 05/12/18 0001      Assessment   Medical Diagnosis  bilat knee and leg pain, OA.    Referring Provider (PT)  Nolene Ebbs,  MD      Standardized Balance Assessment   Standardized Balance Assessment  Timed Up and Go Test;Five Times Sit to Stand    Five times sit to stand comments   13 sec      Timed Up and Go Test   Normal TUG (seconds)  20   with rollator                  [facility] Adult PT Treatment/Exercise - 05/12/18 0001      Exercises   Exercises  Knee/Hip      Knee/Hip Exercises: Stretches   Passive Hamstring Stretch Limitations  manual 30 sec X 3 ea      Knee/Hip Exercises: Aerobic   Nustep  L1 LE/UE for 5 min      Knee/Hip Exercises: Seated   Long Arc Quad  Both;15 reps      Knee/Hip Exercises: Supine   Short Arc Quad Sets  Both;15 reps    Hip Adduction Isometric Limitations  5 sec X 15    Other Supine Knee/Hip Exercises  clams with  red X 20      Modalities   Modalities  Moist Heat;Electrical Stimulation      Moist Heat Therapy   Number Minutes Moist Heat  10 Minutes    Moist Heat Location  Knee      Electrical Stimulation   Electrical Stimulation Location  Rt knee    Electrical Stimulation Action  IFC    Electrical Stimulation Parameters  tolerance    Electrical Stimulation Goals  Pain      Manual Therapy   Manual therapy comments  STM, PROM, anterior knee mobs at 90 for distraction and pumping nervous system                  PT Long Term Goals - 05/12/18 1036      PT LONG TERM GOAL #1   Title  Pt will be I and compliant with HEP. (6 weeks 06/23/18)    Status  On-going      PT LONG TERM GOAL #2   Title  Pt will improve hip/knee strength to 4+/5 MMT grossly to improve function. (6 weeks 06/23/18)    Status  On-going      PT LONG TERM GOAL #3   Title  Pt will improve FOTO to less than 60% limited. (6 weeks 06/23/18)    Status  On-going      PT LONG TERM GOAL #4   Title  Pt will be able to ambulate community distance at least 400 ft with LRAD and less than 5/10 pain. (6 weeks 06/23/18)    Status  On-going      PT LONG TERM GOAL #5   Title  New goal:  Pt will improve TUG to less than 14 sec  with LRAD to show improved gait speed and dynamic balance. 6 weeks    Baseline  20    Status  New            Plan - 05/12/18 1014    Clinical Impression Statement  Session focused on testing 5TSTS and TUG test to establish dynamic balance and endurance today. She had fair 5TSTS test but had poor TUG test showing slow gait and stabillity with gait but she is also limited by knee pain. Seession then focused on knee ROM and strengthening, and manual therapy followed by TENS and heat for pain control. PT will continue to imporve function and decrease pain as ablle    Personal Factors and Comorbidities  Comorbidity 1;Comorbidity 2;Comorbidity 3+;Past/Current Experience;Transportation    Comorbidities  PMH: Vit D deficient, chronic knee and back pain, advanced bilat hip OA, bipolar and dep,,DM,HTN    Stability/Clinical Decision Making  Unstable/Unpredictable    Rehab Potential  Fair    PT Frequency  2x / week    PT Duration  6 weeks    PT Treatment/Interventions  ADLs/Self Care Home Management;Cryotherapy;Electrical Stimulation;Iontophoresis 4mg /ml Dexamethasone;Moist Heat;Ultrasound;Gait training;Stair training;Functional mobility training;Therapeutic activities;Balance training;Therapeutic exercise;Neuromuscular re-education;Manual techniques;Passive range of motion;Dry needling;Energy conservation;Taping;Joint Manipulations    PT Next Visit Plan  review HEP, begin hip knee stretching and strengthening, consider TUG or 5TSTS or BERG test, consider modalaties for pain    PT Home Exercise Plan  LAQ, quad/hip flexor stretch, HSS, standing H.S curl, hip abd, hip ext, marching    Consulted and Agree with Plan of Care  Patient       Patient will benefit from skilled therapeutic intervention in order to improve the following deficits and impairments:  Abnormal gait, Decreased activity tolerance, Decreased endurance, Decreased range  of motion, Decreased  strength, Hypomobility, Decreased balance, Difficulty walking, Obesity, Postural dysfunction, Pain  Visit Diagnosis: Chronic pain of left knee  Chronic pain of right knee  Muscle weakness (generalized)  Difficulty in walking, not elsewhere classified     Problem List Patient Active Problem List   Diagnosis Date Noted  . Angioedema 09/13/2015  . Anaphylaxis   . Hypertension, essential   . Acute hypoxemic respiratory failure ([place])   . Type 2 diabetes mellitus without complication, without long-term current use of insulin ([place])   . DIAB W/O MENTION COMP TYPE II/UNS TYPE UNCNTRL 01/19/2010  . FOOT ULCER, RIGHT 01/19/2010  . MENOPAUSE-RELATED VASOMOTOR SYMPTOMS, HOT FLASHES 10/04/2009  . GOUT, UNSPECIFIED 06/07/2009  . LICHEN SIMPLEX CHRONICUS 11/30/2008  . OTITIS EXTERNA, CHRONIC 04/16/2008  . OBESITY, NOS 05/02/2006  . SCHIZOPHRENIA 05/02/2006  . HYPERTENSION, BENIGN SYSTEMIC 05/02/2006  . GASTROESOPHAGEAL REFLUX, NO ESOPHAGITIS 05/02/2006    Silvestre Mesi 05/12/2018, 10:38 AM  [facility] [address] [place], [place], [address] Phone: [phone]   Fax:  [phone]  Name: Jacqueline Reese MRN: [id number] Date of Birth: January 07, 1956

## 2018-05-12 NOTE — Telephone Encounter (Signed)
Autumn - Can you please call this pt and make an appt for medication management. I have sent a few messages on this pt to make appts over the past few weeks. Please hold. If she does not answer and you have to leave a message please hold message pending a return call or try to reach out to pt again please.   I called pt to advise per Autumn.  Pt stated she is doing PT for 8 weeks. And she is currently watching her mom. She will call back to make a follow up apt.  Although she did request for some more prednisone.

## 2018-05-12 NOTE — Telephone Encounter (Signed)
Please advise 

## 2018-05-12 NOTE — Telephone Encounter (Signed)
Needs to be filled by someone else.  Not my regular patient. ?Sharol Given

## 2018-05-12 NOTE — Telephone Encounter (Signed)
Pt was advised needs office visit to discuss medications. She states that she is currently taking care of her mother and going to physical therapy and does not want to come in until she has completed this. Asking for a refill of prednisone. Do you wish to fill this?

## 2018-05-13 ENCOUNTER — Other Ambulatory Visit (INDEPENDENT_AMBULATORY_CARE_PROVIDER_SITE_OTHER): Payer: Self-pay

## 2018-05-13 ENCOUNTER — Other Ambulatory Visit (INDEPENDENT_AMBULATORY_CARE_PROVIDER_SITE_OTHER): Payer: Self-pay | Admitting: Orthopedic Surgery

## 2018-05-13 ENCOUNTER — Telehealth (INDEPENDENT_AMBULATORY_CARE_PROVIDER_SITE_OTHER): Payer: Self-pay | Admitting: Orthopedic Surgery

## 2018-05-13 MED ORDER — PREDNISONE 10 MG PO TABS: 10.0000 mg | ORAL_TABLET | Freq: Every day | ORAL | 0 refills | Status: DC

## 2018-05-13 NOTE — Telephone Encounter (Signed)
I called pt to advise that Dr. Sharol Given had sent the rx to the pharmacy for her. Pt requests that this be sent to  [facility]. resubmit the rx for prednisone as requested and will call with any questions.

## 2018-05-13 NOTE — Telephone Encounter (Signed)
Patient called left voicemail message needing Rx refilled (Prednisone) Patient said she took brace off of her arm and will schedule an appointment as soon as she can. Patient said her mom has been sick and in the hospital and she have to take care of her mom. The number to contact patient is [phone]

## 2018-05-13 NOTE — Telephone Encounter (Signed)
Pt is aware.  

## 2018-05-13 NOTE — Telephone Encounter (Signed)
rx for prednisone sent to pharmacy

## 2018-05-15 ENCOUNTER — Encounter: Payer: Self-pay | Admitting: Physical Therapy

## 2018-05-15 ENCOUNTER — Ambulatory Visit: Payer: Medicare HMO | Admitting: Physical Therapy

## 2018-05-15 ENCOUNTER — Other Ambulatory Visit: Payer: Self-pay

## 2018-05-15 DIAGNOSIS — M25562 Pain in left knee: Secondary | ICD-10-CM | POA: Diagnosis not present

## 2018-05-15 DIAGNOSIS — M25561 Pain in right knee: Secondary | ICD-10-CM

## 2018-05-15 DIAGNOSIS — G8929 Other chronic pain: Secondary | ICD-10-CM

## 2018-05-15 DIAGNOSIS — R262 Difficulty in walking, not elsewhere classified: Secondary | ICD-10-CM

## 2018-05-15 DIAGNOSIS — M6281 Muscle weakness (generalized): Secondary | ICD-10-CM

## 2018-05-15 NOTE — Therapy (Signed)
[place], [place], [address] Phone: [phone]   Fax:  [phone]  Physical Therapy Treatment  Patient Details  Name: Jacqueline Reese MRN: [id number] Date of Birth: 03-30-1955 Referring Provider (PT): Nolene Ebbs, MD    Encounter Date: 05/15/2018  PT End of Session - 05/15/18 1327    Visit Number  3    Number of Visits  12    Date for PT Re-Evaluation  06/23/18    Authorization Type  Human MCR and MCD    PT Start Time  8032    PT Stop Time  1224   heat at the end of tx   PT Time Calculation (min)  51 min    Activity Tolerance  Patient limited by pain    Behavior During Therapy  [facility] for tasks assessed/performed       Past Medical History:  Diagnosis Date  . Arthritis   . Chronic back pain   . Depression   . Diabetes mellitus without complication ([place])   . Gout   . Hypertension   . Schizophrenic disorder [facility])     Past Surgical History:  Procedure Laterality Date  . BREAST BIOPSY Left    core biopsy  . CESAREAN SECTION     x2  . NASAL ENDOSCOPY Left 09/13/2015   Procedure: NASAL ENDOSCOPY WITH NASAL INTABATION;  Surgeon: Jerrell Belfast, MD;  Location: [place];  Service: ENT;  Laterality: Left;    There were no vitals filed for this visit.  Subjective Assessment - 05/15/18 1331    Subjective  Pt reports she is feeling a little better, some achiness in the morning    Patient Stated Goals  walking longer and if possible without walker, less pain    Currently in Pain?  Yes    Pain Score  5     Pain Location  Knee    Pain Orientation  Right    Pain Descriptors / Indicators  Aching    Pain Type  Chronic pain                       [facility] Adult PT Treatment/Exercise - 05/15/18 0001      Exercises   Exercises  Knee/Hip      Knee/Hip Exercises: Aerobic   Nustep  L2 LE/UE for 5 min      Knee/Hip Exercises: Machines for Strengthening   Total Gym Leg Press  1 plate,  feet on high plate for 10, on low plate for 10      Knee/Hip Exercises: Standing   Hip Extension  Stengthening;Both;10 reps;Knee bent;Knee straight      Knee/Hip Exercises: Seated   Long Arc Quad  Both;15 reps;Weights    Long Arc Quad Weight  5 lbs.      Knee/Hip Exercises: Supine   Bridges  Strengthening;Both;2 sets;15 reps      Modalities   Modalities  Moist Heat      Moist Heat Therapy   Number Minutes Moist Heat  15 Minutes    Moist Heat Location  Other (comment)   bilat quads/knees/anterior hips     Manual Therapy   Manual therapy comments  Long leg distraction with pumping for the hip to increase fluid/decrease inflammation,followed by sidelying hip stretch into exetnsion all LT side -                   PT Long Term Goals - 05/12/18 1036  PT LONG TERM GOAL #1   Title  Pt will be I and compliant with HEP. (6 weeks 06/23/18)    Status  On-going      PT LONG TERM GOAL #2   Title  Pt will improve hip/knee strength to 4+/5 MMT grossly to improve function. (6 weeks 06/23/18)    Status  On-going      PT LONG TERM GOAL #3   Title  Pt will improve FOTO to less than 60% limited. (6 weeks 06/23/18)    Status  On-going      PT LONG TERM GOAL #4   Title  Pt will be able to ambulate community distance at least 400 ft with LRAD and less than 5/10 pain. (6 weeks 06/23/18)    Status  On-going      PT LONG TERM GOAL #5   Title  New goal: Pt will improve TUG to less than 14 sec  with LRAD to show improved gait speed and dynamic balance. 6 weeks    Baseline  20    Status  New            Plan - 05/15/18 1404    Clinical Impression Statement  Pt with a lot of tightness in bilat hip flexors and quads leading to FWD flexed posturing in standing and inability to straighten legs in supine.  Was able to get good manual work/stretch to Lt hip.  she was then to sore to have the Rt hip worked on.  Heat settled her pain down at end of session.     Rehab Potential  Fair     PT Frequency  2x / week    PT Duration  6 weeks    PT Treatment/Interventions  ADLs/Self Care Home Management;Cryotherapy;Electrical Stimulation;Iontophoresis 4mg /ml Dexamethasone;Moist Heat;Ultrasound;Gait training;Stair training;Functional mobility training;Therapeutic activities;Balance training;Therapeutic exercise;Neuromuscular re-education;Manual techniques;Passive range of motion;Dry needling;Energy conservation;Taping;Joint Manipulations    PT Next Visit Plan  review HEP, begin hip knee stretching and strengthening, consider modalaties for pain    Consulted and Agree with Plan of Care  Patient       Patient will benefit from skilled therapeutic intervention in order to improve the following deficits and impairments:  Abnormal gait, Decreased activity tolerance, Decreased endurance, Decreased range of motion, Decreased strength, Hypomobility, Decreased balance, Difficulty walking, Obesity, Postural dysfunction, Pain  Visit Diagnosis: Chronic pain of left knee  Chronic pain of right knee  Muscle weakness (generalized)  Difficulty in walking, not elsewhere classified     Problem List Patient Active Problem List   Diagnosis Date Noted  . Angioedema 09/13/2015  . Anaphylaxis   . Hypertension, essential   . Acute hypoxemic respiratory failure ([place])   . Type 2 diabetes mellitus without complication, without long-term current use of insulin ([place])   . DIAB W/O MENTION COMP TYPE II/UNS TYPE UNCNTRL 01/19/2010  . FOOT ULCER, RIGHT 01/19/2010  . MENOPAUSE-RELATED VASOMOTOR SYMPTOMS, HOT FLASHES 10/04/2009  . GOUT, UNSPECIFIED 06/07/2009  . LICHEN SIMPLEX CHRONICUS 11/30/2008  . OTITIS EXTERNA, CHRONIC 04/16/2008  . OBESITY, NOS 05/02/2006  . SCHIZOPHRENIA 05/02/2006  . HYPERTENSION, BENIGN SYSTEMIC 05/02/2006  . GASTROESOPHAGEAL REFLUX, NO ESOPHAGITIS 05/02/2006    Jeral Pinch PT  05/15/2018, 2:06 PM  [facility] [address] [place], [place], [address] Phone: [phone]   Fax:  [phone]  Name: Jacqueline Reese MRN: [id number] Date of Birth: 10-06-1955

## 2018-05-20 ENCOUNTER — Encounter: Payer: Self-pay | Admitting: Physical Therapy

## 2018-05-20 ENCOUNTER — Ambulatory Visit: Payer: Medicare HMO | Admitting: Physical Therapy

## 2018-05-20 ENCOUNTER — Other Ambulatory Visit: Payer: Self-pay

## 2018-05-20 DIAGNOSIS — R262 Difficulty in walking, not elsewhere classified: Secondary | ICD-10-CM

## 2018-05-20 DIAGNOSIS — M25562 Pain in left knee: Principal | ICD-10-CM

## 2018-05-20 DIAGNOSIS — M25561 Pain in right knee: Secondary | ICD-10-CM

## 2018-05-20 DIAGNOSIS — M6281 Muscle weakness (generalized): Secondary | ICD-10-CM

## 2018-05-20 DIAGNOSIS — G8929 Other chronic pain: Secondary | ICD-10-CM

## 2018-05-20 NOTE — Therapy (Signed)
[place], [place], [address] Phone: [phone]   Fax:  [phone]  Physical Therapy Treatment  Patient Details  Name: Jacqueline Reese MRN: [id number] Date of Birth: 1956-02-21 Referring Provider (PT): Nolene Ebbs, MD   Encounter Date: 05/20/2018  PT End of Session - 05/20/18 8841    Visit Number  4    Number of Visits  12    Date for PT Re-Evaluation  06/23/18    Authorization Type  Human MCR and MCD    PT Start Time  6606    PT Stop Time  1359    PT Time Calculation (min)  42 min    Activity Tolerance  Patient tolerated treatment well    Behavior During Therapy  [facility] for tasks assessed/performed       Past Medical History:  Diagnosis Date  . Arthritis   . Chronic back pain   . Depression   . Diabetes mellitus without complication ([place])   . Gout   . Hypertension   . Schizophrenic disorder [facility])     Past Surgical History:  Procedure Laterality Date  . BREAST BIOPSY Left    core biopsy  . CESAREAN SECTION     x2  . NASAL ENDOSCOPY Left 09/13/2015   Procedure: NASAL ENDOSCOPY WITH NASAL INTABATION;  Surgeon: Jerrell Belfast, MD;  Location: [place];  Service: ENT;  Laterality: Left;    There were no vitals filed for this visit.  Subjective Assessment - 05/20/18 1318    Subjective  "I am having more pain inthe knees today"    Patient Stated Goals  walking longer and if possible without walker, less pain    Currently in Pain?  Yes    Pain Score  7     Pain Location  Knee    Pain Orientation  Right    Pain Descriptors / Indicators  Aching;Sore    Pain Type  Chronic pain    Pain Onset  More than a month ago    Pain Frequency  Intermittent    Aggravating Factors   prolonged standing/ walking    Pain Relieving Factors  icyhot, meds ,exercise    Effect of Pain on Daily Activities  limits                       [facility] Adult PT Treatment/Exercise - 05/20/18 1325      Transfers   Transfers  Supine to Sit;Sit to Stand    Sit to Stand  4: Min guard;From elevated surface   Verbal cue for UE push from mat instead of pull on AD   Five time sit to stand comments   --    Supine to Sit  4: Min assist    Supine to Sit Details (indicate cue type and reason)  Tactile Cue     Supine to Sit Details  Other (comment)   Hand to Hand Assist      Ambulation/Gait   Ambulation/Gait  Yes    Ambulation/Gait Assistance  6: Modified independent (Device/Increase time)    Ambulation Distance (Feet)  85 Feet    Assistive device  4-wheeled walker    Gait Pattern  Step-through pattern    Ambulation Surface  Level      Knee/Hip Exercises: Aerobic   Nustep  L4 x 5 min LE only      Knee/Hip Exercises: Seated   Long Arc Quad  Both;Weights;20 reps;2 sets;15 reps  Long Arc Quad Weight  5 lbs.    Marching  Strengthening;Both;Weights;2 sets;10 reps    Marching Limitations  --    Marching Weights  5 lbs.    Sit to [facility]  with UE support   Functional training      Manual Therapy   Manual Therapy  Joint mobilization    Joint Mobilization  Long Axis Distraction Grade 2-3 oscillations for 5 min              PT Education - 05/20/18 1351    Education Details  Sit to Stand transfer and Exercise     Person(s) Educated  Patient    Methods  Explanation;Tactile cues;Verbal cues    Comprehension  Verbalized understanding;Returned demonstration          PT Long Term Goals - 05/12/18 1036      PT LONG TERM GOAL #1   Title  Pt will be I and compliant with HEP. (6 weeks 06/23/18)    Status  On-going      PT LONG TERM GOAL #2   Title  Pt will improve hip/knee strength to 4+/5 MMT grossly to improve function. (6 weeks 06/23/18)    Status  On-going      PT LONG TERM GOAL #3   Title  Pt will improve FOTO to less than 60% limited. (6 weeks 06/23/18)    Status  On-going      PT LONG TERM GOAL #4   Title  Pt will be able to ambulate community distance at least 400 ft with  LRAD and less than 5/10 pain. (6 weeks 06/23/18)    Status  On-going      PT LONG TERM GOAL #5   Title  New goal: Pt will improve TUG to less than 14 sec  with LRAD to show improved gait speed and dynamic balance. 6 weeks    Baseline  20    Status  New            Plan - 05/20/18 1400    Clinical Impression Statement  Patient with continued tightness in hip flexors. With verbal cuing is able to walk with 4-wheeled walker slowly in an upright step-thorugh gait pattern. She shows continued weakness in bilateral LE and core through with mild difficulty with bed mobility. Was able to tolerate all treatments without increase of symptom severity.     Personal Factors and Comorbidities  Comorbidity 1;Comorbidity 2;Comorbidity 3+;Past/Current Experience;Transportation    Comorbidities  PMH: Vit D deficient, chronic knee and back pain, advanced bilat hip OA, bipolar and dep,,DM,HTN    Examination-Activity Limitations  Bathing;Hygiene/Grooming;Squat;Lift;Stairs;Stand;Locomotion Level;Bend;Caring for Others;Reach Overhead;Transfers;Sleep    Examination-Participation Restrictions  Church;Laundry;Shop;Cleaning;Medication Management;Meal Prep;Community Activity;Driving;Personal Finances    Stability/Clinical Decision Making  Unstable/Unpredictable    Rehab Potential  Fair    PT Frequency  2x / week    PT Duration  6 weeks    PT Treatment/Interventions  ADLs/Self Care Home Management;Cryotherapy;Electrical Stimulation;Iontophoresis 4mg /ml Dexamethasone;Moist Heat;Ultrasound;Gait training;Stair training;Functional mobility training;Therapeutic activities;Balance training;Therapeutic exercise;Neuromuscular re-education;Manual techniques;Passive range of motion;Dry needling;Energy conservation;Taping;Joint Manipulations    PT Next Visit Plan  review HEP, begin hip knee stretching and strengthening, consider modalaties for pain    PT Home Exercise Plan  LAQ, quad/hip flexor stretch, HSS, standing H.S curl, hip  abd, hip ext, marching    Consulted and Agree with Plan of Care  Patient       Patient will benefit from skilled therapeutic intervention in order to improve the following deficits and  impairments:  Abnormal gait, Decreased activity tolerance, Decreased endurance, Decreased range of motion, Decreased strength, Hypomobility, Decreased balance, Difficulty walking, Obesity, Postural dysfunction, Pain  Visit Diagnosis: Chronic pain of left knee  Chronic pain of right knee  Muscle weakness (generalized)  Difficulty in walking, not elsewhere classified     Problem List Patient Active Problem List   Diagnosis Date Noted  . Angioedema 09/13/2015  . Anaphylaxis   . Hypertension, essential   . Acute hypoxemic respiratory failure ([place])   . Type 2 diabetes mellitus without complication, without long-term current use of insulin ([place])   . DIAB W/O MENTION COMP TYPE II/UNS TYPE UNCNTRL 01/19/2010  . FOOT ULCER, RIGHT 01/19/2010  . MENOPAUSE-RELATED VASOMOTOR SYMPTOMS, HOT FLASHES 10/04/2009  . GOUT, UNSPECIFIED 06/07/2009  . LICHEN SIMPLEX CHRONICUS 11/30/2008  . OTITIS EXTERNA, CHRONIC 04/16/2008  . OBESITY, NOS 05/02/2006  . SCHIZOPHRENIA 05/02/2006  . HYPERTENSION, BENIGN SYSTEMIC 05/02/2006  . GASTROESOPHAGEAL REFLUX, NO ESOPHAGITIS 05/02/2006   Starr Lake PT, DPT, LAT, ATC  05/20/18  2:08 PM      [facility] [name] [place], [place], [address] Phone: [phone]   Fax:  [phone]  Name: Jacqueline Reese MRN: [id number] Date of Birth: 08/22/1955

## 2018-05-22 ENCOUNTER — Other Ambulatory Visit: Payer: Self-pay

## 2018-05-22 ENCOUNTER — Encounter: Payer: Self-pay | Admitting: Physical Therapy

## 2018-05-22 ENCOUNTER — Ambulatory Visit: Payer: Medicare HMO | Admitting: Physical Therapy

## 2018-05-22 DIAGNOSIS — M25561 Pain in right knee: Secondary | ICD-10-CM

## 2018-05-22 DIAGNOSIS — R262 Difficulty in walking, not elsewhere classified: Secondary | ICD-10-CM

## 2018-05-22 DIAGNOSIS — G8929 Other chronic pain: Secondary | ICD-10-CM

## 2018-05-22 DIAGNOSIS — M6281 Muscle weakness (generalized): Secondary | ICD-10-CM

## 2018-05-22 DIAGNOSIS — M25562 Pain in left knee: Principal | ICD-10-CM

## 2018-05-22 NOTE — Therapy (Addendum)
[place], [place], [address] Phone: [phone]   Fax:  [phone]  Physical Therapy Treatment/Discharge addendum  PHYSICAL THERAPY DISCHARGE SUMMARY  Visits from Start of Care: 5  Current functional level related to goals / functional outcomes: See below   Remaining deficits: See below   Education / Equipment: HEP  Plan: Patient agrees to discharge.  Patient goals were not met. Patient is being discharged due to not returning since the last visit.  ?????    Elsie Ra, PT, DPT 12/11/18 9:51 AM  Patient Details  Name: Jacqueline Reese MRN: [id number] Date of Birth: 08-Nov-1955 Referring Provider (PT): Nolene Ebbs, MD   Encounter Date: 05/22/2018  PT End of Session - 05/22/18 0800    Visit Number  5    Number of Visits  12    Date for PT Re-Evaluation  06/23/18    Authorization Type  Human MCR and MCD    PT Start Time  0736    PT Stop Time  0814    PT Time Calculation (min)  38 min       Past Medical History:  Diagnosis Date  . Arthritis   . Chronic back pain   . Depression   . Diabetes mellitus without complication ([place])   . Gout   . Hypertension   . Schizophrenic disorder [facility])     Past Surgical History:  Procedure Laterality Date  . BREAST BIOPSY Left    core biopsy  . CESAREAN SECTION     x2  . NASAL ENDOSCOPY Left 09/13/2015   Procedure: NASAL ENDOSCOPY WITH NASAL INTABATION;  Surgeon: Jerrell Belfast, MD;  Location: [place];  Service: ENT;  Laterality: Left;    There were no vitals filed for this visit.  Subjective Assessment - 05/22/18 0756    Subjective  I am sore.     Currently in Pain?  --   I am not worried about my pain.                       [facility] Adult PT Treatment/Exercise - 05/22/18 0001      Ambulation/Gait   Ambulation/Gait Assistance  6: Modified independent (Device/Increase time)    Ambulation Distance (Feet)  76 Feet    Assistive device  4-wheeled walker    Gait Pattern  Step-through pattern    Gait velocity  .66 ft/sec   76 ft/ 115 sec     Knee/Hip Exercises: Aerobic   Nustep  L4 x 5 min LE only      Knee/Hip Exercises: Standing   Other Standing Knee Exercises  narrow stance with head turns 4 ways x 60 sec without LOB      Knee/Hip Exercises: Seated   Long Arc Quad  Both;Weights;20 reps;2 Firefighter  Strengthening;Both;Weights;2 sets;10 reps    Marching Weights  5 lbs.    Sit to Sand  5 reps;without UE support   4 sets average 32 seconds for 5 x STS , no UE     Knee/Hip Exercises: Supine   Bridges  --    Bridges with [employer]  20 reps    Other Supine Knee/Hip Exercises  clam green x 20     Other Supine Knee/Hip Exercises  ball squeeze x 20                   PT Long Term Goals - 05/12/18 1036  PT LONG TERM GOAL #1   Title  Pt will be I and compliant with HEP. (6 weeks 06/23/18)    Status  On-going      PT LONG TERM GOAL #2   Title  Pt will improve hip/knee strength to 4+/5 MMT grossly to improve function. (6 weeks 06/23/18)    Status  On-going      PT LONG TERM GOAL #3   Title  Pt will improve FOTO to less than 60% limited. (6 weeks 06/23/18)    Status  On-going      PT LONG TERM GOAL #4   Title  Pt will be able to ambulate community distance at least 400 ft with LRAD and less than 5/10 pain. (6 weeks 06/23/18)    Status  On-going      PT LONG TERM GOAL #5   Title  New goal: Pt will improve TUG to less than 14 sec  with LRAD to show improved gait speed and dynamic balance. 6 weeks    Baseline  20    Status  New            Plan - 05/22/18 0825    Clinical Impression Statement  Worked on sit-stand for LE strengthening. Able to complete 5 x STS in 32 sec without UE support. Continued with mat exercises for Hip strength. Ambulated 76 feet with RW and decreased step length on right. Able to maintain fair posture.     PT Next Visit Plan  review HEP, begin hip  knee stretching and strengthening, consider modalaties for pain, balance.     PT Home Exercise Plan  LAQ, quad/hip flexor stretch, HSS, standing H.S curl, hip abd, hip ext, marching    Consulted and Agree with Plan of Care  Patient       Patient will benefit from skilled therapeutic intervention in order to improve the following deficits and impairments:     Visit Diagnosis: Chronic pain of left knee  Chronic pain of right knee  Muscle weakness (generalized)  Difficulty in walking, not elsewhere classified     Problem List Patient Active Problem List   Diagnosis Date Noted  . Angioedema 09/13/2015  . Anaphylaxis   . Hypertension, essential   . Acute hypoxemic respiratory failure ([place])   . Type 2 diabetes mellitus without complication, without long-term current use of insulin ([place])   . DIAB W/O MENTION COMP TYPE II/UNS TYPE UNCNTRL 01/19/2010  . FOOT ULCER, RIGHT 01/19/2010  . MENOPAUSE-RELATED VASOMOTOR SYMPTOMS, HOT FLASHES 10/04/2009  . GOUT, UNSPECIFIED 06/07/2009  . LICHEN SIMPLEX CHRONICUS 11/30/2008  . OTITIS EXTERNA, CHRONIC 04/16/2008  . OBESITY, NOS 05/02/2006  . SCHIZOPHRENIA 05/02/2006  . HYPERTENSION, BENIGN SYSTEMIC 05/02/2006  . GASTROESOPHAGEAL REFLUX, NO ESOPHAGITIS 05/02/2006    Dorene Ar, PTA 05/22/2018, 8:31 AM  [facility] [address] [place], [place], [address] Phone: [phone]   Fax:  [phone]  Name: Jacqueline Reese MRN: [id number] Date of Birth: 27-Jan-1956

## 2018-05-26 ENCOUNTER — Ambulatory Visit: Payer: Medicare HMO | Admitting: Physical Therapy

## 2018-05-29 ENCOUNTER — Ambulatory Visit: Payer: Medicare HMO

## 2018-05-30 ENCOUNTER — Ambulatory Visit: Payer: Medicare HMO | Admitting: Physical Therapy

## 2018-06-02 ENCOUNTER — Ambulatory Visit: Payer: Medicare HMO | Admitting: Physical Therapy

## 2018-06-03 ENCOUNTER — Ambulatory Visit: Payer: Medicare HMO

## 2018-06-04 ENCOUNTER — Telehealth: Payer: Self-pay | Admitting: Physical Therapy

## 2018-06-04 NOTE — Telephone Encounter (Signed)
Jacqueline Reese was contacted today regarding the temporary reduction of OP Rehab Services due to concerns for community transmission of Covid-19.    Therapist advised the patient to continue to perform their HEP and assured they had no unanswered questions at this time.  The patient was offered and declined the continuation in their POC by using methods such as an e-visit, virtual check in, or telehealth visit.    Outpatient Rehabilitation Services will follow up with this client when we are able to safely resume care at the [facility] in person.   Patient is aware we can be reached by telephone during limited business hours in the meantime.

## 2018-06-09 ENCOUNTER — Ambulatory Visit: Payer: Medicare HMO

## 2018-06-10 ENCOUNTER — Ambulatory Visit: Payer: Medicare HMO | Admitting: Physical Therapy

## 2018-06-16 ENCOUNTER — Encounter: Payer: Medicare HMO | Admitting: Physical Therapy

## 2018-06-16 ENCOUNTER — Other Ambulatory Visit: Payer: Self-pay

## 2018-06-16 ENCOUNTER — Ambulatory Visit (HOSPITAL_COMMUNITY)
Admission: EM | Admit: 2018-06-16 | Discharge: 2018-06-16 | Disposition: A | Discharge status: Home / Self Care | Payer: Medicare HMO | Attending: Family Medicine | Admitting: Family Medicine

## 2018-06-16 ENCOUNTER — Encounter (HOSPITAL_COMMUNITY): Payer: Self-pay | Admitting: Family Medicine

## 2018-06-16 DIAGNOSIS — M19012 Primary osteoarthritis, left shoulder: Secondary | ICD-10-CM | POA: Diagnosis not present

## 2018-06-16 MED ORDER — MELOXICAM 15 MG PO TABS: 15.0000 mg | ORAL_TABLET | Freq: Every day | ORAL | 1 refills | Status: DC

## 2018-06-16 NOTE — Discharge Instructions (Signed)
Follow up with your doctor

## 2018-06-16 NOTE — ED Provider Notes (Signed)
[place]    CSN: [id number] Arrival date & time: 06/16/18  2993     History   Chief Complaint Chief Complaint  Patient presents with  . Arm Pain  . Leg Pain    HPI Jacqueline Reese Route is a 63 y.o. female.   63 yo established woman presents to [name].  Patient reports left arm pain today, pain into left fingers.  Pain in right leg.  Reports pain for one week.  The shoulder pain began after starting home exercises.  The pain is located in the posterior shoulder joint line and patient notes numbness in 4 fingers.    No neck pain or fever.  No trauma.  Patient also has some myalgias in legs which she is exercising.     Past Medical History:  Diagnosis Date  . Arthritis   . Chronic back pain   . Depression   . Diabetes mellitus without complication ([place])   . Gout   . Hypertension   . Schizophrenic disorder [facility])     Patient Active Problem List   Diagnosis Date Noted  . Angioedema 09/13/2015  . Anaphylaxis   . Hypertension, essential   . Acute hypoxemic respiratory failure ([place])   . Type 2 diabetes mellitus without complication, without long-term current use of insulin ([place])   . DIAB W/O MENTION COMP TYPE II/UNS TYPE UNCNTRL 01/19/2010  . FOOT ULCER, RIGHT 01/19/2010  . MENOPAUSE-RELATED VASOMOTOR SYMPTOMS, HOT FLASHES 10/04/2009  . GOUT, UNSPECIFIED 06/07/2009  . LICHEN SIMPLEX CHRONICUS 11/30/2008  . OTITIS EXTERNA, CHRONIC 04/16/2008  . OBESITY, NOS 05/02/2006  . SCHIZOPHRENIA 05/02/2006  . HYPERTENSION, BENIGN SYSTEMIC 05/02/2006  . GASTROESOPHAGEAL REFLUX, NO ESOPHAGITIS 05/02/2006    Past Surgical History:  Procedure Laterality Date  . BREAST BIOPSY Left    core biopsy  . CESAREAN SECTION     x2  . NASAL ENDOSCOPY Left 09/13/2015   Procedure: NASAL ENDOSCOPY WITH NASAL INTABATION;  Surgeon: Jerrell Belfast, MD;  Location: [place];  Service: ENT;  Laterality: Left;    OB History   No obstetric history on file.      Home Medications     Prior to Admission medications   Medication Sig Start Date End Date Taking? Authorizing Provider  clonazePAM (KLONOPIN) 1 MG tablet Take 1 mg by mouth 3 (three) times daily.    Yes [provider]  CYCLOBENZAPRINE HCL PO Take by mouth.   Yes [provider]  gabapentin (NEURONTIN) 300 MG capsule Take 2 capsules (600 mg total) by mouth 3 (three) times daily. 12/27/17  Yes Newt Minion, MD  HYDROcodone-acetaminophen (NORCO) 10-325 MG tablet Take 1 tablet by mouth every 6 (six) hours as needed for pain. 10/16/17  Yes [provider]  LINZESS 145 MCG CAPS capsule Take 145 mcg by mouth every 14 (fourteen) days. 09/14/17  Yes [provider]  metFORMIN (GLUCOPHAGE) 500 MG tablet Take 500 mg by mouth 2 (two) times daily with a meal.   Yes [provider]  predniSONE (DELTASONE) 10 MG tablet Take 1 tablet (10 mg total) by mouth daily with breakfast. 05/13/18  Yes Newt Minion, MD  Rosuvastatin Calcium (CRESTOR PO) Take by mouth.   Yes [provider]  spironolactone (ALDACTONE) 50 MG tablet Take 50 mg by mouth daily.   Yes [provider]  ziprasidone (GEODON) 80 MG capsule Take 160 mg by mouth every evening.  07/13/15  Yes [provider]  acetaminophen (TYLENOL) 500 MG tablet Take 1,500  mg by mouth every 6 (six) hours as needed for mild pain.    [provider]  aspirin EC 325 MG tablet Take 325 mg by mouth daily.    [provider]  meloxicam (MOBIC) 15 MG tablet Take 1 tablet (15 mg total) by mouth daily. 06/16/18   Robyn Haber, MD  potassium chloride SA (K-DUR,KLOR-CON) 20 MEQ tablet Take 1 tablet (20 mEq total) by mouth 2 (two) times daily for 3 days. 10/24/17 10/27/17  Albesa Seen, PA-C    Family History Family History  Problem Relation Age of Onset  . Diabetes Mother     Social History Social History   Tobacco Use  . Smoking status: Current Every Day Smoker    Packs/day: 0.33    Types:  Cigarettes  . Smokeless tobacco: Never Used  Substance Use Topics  . Alcohol use: No  . Drug use: No     Allergies   Lisinopril; Chantix [varenicline]; Doxycycline; Ibuprofen; Lamisil [terbinafine]; Cephalexin; and Sulfa antibiotics   Review of Systems Review of Systems   Physical Exam Triage Vital Signs ED Triage Vitals [06/16/18 0829]  Enc Vitals Group     BP      Pulse      Resp      Temp      Temp src      SpO2      Weight      Height      Head Circumference      Peak Flow      Pain Score 7     Pain Loc      Pain Edu?      Excl. in [place]?    No data found.  Updated Vital Signs BP 127/75 (BP Location: Left Arm)   Pulse (!) 115   Temp 98.5 F (36.9 C) (Oral)   Resp 20   LMP 05/09/2010   SpO2 99%    Physical Exam Vitals signs and nursing note reviewed.  Constitutional:      Appearance: Normal appearance. She is obese.  HENT:     Head: Normocephalic.  Eyes:     Conjunctiva/sclera: Conjunctivae normal.  Neck:     Musculoskeletal: Normal range of motion and neck supple.  Cardiovascular:     Rate and Rhythm: Normal rate.  Pulmonary:     Effort: Pulmonary effort is normal.  Musculoskeletal: Normal range of motion.        General: Tenderness present. No swelling, deformity or signs of injury.     Comments: Mildly tender left posterior joint line of shoulder.   Skin:    General: Skin is warm and dry.  Neurological:     General: No focal deficit present.     Mental Status: She is alert.      UC Treatments / Results  Labs (all labs ordered are listed, but only abnormal results are displayed) Labs Reviewed - No data to display  EKG None  Radiology No results found.  Procedures Procedures (including critical care time)  Medications Ordered in UC Medications - No data to display  Initial Impression / Assessment and Plan / UC Course  I have reviewed the triage vital signs and the nursing notes.  Pertinent labs & imaging results that were  available during my care of the patient were reviewed by me and considered in my medical decision making (see chart for details).    Final Clinical Impressions(s) / UC Diagnoses   Final diagnoses:  Arthritis of left shoulder  region     Discharge Instructions     Follow up with your doctor    ED Prescriptions    Medication Sig Dispense Auth. Provider   meloxicam (MOBIC) 15 MG tablet Take 1 tablet (15 mg total) by mouth daily. 30 tablet Robyn Haber, MD     Controlled Substance Prescriptions [place] Controlled Substance Registry consulted? Not Applicable   Robyn Haber, MD 06/16/18 [phone]

## 2018-06-16 NOTE — ED Triage Notes (Signed)
Patient reports left arm pain today, pain into left fingers.  Pain in right leg.  Reports pain for one week.

## 2018-06-23 ENCOUNTER — Other Ambulatory Visit (INDEPENDENT_AMBULATORY_CARE_PROVIDER_SITE_OTHER): Payer: Self-pay | Admitting: Orthopedic Surgery

## 2018-06-27 ENCOUNTER — Telehealth: Payer: Self-pay | Admitting: Physical Therapy

## 2018-06-27 ENCOUNTER — Telehealth (INDEPENDENT_AMBULATORY_CARE_PROVIDER_SITE_OTHER): Payer: Self-pay

## 2018-06-27 ENCOUNTER — Encounter (INDEPENDENT_AMBULATORY_CARE_PROVIDER_SITE_OTHER): Payer: Self-pay | Admitting: Physician Assistant

## 2018-06-27 NOTE — Telephone Encounter (Signed)
Pt request refill on prednisone and states it needs to be sent through [facility]

## 2018-06-27 NOTE — Telephone Encounter (Signed)
Left message, checking in for HEP questions and left contact info to contact us PRN. Rommie Dunn C. Taliyah Watrous PT, DPT 06/27/18 10:26 AM

## 2018-06-27 NOTE — Telephone Encounter (Signed)
Shawn , please advise Do you want me to refill, thanks.

## 2018-06-27 NOTE — Telephone Encounter (Signed)
Looks like a duda patient

## 2018-06-30 NOTE — Telephone Encounter (Signed)
Refused at this time, per [facility]. Pt needs to be seen and due to COVID-19 and health status we are not prescribing steroids at this time for her.

## 2018-07-03 ENCOUNTER — Ambulatory Visit: Payer: Medicare HMO

## 2018-07-13 ENCOUNTER — Other Ambulatory Visit: Payer: Self-pay

## 2018-07-13 ENCOUNTER — Encounter (HOSPITAL_COMMUNITY): Payer: Self-pay | Admitting: Family Medicine

## 2018-07-13 ENCOUNTER — Ambulatory Visit (HOSPITAL_COMMUNITY)
Admission: EM | Admit: 2018-07-13 | Discharge: 2018-07-13 | Disposition: A | Discharge status: Home / Self Care | Payer: Medicare HMO | Attending: Family Medicine | Admitting: Family Medicine

## 2018-07-13 DIAGNOSIS — M1990 Unspecified osteoarthritis, unspecified site: Secondary | ICD-10-CM | POA: Diagnosis not present

## 2018-07-13 MED ORDER — MELOXICAM 15 MG PO TABS: 15.0000 mg | ORAL_TABLET | Freq: Every day | ORAL | 1 refills | Status: DC

## 2018-07-13 NOTE — ED Provider Notes (Signed)
[place]    CSN: [id number] Arrival date & time: 07/13/18  1113     History   Chief Complaint No chief complaint on file.   HPI Jacqueline Reese is a 63 y.o. female.   63 yo woman with chronic hip arthritis and ankle pain presents for evaluation and treatment.  She was last seen here a month ago.  Patient is on chronic benzodiazepines and opiates.  She was last seen 4/23 (17 days ago) in [place] where her PCP refilled her pain medicine(120 tablets of hydrocodone 7.5)  Note from 06/16/18 when I saw her here: 63 yo established woman presents to [facility].  Patient reports left arm pain today, pain into left fingers.  Pain in right leg.  Reports pain for one week.  The shoulder pain began after starting home exercises.  The pain is located in the posterior shoulder joint line and patient notes numbness in 4 fingers.    No neck pain or fever.  No trauma.  Patient also has some myalgias in legs which she is exercising.  -->Patient treated with Meloxicam     Past Medical History:  Diagnosis Date  . Arthritis   . Chronic back pain   . Depression   . Diabetes mellitus without complication ([place])   . Gout   . Hypertension   . Schizophrenic disorder [facility])     Patient Active Problem List   Diagnosis Date Noted  . Angioedema 09/13/2015  . Anaphylaxis   . Hypertension, essential   . Acute hypoxemic respiratory failure ([place])   . Type 2 diabetes mellitus without complication, without long-term current use of insulin ([place])   . DIAB W/O MENTION COMP TYPE II/UNS TYPE UNCNTRL 01/19/2010  . FOOT ULCER, RIGHT 01/19/2010  . MENOPAUSE-RELATED VASOMOTOR SYMPTOMS, HOT FLASHES 10/04/2009  . GOUT, UNSPECIFIED 06/07/2009  . LICHEN SIMPLEX CHRONICUS 11/30/2008  . OTITIS EXTERNA, CHRONIC 04/16/2008  . OBESITY, NOS 05/02/2006  . SCHIZOPHRENIA 05/02/2006  . HYPERTENSION, BENIGN SYSTEMIC 05/02/2006  . GASTROESOPHAGEAL REFLUX, NO ESOPHAGITIS 05/02/2006    Past Surgical  History:  Procedure Laterality Date  . BREAST BIOPSY Left    core biopsy  . CESAREAN SECTION     x2  . NASAL ENDOSCOPY Left 09/13/2015   Procedure: NASAL ENDOSCOPY WITH NASAL INTABATION;  Surgeon: Jerrell Belfast, MD;  Location: [place];  Service: ENT;  Laterality: Left;    OB History   No obstetric history on file.      Home Medications    Prior to Admission medications   Medication Sig Start Date End Date Taking? Authorizing Provider  clonazePAM (KLONOPIN) 1 MG tablet Take 1 mg by mouth 3 (three) times daily.    Yes [provider]  CYCLOBENZAPRINE HCL PO Take by mouth.   Yes [provider]  gabapentin (NEURONTIN) 300 MG capsule Take 2 capsules (600 mg total) by mouth 3 (three) times daily. 12/27/17  Yes Newt Minion, MD  acetaminophen (TYLENOL) 500 MG tablet Take 1,500 mg by mouth every 6 (six) hours as needed for mild pain.    [provider]  aspirin EC 325 MG tablet Take 325 mg by mouth daily.    [provider]  HYDROcodone-acetaminophen (NORCO) 10-325 MG tablet Take 1 tablet by mouth every 6 (six) hours as needed for pain. 10/16/17   [provider]  LINZESS 145 MCG CAPS capsule Take 145 mcg by mouth every 14 (fourteen) days. 09/14/17   [provider]  meloxicam (MOBIC) 15 MG tablet  Take 1 tablet (15 mg total) by mouth daily. 07/13/18   Robyn Haber, MD  metFORMIN (GLUCOPHAGE) 500 MG tablet Take 500 mg by mouth 2 (two) times daily with a meal.    [provider]  potassium chloride SA (K-DUR,KLOR-CON) 20 MEQ tablet Take 1 tablet (20 mEq total) by mouth 2 (two) times daily for 3 days. 10/24/17 10/27/17  Langston Masker B, PA-C  predniSONE (DELTASONE) 10 MG tablet Take 1 tablet (10 mg total) by mouth daily with breakfast. 05/13/18   Newt Minion, MD  Rosuvastatin Calcium (CRESTOR PO) Take by mouth.    [provider]  spironolactone (ALDACTONE) 50 MG tablet Take 50 mg by mouth daily.    [provider]  ziprasidone (GEODON) 80 MG capsule Take 160 mg by mouth every evening.  07/13/15   [provider]    Family History Family History  Problem Relation Age of Onset  . Diabetes Mother     Social History Social History   Tobacco Use  . Smoking status: Current Every Day Smoker    Packs/day: 0.33    Types: Cigarettes  . Smokeless tobacco: Never Used  Substance Use Topics  . Alcohol use: No  . Drug use: No     Allergies   Lisinopril; Chantix [varenicline]; Doxycycline; Ibuprofen; Lamisil [terbinafine]; Cephalexin; and Sulfa antibiotics   Review of Systems Review of Systems  Respiratory: Negative.   Musculoskeletal: Positive for arthralgias and myalgias.  All other systems reviewed and are negative.    Physical Exam Triage Vital Signs ED Triage Vitals  Enc Vitals Group     BP      Pulse      Resp      Temp      Temp src      SpO2      Weight      Height      Head Circumference      Peak Flow      Pain Score      Pain Loc      Pain Edu?      Excl. in [place]?    No data found.  Updated Vital Signs BP 122/71   Pulse (!) 108   Resp 18   LMP 05/09/2010   SpO2 99%    Physical Exam Vitals signs and nursing note reviewed.  Constitutional:      Appearance: Normal appearance. She is obese.  HENT:     Head: Normocephalic.     Mouth/Throat:     Pharynx: Oropharynx is clear.  Neck:     Musculoskeletal: Normal range of motion and neck supple.  Pulmonary:     Effort: Pulmonary effort is normal.  Musculoskeletal: Normal range of motion.        General: No deformity or signs of injury.  Skin:    General: Skin is warm and dry.  Neurological:     General: No focal deficit present.     Mental Status: She is alert.  Psychiatric:        Mood and Affect: Mood normal.        Behavior: Behavior normal.      UC Treatments / Results  Labs (all labs ordered are listed, but only abnormal results are displayed) Labs Reviewed - No data to display  EKG  None  Radiology No results found.  Procedures Procedures (including critical care time)  Medications Ordered in UC Medications - No data to display  Initial Impression / Assessment and  Plan / UC Course  I have reviewed the triage vital signs and the nursing notes.  Pertinent labs & imaging results that were available during my care of the patient were reviewed by me and considered in my medical decision making (see chart for details).    Final Clinical Impressions(s) / UC Diagnoses   Final diagnoses:  Arthritis     Discharge Instructions     Try taking Turmeric 1 gm twice daily to help with the arthritis    ED Prescriptions    Medication Sig Dispense Auth. Provider   meloxicam (MOBIC) 15 MG tablet Take 1 tablet (15 mg total) by mouth daily. 30 tablet Robyn Haber, MD     Controlled Substance Prescriptions [place] Controlled Substance Registry consulted? Not Applicable   Robyn Haber, MD 07/13/18 1140

## 2018-07-13 NOTE — Discharge Instructions (Addendum)
Try taking Turmeric 1 gm twice daily to help with the arthritis

## 2018-07-13 NOTE — ED Triage Notes (Signed)
Assessment per Dr Joseph Art.  Pt denies injury.  C/O musculoskeletal pain.

## 2018-07-23 ENCOUNTER — Ambulatory Visit
Admission: RE | Admit: 2018-07-23 | Discharge: 2018-07-23 | Disposition: A | Discharge status: Home / Self Care | Payer: Medicare HMO | Source: Ambulatory Visit | Attending: Internal Medicine | Admitting: Internal Medicine

## 2018-07-23 ENCOUNTER — Other Ambulatory Visit: Payer: Self-pay

## 2018-07-23 DIAGNOSIS — Z1231 Encounter for screening mammogram for malignant neoplasm of breast: Secondary | ICD-10-CM

## 2018-07-27 ENCOUNTER — Ambulatory Visit (HOSPITAL_COMMUNITY)
Admission: EM | Admit: 2018-07-27 | Discharge: 2018-07-27 | Disposition: A | Discharge status: Home / Self Care | Payer: Medicare HMO | Attending: Internal Medicine | Admitting: Internal Medicine

## 2018-07-27 ENCOUNTER — Encounter (HOSPITAL_COMMUNITY): Payer: Self-pay | Admitting: Emergency Medicine

## 2018-07-27 DIAGNOSIS — M25562 Pain in left knee: Secondary | ICD-10-CM | POA: Diagnosis not present

## 2018-07-27 DIAGNOSIS — M25561 Pain in right knee: Secondary | ICD-10-CM

## 2018-07-27 DIAGNOSIS — G8929 Other chronic pain: Secondary | ICD-10-CM

## 2018-07-27 MED ORDER — DICLOFENAC SODIUM 1 % TD GEL: 2.0000 g | Freq: Four times a day (QID) | TRANSDERMAL | 1 refills | Status: AC

## 2018-07-27 NOTE — ED Provider Notes (Signed)
[place]    CSN: [id number] Arrival date & time: 07/27/18  1042     History   Chief Complaint Chief Complaint  Patient presents with  . Leg Pain    HPI Jacqueline Reese is a 63 y.o. female with a history of osteoarthritis, diabetes mellitus type 2 on oral hypoglycemic agents comes to urgent care with complaints of persistent pain in both knees and thighs.  Symptoms have been ongoing for weeks and seems to be impacting her ability to exercise.  Pain is at times of moderate intensity.  Aggravated by movement.  Prescription medications have not relieved the pain.  Pain is now radiating.  No nausea or vomiting.  Patient typically has joint stiffness in the mornings but that improves after about an hour of gentle stretching.  Patient sees the pain clinic for chronic pain management.  She recently saw orthopedic surgery regarding arthritis.  She is not open to having any surgical intervention done.  She is not open to intra-articular steroid injections.Marland Kitchen   HPI  Past Medical History:  Diagnosis Date  . Arthritis   . Chronic back pain   . Depression   . Diabetes mellitus without complication ([place])   . Gout   . Hypertension   . Schizophrenic disorder [facility])     Patient Active Problem List   Diagnosis Date Noted  . Angioedema 09/13/2015  . Anaphylaxis   . Hypertension, essential   . Acute hypoxemic respiratory failure ([place])   . Type 2 diabetes mellitus without complication, without long-term current use of insulin ([place])   . DIAB W/O MENTION COMP TYPE II/UNS TYPE UNCNTRL 01/19/2010  . FOOT ULCER, RIGHT 01/19/2010  . MENOPAUSE-RELATED VASOMOTOR SYMPTOMS, HOT FLASHES 10/04/2009  . GOUT, UNSPECIFIED 06/07/2009  . LICHEN SIMPLEX CHRONICUS 11/30/2008  . OTITIS EXTERNA, CHRONIC 04/16/2008  . OBESITY, NOS 05/02/2006  . SCHIZOPHRENIA 05/02/2006  . HYPERTENSION, BENIGN SYSTEMIC 05/02/2006  . GASTROESOPHAGEAL REFLUX, NO ESOPHAGITIS 05/02/2006    Past Surgical History:   Procedure Laterality Date  . BREAST BIOPSY Left    core biopsy  . CESAREAN SECTION     x2  . NASAL ENDOSCOPY Left 09/13/2015   Procedure: NASAL ENDOSCOPY WITH NASAL INTABATION;  Surgeon: Jerrell Belfast, MD;  Location: [place];  Service: ENT;  Laterality: Left;    OB History   No obstetric history on file.      Home Medications    Prior to Admission medications   Medication Sig Start Date End Date Taking? Authorizing Provider  acetaminophen (TYLENOL) 500 MG tablet Take 1,500 mg by mouth every 6 (six) hours as needed for mild pain.    [provider]  aspirin EC 325 MG tablet Take 325 mg by mouth daily.    [provider]  clonazePAM (KLONOPIN) 1 MG tablet Take 1 mg by mouth 3 (three) times daily.     [provider]  CYCLOBENZAPRINE HCL PO Take by mouth.    [provider]  diclofenac sodium (VOLTAREN) 1 % GEL Apply 2 g topically 4 (four) times daily. 07/27/18   Lamptey, Myrene Galas, MD  gabapentin (NEURONTIN) 300 MG capsule Take 2 capsules (600 mg total) by mouth 3 (three) times daily. 12/27/17   Newt Minion, MD  HYDROcodone-acetaminophen (NORCO) 10-325 MG tablet Take 1 tablet by mouth every 6 (six) hours as needed for pain. 10/16/17   [provider]  LINZESS 145 MCG CAPS capsule Take 145 mcg by mouth every 14 (fourteen) days. 09/14/17  [provider]  meloxicam (MOBIC) 15 MG tablet Take 1 tablet (15 mg total) by mouth daily. 07/13/18   Robyn Haber, MD  metFORMIN (GLUCOPHAGE) 500 MG tablet Take 500 mg by mouth 2 (two) times daily with a meal.    [provider]  potassium chloride SA (K-DUR,KLOR-CON) 20 MEQ tablet Take 1 tablet (20 mEq total) by mouth 2 (two) times daily for 3 days. 10/24/17 10/27/17  Langston Masker B, PA-C  predniSONE (DELTASONE) 10 MG tablet Take 1 tablet (10 mg total) by mouth daily with breakfast. 05/13/18   Newt Minion, MD  Rosuvastatin Calcium (CRESTOR PO) Take by mouth.    [provider]   spironolactone (ALDACTONE) 50 MG tablet Take 50 mg by mouth daily.    [provider]  ziprasidone (GEODON) 80 MG capsule Take 160 mg by mouth every evening.  07/13/15   [provider]    Family History Family History  Problem Relation Age of Onset  . Diabetes Mother     Social History Social History   Tobacco Use  . Smoking status: Current Every Day Smoker    Packs/day: 0.33    Types: Cigarettes  . Smokeless tobacco: Never Used  Substance Use Topics  . Alcohol use: No  . Drug use: No     Allergies   Lisinopril; Chantix [varenicline]; Doxycycline; Ibuprofen; Lamisil [terbinafine]; Cephalexin; and Sulfa antibiotics   Review of Systems Review of Systems  Constitutional: Positive for activity change. Negative for appetite change.  Respiratory: Negative for cough, shortness of breath and wheezing.   Cardiovascular: Negative for chest pain and palpitations.  Gastrointestinal: Negative for abdominal distention, abdominal pain, nausea and vomiting.  Genitourinary: Negative.   Musculoskeletal: Positive for arthralgias, back pain, gait problem and myalgias. Negative for joint swelling, neck pain and neck stiffness.  Skin: Negative.   Neurological: Negative for dizziness, tremors, seizures, weakness and numbness.     Physical Exam Triage Vital Signs ED Triage Vitals  Enc Vitals Group     BP 07/27/18 1058 117/62     Pulse Rate 07/27/18 1058 96     Resp 07/27/18 1058 16     Temp 07/27/18 1058 98.8 F (37.1 C)     Temp Source 07/27/18 1058 Oral     SpO2 07/27/18 1058 100 %     Weight --      Height --      Head Circumference --      Peak Flow --      Pain Score 07/27/18 1059 9     Pain Loc --      Pain Edu? --      Excl. in [place]? --    No data found.  Updated Vital Signs BP 117/62 (BP Location: Left Arm)   Pulse 96   Temp 98.8 F (37.1 C) (Oral)   Resp 16   LMP 05/09/2010   SpO2 100%   Visual Acuity Right Eye Distance:   Left Eye  Distance:   Bilateral Distance:    Right Eye Near:   Left Eye Near:    Bilateral Near:     Physical Exam   UC Treatments / Results  Labs (all labs ordered are listed, but only abnormal results are displayed) Labs Reviewed - No data to display  EKG None  Radiology No results found.  Procedures Procedures (including critical care time)  Medications Ordered in UC Medications - No data to display  Initial Impression / Assessment and Plan / UC Course  I have reviewed the triage vital signs and the nursing notes.  Pertinent labs & imaging results that were available during my care of the patient were reviewed by me and considered in my medical decision making (see chart for details).     1.  Osteoarthritis of both knees: Patient's options are limited.  She does not want any surgical or procedural intervention for the knee pain. She is currently on oral pain medications I added diclofenac gel to her treatment regimen. I encouraged the patient to follow-up with her primary care physician or orthopedic surgeon for further discussions if her clinical condition does not improve. Final Clinical Impressions(s) / UC Diagnoses   Final diagnoses:  Chronic pain of both knees   Discharge Instructions   None    ED Prescriptions    Medication Sig Dispense Auth. Provider   diclofenac sodium (VOLTAREN) 1 % GEL Apply 2 g topically 4 (four) times daily. 1 Tube Lamptey, Myrene Galas, MD     Controlled Substance Prescriptions [place] Controlled Substance Registry consulted? No   Chase Picket, MD 07/27/18 1319

## 2018-07-27 NOTE — ED Triage Notes (Signed)
Pt c/o bilateral leg pain for weeks, states its arthritis.

## 2018-09-01 ENCOUNTER — Other Ambulatory Visit: Payer: Self-pay

## 2018-09-01 ENCOUNTER — Ambulatory Visit (HOSPITAL_COMMUNITY)
Admission: EM | Admit: 2018-09-01 | Discharge: 2018-09-01 | Disposition: A | Discharge status: Home / Self Care | Payer: Medicare HMO | Attending: Emergency Medicine | Admitting: Emergency Medicine

## 2018-09-01 ENCOUNTER — Ambulatory Visit: Payer: Medicare HMO | Admitting: Physician Assistant

## 2018-09-01 ENCOUNTER — Encounter (HOSPITAL_COMMUNITY): Payer: Self-pay | Admitting: Emergency Medicine

## 2018-09-01 DIAGNOSIS — M79605 Pain in left leg: Secondary | ICD-10-CM | POA: Diagnosis not present

## 2018-09-01 DIAGNOSIS — G8929 Other chronic pain: Secondary | ICD-10-CM

## 2018-09-01 DIAGNOSIS — M79604 Pain in right leg: Secondary | ICD-10-CM

## 2018-09-01 MED ORDER — KETOROLAC TROMETHAMINE 60 MG/2ML IM SOLN
INTRAMUSCULAR | Status: AC
  Filled 2018-09-01: qty 2

## 2018-09-01 MED ORDER — METHYLPREDNISOLONE ACETATE 80 MG/ML IJ SUSP
INTRAMUSCULAR | Status: AC
  Filled 2018-09-01: qty 1

## 2018-09-01 MED ORDER — METHYLPREDNISOLONE ACETATE 80 MG/ML IJ SUSP
80.0000 mg | Freq: Once | INTRAMUSCULAR | Status: AC
  Administered 2018-09-01: 80 mg via INTRAMUSCULAR

## 2018-09-01 MED ORDER — KETOROLAC TROMETHAMINE 60 MG/2ML IM SOLN
60.0000 mg | Freq: Once | INTRAMUSCULAR | Status: AC
  Administered 2018-09-01: 60 mg via INTRAMUSCULAR

## 2018-09-01 NOTE — Discharge Instructions (Signed)
We gave you a shot of Toradol and Depo-Medrol today The Depo-Medrol is a steroid which may cause your blood sugars to temporarily increase, please keep a monitor on these  May continue with Tylenol [phone] mg every 4-6 hours Continue working on weight loss and exercises to increase mobility  Follow-up here with primary care if symptoms persisting

## 2018-09-01 NOTE — ED Provider Notes (Signed)
[place]    CSN: [id number] Arrival date & time: 09/01/18  0849      History   Chief Complaint Chief Complaint  Patient presents with  . Leg Pain    HPI Jacqueline Reese is a 63 y.o. female history of arthritis, chronic pain, DM type II, gout, hypertension, schizophrenia, hypertension, presenting today for evaluation of bilateral leg pain.  Patient states that over the past few weeks she has had worsening pain from her hips into her thighs knees lower legs and ankles.  She states that it feels as if her muscles are sore.  States that this is similar pain that she has chronically off and on.  Denies any new injury.  Denies increase in activity.  Denies any recent fall.  States that the pain makes it hard for her to walk and often ambulates with cane.  She has tried Robaxin, Flexeril, Naprosyn, diclofenac, other NSAIDs without relief.  She denies one particular area or joint that hurts worse than another.  She is requesting to try an injection given she has not had significant relief with oral NSAIDs and muscle relaxers.  Patient is unsure what A1c was.  She notes that is regularly and this morning it was around 80.  HPI  Past Medical History:  Diagnosis Date  . Arthritis   . Chronic back pain   . Depression   . Diabetes mellitus without complication ([place])   . Gout   . Hypertension   . Schizophrenic disorder [facility])     Patient Active Problem List   Diagnosis Date Noted  . Angioedema 09/13/2015  . Anaphylaxis   . Hypertension, essential   . Acute hypoxemic respiratory failure ([place])   . Type 2 diabetes mellitus without complication, without long-term current use of insulin ([place])   . DIAB W/O MENTION COMP TYPE II/UNS TYPE UNCNTRL 01/19/2010  . FOOT ULCER, RIGHT 01/19/2010  . MENOPAUSE-RELATED VASOMOTOR SYMPTOMS, HOT FLASHES 10/04/2009  . GOUT, UNSPECIFIED 06/07/2009  . LICHEN SIMPLEX CHRONICUS 11/30/2008  . OTITIS EXTERNA, CHRONIC 04/16/2008  . OBESITY, NOS  05/02/2006  . SCHIZOPHRENIA 05/02/2006  . HYPERTENSION, BENIGN SYSTEMIC 05/02/2006  . GASTROESOPHAGEAL REFLUX, NO ESOPHAGITIS 05/02/2006    Past Surgical History:  Procedure Laterality Date  . BREAST BIOPSY Left    core biopsy  . CESAREAN SECTION     x2  . NASAL ENDOSCOPY Left 09/13/2015   Procedure: NASAL ENDOSCOPY WITH NASAL INTABATION;  Surgeon: Jerrell Belfast, MD;  Location: [place];  Service: ENT;  Laterality: Left;    OB History   No obstetric history on file.      Home Medications    Prior to Admission medications   Medication Sig Start Date End Date Taking? Authorizing Provider  acetaminophen (TYLENOL) 500 MG tablet Take 1,500 mg by mouth every 6 (six) hours as needed for mild pain.    [provider]  aspirin EC 325 MG tablet Take 325 mg by mouth daily.    [provider]  clonazePAM (KLONOPIN) 1 MG tablet Take 1 mg by mouth 3 (three) times daily.     [provider]  CYCLOBENZAPRINE HCL PO Take by mouth.    [provider]  diclofenac sodium (VOLTAREN) 1 % GEL Apply 2 g topically 4 (four) times daily. 07/27/18   Lamptey, Myrene Galas, MD  gabapentin (NEURONTIN) 300 MG capsule Take 2 capsules (600 mg total) by mouth 3 (three) times daily. 12/27/17   Newt Minion, MD  HYDROcodone-acetaminophen [facility]) [phone]  MG tablet Take 1 tablet by mouth every 6 (six) hours as needed for pain. 10/16/17   [provider]  LINZESS 145 MCG CAPS capsule Take 145 mcg by mouth every 14 (fourteen) days. 09/14/17   [provider]  meloxicam (MOBIC) 15 MG tablet Take 1 tablet (15 mg total) by mouth daily. 07/13/18   Robyn Haber, MD  metFORMIN (GLUCOPHAGE) 500 MG tablet Take 500 mg by mouth 2 (two) times daily with a meal.    [provider]  potassium chloride SA (K-DUR,KLOR-CON) 20 MEQ tablet Take 1 tablet (20 mEq total) by mouth 2 (two) times daily for 3 days. 10/24/17 10/27/17  Langston Masker B, PA-C  predniSONE (DELTASONE) 10 MG  tablet Take 1 tablet (10 mg total) by mouth daily with breakfast. 05/13/18   Newt Minion, MD  Rosuvastatin Calcium (CRESTOR PO) Take by mouth.    [provider]  spironolactone (ALDACTONE) 50 MG tablet Take 50 mg by mouth daily.    [provider]  ziprasidone (GEODON) 80 MG capsule Take 160 mg by mouth every evening.  07/13/15   [provider]    Family History Family History  Problem Relation Age of Onset  . Diabetes Mother     Social History Social History   Tobacco Use  . Smoking status: Current Every Day Smoker    Packs/day: 0.33    Types: Cigarettes  . Smokeless tobacco: Never Used  Substance Use Topics  . Alcohol use: No  . Drug use: No     Allergies   Lisinopril, Chantix [varenicline], Doxycycline, Ibuprofen, Lamisil [terbinafine], Cephalexin, and Sulfa antibiotics   Review of Systems Review of Systems  Constitutional: Negative for fatigue and fever.  Eyes: Negative for visual disturbance.  Respiratory: Negative for shortness of breath.   Cardiovascular: Negative for chest pain.  Gastrointestinal: Negative for abdominal pain, nausea and vomiting.  Genitourinary: Negative for decreased urine volume and difficulty urinating.  Musculoskeletal: Positive for arthralgias, gait problem and myalgias. Negative for joint swelling.  Skin: Negative for color change, rash and wound.  Neurological: Negative for dizziness, weakness, light-headedness and headaches.     Physical Exam Triage Vital Signs ED Triage Vitals  Enc Vitals Group     BP 09/01/18 0911 107/75     Pulse Rate 09/01/18 0911 97     Resp 09/01/18 0911 18     Temp 09/01/18 0911 98.4 F (36.9 C)     Temp src --      SpO2 09/01/18 0911 97 %     Weight --      Height --      Head Circumference --      Peak Flow --      Pain Score 09/01/18 0908 7     Pain Loc --      Pain Edu? --      Excl. in [place]? --    No data found.  Updated Vital Signs BP 107/75 (BP Location: Right  Arm) Comment: large cuff  Pulse 97   Temp 98.4 F (36.9 C)   Resp 18   LMP 05/09/2010   SpO2 97%   Visual Acuity Right Eye Distance:   Left Eye Distance:   Bilateral Distance:    Right Eye Near:   Left Eye Near:    Bilateral Near:     Physical Exam Vitals signs and nursing note reviewed.  Constitutional:      Appearance: She is well-developed.     Comments: No acute  distress  HENT:     Head: Normocephalic and atraumatic.     Nose: Nose normal.  Eyes:     Conjunctiva/sclera: Conjunctivae normal.  Neck:     Musculoskeletal: Neck supple.  Cardiovascular:     Rate and Rhythm: Normal rate.  Pulmonary:     Effort: Pulmonary effort is normal. No respiratory distress.  Abdominal:     General: There is no distension.  Musculoskeletal: Normal range of motion.     Comments: Patient is sitting in wheelchair, stands up briefly to ambulate with a cane.   Diffusely tender throughout bony prominences of hip, throughout bilateral thighs, knees lower legs and ankles.  Full active range of motion.  Strength 4/5 and equal bilaterally at hips and knees.  No overlying changes noted to skin.  Skin:    General: Skin is warm and dry.  Neurological:     Mental Status: She is alert and oriented to person, place, and time.      UC Treatments / Results  Labs (all labs ordered are listed, but only abnormal results are displayed) Labs Reviewed - No data to display  EKG None  Radiology No results found.  Procedures Procedures (including critical care time)  Medications Ordered in UC Medications  ketorolac (TORADOL) injection 60 mg (60 mg Intramuscular Given 09/01/18 1042)  methylPREDNISolone acetate (DEPO-MEDROL) injection 80 mg (80 mg Intramuscular Given 09/01/18 1042)  methylPREDNISolone acetate (DEPO-MEDROL) 80 MG/ML injection (has no administration in time range)  ketorolac (TORADOL) 60 MG/2ML injection (has no administration in time range)    Initial Impression / Assessment  and Plan / UC Course  I have reviewed the triage vital signs and the nursing notes.  Pertinent labs & imaging results that were available during my care of the patient were reviewed by me and considered in my medical decision making (see chart for details).     Patient with flare of chronic lower extremity pain.  States that it is similar in nature to constant pains.  Denies any injury or increase in activity.  Do not suspect rhabdo.  Has tried multiple oral NSAIDs and muscle relaxers without relief.  Will provide IM Toradol and Depo-Medrol today as alternative trial therapy.  Patient states diabetes has been controlled.  Advised to monitor sugars given Depo-Medrol.  Patient verbalized understanding.  Advised to follow-up with PCP or here if symptoms persisting.  Follow-up in emergency room if developing any weakness.Discussed strict return precautions. Patient verbalized understanding and is agreeable with plan.  Final Clinical Impressions(s) / UC Diagnoses   Final diagnoses:  Chronic pain of both lower extremities     Discharge Instructions     We gave you a shot of Toradol and Depo-Medrol today The Depo-Medrol is a steroid which may cause your blood sugars to temporarily increase, please keep a monitor on these  May continue with Tylenol [phone] mg every 4-6 hours Continue working on weight loss and exercises to increase mobility  Follow-up here with primary care if symptoms persisting   ED Prescriptions    None     Controlled Substance Prescriptions [place] Controlled Substance Registry consulted? Not Applicable   Janith Lima, [place] 09/01/18 1856

## 2018-09-01 NOTE — ED Triage Notes (Signed)
Patient has chronic pain issues.  Patient is here for legs hurting.  Patient also has ankle and foot pain.  No known injury.  This is her typical type of pain

## 2018-09-02 ENCOUNTER — Telehealth: Payer: Self-pay | Admitting: Orthopedic Surgery

## 2018-09-02 NOTE — Telephone Encounter (Signed)
I called pt and advised that she needs to be seen in the office for eval last visit was in October. Pt unable to come today at 4:15 appt made for Thursday. Voiced understanding and will call with any questions.

## 2018-09-02 NOTE — Telephone Encounter (Signed)
Patient is in so much pain and wants Sharol Given to prescribe her something.  Please call patient [phone]

## 2018-09-04 ENCOUNTER — Telehealth: Payer: Self-pay | Admitting: Orthopedic Surgery

## 2018-09-04 ENCOUNTER — Ambulatory Visit (INDEPENDENT_AMBULATORY_CARE_PROVIDER_SITE_OTHER): Payer: Medicare HMO | Admitting: Physician Assistant

## 2018-09-04 ENCOUNTER — Ambulatory Visit (INDEPENDENT_AMBULATORY_CARE_PROVIDER_SITE_OTHER): Payer: Medicare HMO

## 2018-09-04 ENCOUNTER — Other Ambulatory Visit: Payer: Self-pay

## 2018-09-04 ENCOUNTER — Encounter: Payer: Self-pay | Admitting: Orthopedic Surgery

## 2018-09-04 ENCOUNTER — Ambulatory Visit: Payer: Medicare HMO | Admitting: Orthopedic Surgery

## 2018-09-04 VITALS — Ht 62.0 in | Wt 143.0 lb

## 2018-09-04 DIAGNOSIS — G8929 Other chronic pain: Secondary | ICD-10-CM

## 2018-09-04 DIAGNOSIS — M5442 Lumbago with sciatica, left side: Secondary | ICD-10-CM | POA: Diagnosis not present

## 2018-09-04 DIAGNOSIS — M5441 Lumbago with sciatica, right side: Secondary | ICD-10-CM

## 2018-09-04 MED ORDER — PREDNISONE 10 MG PO TABS: 10.0000 mg | ORAL_TABLET | Freq: Every day | ORAL | 0 refills | Status: AC

## 2018-09-04 NOTE — Progress Notes (Signed)
Office Visit Note   Patient: Jacqueline Reese           Date of Birth: 07-21-1955           MRN: [id number] Visit Date: 09/04/2018              Requested by: Nolene Ebbs, MD [address] [place],  [place] [address] PCP: Nolene Ebbs, MD  Chief Complaint  Patient presents with  . Lower Back - Pain      HPI: The patient is a 63 year old woman who presents with ongoing complaints of bilateral lower extremity pain and spasms.  She reports she had been going to physical therapy for her back pain but this was stopped due to COVID-19 restrictions but she does feel that this was helping.  She is also been referred for lumbar epidural steroid injections but is fearful of having intra-articular steroid injections and she was recently seen at urgent care for similar symptoms.  She denies any new trauma or other injuries but states that she constant throbbing aching pain in the legs and in her lower back.  She ambulates with assistive device.  She has been tried on multiple medications and is receiving hydrocodone 7.5 mg and clonazepam from elsewhere within the past several weeks for similar symptoms.  She does feel that prednisone has helped with her symptoms in the past.  Assessment & Plan: Visit Diagnoses:  1. Chronic bilateral low back pain with bilateral sciatica     Plan: We will begin prednisone 20 mg daily at breakfast x1 week then decrease to 10 mg daily at breakfast x1 week then 10 mg every other day at breakfast x1 week.  The patient was cautioned that this may cause a rise in her blood sugars and may need to be stopped if it is a significant elevation. We also discussed getting back into physical therapy and she is going to call and see if she can resume physical therapy at this point.  She will follow-up in several weeks or sooner should she have difficulty in the interim.  We discussed that some of her pain can be due to her hips and that she may need to consider bilateral  total hip arthroplasty at some point given her degree of pain and immobility. Follow-Up Instructions: Return in about 4 weeks (around 10/02/2018).   Ortho Exam  Patient is alert, oriented, no adenopathy, well-dressed, normal affect, normal respiratory effort. The patient presents at the wheelchair level.  She is nontender to palpation over the lower spine but she is mildly tender to palpation over the buttocks.  She has limited range of motion in both hips but negative straight leg raise bilaterally.  She is neurovascularly intact distally and without focal neurologic deficits in the lower extremities.  Imaging: No results found. No images are attached to the encounter.  Labs: Lab Results  Component Value Date   HGBA1C 8.4 01/19/2010   LABURIC 5.9 07/08/2009   LABURIC 9.9 (H) 06/06/2009   REPTSTATUS 06/04/2010 FINAL 06/02/2010   CULT  06/02/2010    GROUP B STREP(S.AGALACTIAE)ISOLATED Note: TESTING AGAINST S. AGALACTIAE NOT ROUTINELY PERFORMED DUE TO PREDICTABILITY OF AMP/PEN/VAN SUSCEPTIBILITY.     Lab Results  Component Value Date   ALBUMIN 3.8 10/24/2017   ALBUMIN 3.0 (L) 09/16/2015   ALBUMIN 4.0 06/02/2010   LABURIC 5.9 07/08/2009   LABURIC 9.9 (H) 06/06/2009    Lab Results  Component Value Date   MG 1.9 09/15/2015   MG 1.8 09/14/2015  MG 1.7 09/14/2015   No results found for: VD25OH  No results found for: PREALBUMIN CBC EXTENDED Latest Ref Rng & Units 10/24/2017 07/27/2017 04/07/2017  WBC 4.0 - 10.5 K/uL 13.4(H) 12.3(H) -  RBC 3.87 - 5.11 MIL/uL 4.10 4.03 -  HGB 12.0 - 15.0 g/dL 12.3 12.2 13.9  HCT 36.0 - 46.0 % 38.6 37.6 41.0  PLT 150 - 400 K/uL 394 496(H) -  NEUTROABS 1.7 - 7.7 K/uL 12.1(H) - -  LYMPHSABS 0.7 - 4.0 K/uL 0.8 - -     Body mass index is 26.16 kg/m.  Orders:  Orders Placed This Encounter  Procedures  . XR Lumbar Spine 2-3 Views   Meds ordered this encounter  Medications  . predniSONE (DELTASONE) 10 MG tablet    Sig: Take 1 tablet (10 mg  total) by mouth daily with breakfast.    Dispense:  60 tablet    Refill:  0     Procedures: No procedures performed  Clinical Data: No additional findings.  ROS:  All other systems negative, except as noted in the HPI. Review of Systems  Objective: Vital Signs: Ht 5\' 2"  (1.575 m)   Wt 143 lb (64.9 kg)   LMP 05/09/2010   BMI 26.16 kg/m   Specialty Comments:  No specialty comments available.  PMFS History: Patient Active Problem List   Diagnosis Date Noted  . Angioedema 09/13/2015  . Anaphylaxis   . Hypertension, essential   . Acute hypoxemic respiratory failure ([place])   . Type 2 diabetes mellitus without complication, without long-term current use of insulin ([place])   . DIAB W/O MENTION COMP TYPE II/UNS TYPE UNCNTRL 01/19/2010  . FOOT ULCER, RIGHT 01/19/2010  . MENOPAUSE-RELATED VASOMOTOR SYMPTOMS, HOT FLASHES 10/04/2009  . GOUT, UNSPECIFIED 06/07/2009  . LICHEN SIMPLEX CHRONICUS 11/30/2008  . OTITIS EXTERNA, CHRONIC 04/16/2008  . OBESITY, NOS 05/02/2006  . SCHIZOPHRENIA 05/02/2006  . HYPERTENSION, BENIGN SYSTEMIC 05/02/2006  . GASTROESOPHAGEAL REFLUX, NO ESOPHAGITIS 05/02/2006   Past Medical History:  Diagnosis Date  . Arthritis   . Chronic back pain   . Depression   . Diabetes mellitus without complication ([place])   . Gout   . Hypertension   . Schizophrenic disorder ([place])     Family History  Problem Relation Age of Onset  . Diabetes Mother     Past Surgical History:  Procedure Laterality Date  . BREAST BIOPSY Left    core biopsy  . CESAREAN SECTION     x2  . NASAL ENDOSCOPY Left 09/13/2015   Procedure: NASAL ENDOSCOPY WITH NASAL INTABATION;  Surgeon: Jerrell Belfast, MD;  Location[facility];  Service: ENT;  Laterality: Left;   Social History   Occupational History  . Not on file  Tobacco Use  . Smoking status: Current Every Day Smoker    Packs/day: 0.33    Types: Cigarettes  . Smokeless tobacco: Never Used  Substance and Sexual Activity  . Alcohol  use: No  . Drug use: No  . Sexual activity: Not on file

## 2018-09-04 NOTE — Telephone Encounter (Signed)
Patient called triage line requesting Shawn send in rx for rolling walker that she can sit on to [place]. She has one at home, but it is broken.  CB for patient [phone]

## 2018-09-04 NOTE — Telephone Encounter (Signed)
I will fax for a rolling walker to [facility] for patient 09/04/2018.

## 2018-09-04 NOTE — Telephone Encounter (Signed)
Patient seen today in our office but did not get a Rx for her chair.  She states hers is broken.  Please send to [facility] per patient.

## 2018-09-08 NOTE — Telephone Encounter (Signed)
Patient called in saying she couldn't use the walker its to heavy and big asking for one with a seat on it.  Please advise

## 2018-09-10 NOTE — Telephone Encounter (Signed)
I called patient and informed her that I will fax over her demo, last ov notes and [facility] form today to [phone] so we can try to get her walker. Patient is aware that it will take some time for the paperwork and insurance to be verified.

## 2018-09-16 ENCOUNTER — Telehealth: Payer: Self-pay

## 2018-09-16 ENCOUNTER — Telehealth: Payer: Self-pay | Admitting: Orthopedic Surgery

## 2018-09-16 NOTE — Telephone Encounter (Signed)
Patient called and requested refill of prednisone and some kind of pain medication her legs are hurting badly.  Please call patient @ [phone]

## 2018-09-16 NOTE — Telephone Encounter (Signed)
Can you call this patient back as well and advise her of Erin's message?  Thank you.

## 2018-09-16 NOTE — Telephone Encounter (Signed)
Refill not appropriate, will you call

## 2018-09-16 NOTE — Telephone Encounter (Signed)
Patient called stating that she would like another Rx to go with the Prednisone.  Stated that her legs are hurting really bad.  Cb# is [phone].  Please advise.  Thank you.

## 2018-09-17 ENCOUNTER — Telehealth: Payer: Self-pay | Admitting: Orthopedic Surgery

## 2018-09-17 ENCOUNTER — Other Ambulatory Visit: Payer: Self-pay | Admitting: Orthopedic Surgery

## 2018-09-17 NOTE — Telephone Encounter (Signed)
Rx refill Diclofenac 75 mg tablet

## 2018-09-17 NOTE — Telephone Encounter (Signed)
Patient was called and states she does not want another Rx for Prednisone but would like a low dose of pain medication, please advise. Thanks

## 2018-09-17 NOTE — Telephone Encounter (Signed)
Medication was refilled and sent to pharmacy this morning.

## 2018-09-19 ENCOUNTER — Telehealth: Payer: Self-pay | Admitting: Orthopedic Surgery

## 2018-09-19 ENCOUNTER — Other Ambulatory Visit: Payer: Self-pay | Admitting: Physician Assistant

## 2018-09-19 DIAGNOSIS — G8929 Other chronic pain: Secondary | ICD-10-CM

## 2018-09-19 MED ORDER — TIZANIDINE HCL 4 MG PO TABS: 4.0000 mg | ORAL_TABLET | Freq: Three times a day (TID) | ORAL | 1 refills | Status: DC

## 2018-09-19 NOTE — Telephone Encounter (Signed)
Received call from patient she advised did not take the Diclofenac asked if she can get a Rx for Tramadol instead. The number to contact patient is [phone]

## 2018-09-19 NOTE — Telephone Encounter (Signed)
Patient was called and lvm to try the medication that was sent in to her pharmacy yesterday per [facility] first and if she is having any more issues to schedule an appt to come in to the office and to be seen. If patient has any other questions please notify her of this thank you.

## 2018-09-19 NOTE — Telephone Encounter (Signed)
Patient called back and stated that she needed something for muscle spasm. Tizanidine 4 mg and the Tramadol.  Please call patient @ [phone]

## 2018-09-19 NOTE — Telephone Encounter (Signed)
Patient called advised the insurance company will not pay for the voltaren tabs. Patient said she will need to call back and schedule an appointment. The number to contact patient is [phone]

## 2018-09-19 NOTE — Telephone Encounter (Signed)
Shawn please advise, thank you. Patient called back and stated that she needed something for muscle spasm. Tizanidine 4 mg and the Tramadol specifically.

## 2018-09-19 NOTE — Telephone Encounter (Signed)
She will need to try the Diclofenac first, but should not be taking the Prednisone at the same time.

## 2018-09-19 NOTE — Telephone Encounter (Signed)
Sent in prescription for tizanidine 4 mg TID, but will not prescribe the Tramadol . Patient needs to come in for appointment.

## 2018-09-19 NOTE — Telephone Encounter (Signed)
Please refer to other notes, thank you. Patient needs to make an appt to be seen.

## 2018-09-19 NOTE — Telephone Encounter (Signed)
Shawn please advise, thank you 

## 2018-09-19 NOTE — Telephone Encounter (Signed)
Patient was called and lvm to inform her of Rx and to call us to schedule an appt to come in.

## 2018-09-22 ENCOUNTER — Telehealth: Payer: Self-pay | Admitting: Orthopedic Surgery

## 2018-09-22 ENCOUNTER — Telehealth: Payer: Self-pay

## 2018-09-22 NOTE — Telephone Encounter (Signed)
Patient called again concerning a stronger Rx to take and stated for Rx to be sent to a pharmacy in [facility].

## 2018-09-22 NOTE — Telephone Encounter (Signed)
Patient called left voicemail message stating medication is not strong enough. She said she is taking medication 3 times a day with food. The number to contact patient is [phone]

## 2018-09-23 ENCOUNTER — Telehealth: Payer: Self-pay | Admitting: Orthopedic Surgery

## 2018-09-23 ENCOUNTER — Telehealth: Payer: Self-pay

## 2018-09-23 NOTE — Telephone Encounter (Signed)
Pt calls multiple times a day states that she is unable to walk and that her legs are weak and that she is in terrible pain. She is requesting "stronger pain medication" she was given Prednisone 20 mg and was to decrease to 10 mg after one week. She was also give rx for zanaflex. Per the last dictation it was suggested that the pt may require bilat total hips do you think we should make the referral to CB to discuss if this is an option for her? Pt is in such discomfort that she is requesting an home health nurse and aid to help her with ADLs in the home.

## 2018-09-23 NOTE — Telephone Encounter (Signed)
Patient called and requested [place] pt stated her leg is not getting any better. Hoping Sharol Given can write an order. She thinks PT may help her get better.  Please call [phone]

## 2018-09-23 NOTE — Telephone Encounter (Signed)
This has been addressed.

## 2018-09-23 NOTE — Telephone Encounter (Signed)
I called and sw the pt. I cancelled the appt with Dr. Sharol Given and resch with Dr. Ninfa Linden to discuss treatment options for arthritic hips. appt made for 7/29 at 10:15

## 2018-09-23 NOTE — Telephone Encounter (Signed)
Yes, she had bad OA of both hips, lets get her in to see [facility]

## 2018-09-23 NOTE — Telephone Encounter (Signed)
Patient asked if the Rx can be called into the [facility] on [address] [phone]   Patient said she has someone that can pick up the Rx. Patient asked if she can get someone to come to her home for (PT)? See previous note.  The number to contact patient is [phone]

## 2018-09-23 NOTE — Telephone Encounter (Signed)
Please send Rx to [facility].

## 2018-09-24 ENCOUNTER — Telehealth: Payer: Self-pay | Admitting: Orthopedic Surgery

## 2018-09-24 NOTE — Telephone Encounter (Signed)
Patient called asked if she can get a small shower chair for her bathroom. Patient asked if the Rx can be  Faxed to [employer]. Patient said she weight 125LBS. The number to contact patient is [phone]

## 2018-09-24 NOTE — Telephone Encounter (Signed)
Patient's form with demo and last office note has been faxed to [employer].

## 2018-09-24 NOTE — Telephone Encounter (Signed)
Talked with pt yesterday and referral to Dr. Ninfa Linden and no further medications until she is able to discuss treatment options with him next week.

## 2018-09-26 ENCOUNTER — Telehealth: Payer: Self-pay | Admitting: Orthopedic Surgery

## 2018-09-26 NOTE — Telephone Encounter (Signed)
I called and sw pt and advised that per Dr. Sharol Given we can not write for anything else but she does have the prednisone and the zanaflex and the appt with Dr. Ninfa Linden next Wednesday. Advised the pt that I know she is having pain and that we want to help and see what Dr. Ninfa Linden suggests in order to help with this. She voiced understanding and will call with any other questions.

## 2018-09-26 NOTE — Telephone Encounter (Signed)
Patient called and stated need something for pain for leg(left) can't hardly pick up. Dragging leg.  Please call patient at [phone]

## 2018-09-28 ENCOUNTER — Other Ambulatory Visit (INDEPENDENT_AMBULATORY_CARE_PROVIDER_SITE_OTHER): Payer: Self-pay | Admitting: Orthopedic Surgery

## 2018-09-29 ENCOUNTER — Other Ambulatory Visit: Payer: Self-pay

## 2018-09-29 ENCOUNTER — Ambulatory Visit: Payer: Medicare HMO | Admitting: Physician Assistant

## 2018-09-29 ENCOUNTER — Telehealth: Payer: Self-pay

## 2018-09-29 ENCOUNTER — Telehealth: Payer: Self-pay | Admitting: Orthopaedic Surgery

## 2018-09-29 ENCOUNTER — Emergency Department (HOSPITAL_COMMUNITY)
Admission: EM | Admit: 2018-09-29 | Discharge: 2018-09-29 | Disposition: A | Discharge status: Home / Self Care | Payer: Medicare HMO | Attending: Emergency Medicine | Admitting: Emergency Medicine

## 2018-09-29 ENCOUNTER — Emergency Department (HOSPITAL_COMMUNITY): Payer: Medicare HMO

## 2018-09-29 ENCOUNTER — Encounter (HOSPITAL_COMMUNITY): Payer: Self-pay | Admitting: Emergency Medicine

## 2018-09-29 DIAGNOSIS — E119 Type 2 diabetes mellitus without complications: Secondary | ICD-10-CM | POA: Insufficient documentation

## 2018-09-29 DIAGNOSIS — I10 Essential (primary) hypertension: Secondary | ICD-10-CM | POA: Insufficient documentation

## 2018-09-29 DIAGNOSIS — F17210 Nicotine dependence, cigarettes, uncomplicated: Secondary | ICD-10-CM | POA: Diagnosis not present

## 2018-09-29 DIAGNOSIS — Z7982 Long term (current) use of aspirin: Secondary | ICD-10-CM | POA: Diagnosis not present

## 2018-09-29 DIAGNOSIS — Z79899 Other long term (current) drug therapy: Secondary | ICD-10-CM | POA: Diagnosis not present

## 2018-09-29 DIAGNOSIS — Z7984 Long term (current) use of oral hypoglycemic drugs: Secondary | ICD-10-CM | POA: Diagnosis not present

## 2018-09-29 DIAGNOSIS — M79605 Pain in left leg: Secondary | ICD-10-CM | POA: Insufficient documentation

## 2018-09-29 DIAGNOSIS — R52 Pain, unspecified: Secondary | ICD-10-CM

## 2018-09-29 DIAGNOSIS — M160 Bilateral primary osteoarthritis of hip: Secondary | ICD-10-CM | POA: Diagnosis not present

## 2018-09-29 NOTE — Discharge Instructions (Signed)
You were seen in the ER for left leg pain  X-rays today show severe arthritis of your hips (both) but worse on the left and arthritis of your left knee.  These are likely the cause of your pain.   You have prescription for muscle relaxer (tizanidine), prednisone, and hydrocodone. Take these as prescribed.  Use knee sleeve to help with stability. Rest.   Go to your appointment with Dr Ninfa Linden to discuss more option of treatment for pain control.

## 2018-09-29 NOTE — Telephone Encounter (Signed)
See below

## 2018-09-29 NOTE — ED Provider Notes (Addendum)
[facility] EMERGENCY DEPARTMENT Provider Note   CSN: [id number] Arrival date & time: 09/29/18  0539    History   Chief Complaint Chief Complaint  Patient presents with  . Leg Pain    HPI Jacqueline Reese is a 63 y.o. female with history of CAD, diabetes, chronic pain managed by pain clinic in [facility], presents to the ER for evaluation of leg pain.  Onset several weeks ago, acutely worsening for the last 1 week.  Describes "spasm pain" that begins in her left buttock and radiates down to her foot.  She has pain in her groin. The pain is moderate to severe.  It is intermittent, and is tolerable at rest but significant when she tries to move, stand up and walk.  Any movement, weightbearing and bending at the waist worsens the pain.  Has also felt "popping" in her muscles when walking.  She uses a cane to ambulate but states this is becoming more difficult.  She is followed by a pain clinic in [place] who has prescribed her hydrocodone on 7/17 for 30 days.  States she sometimes forgets to take it because she does not know if it is safe to take it with all her other medicines.  Is followed by Dr. Sharol Given with orthopedics who saw her on 7/2 for bilateral leg pain.  She has tried several oral OTC medicines, muscle relaxers, prednisone without significant relief.  She denies any recent falls, trauma.  Denies any recent changes in activity.  She denies any leg swelling, loss of sensation, tingling.  No saddle anesthesia, loss of bladder or bowel control, weakness but states that moving her left leg is harder due to the pain.     HPI  Past Medical History:  Diagnosis Date  . Arthritis   . Chronic back pain   . Depression   . Diabetes mellitus without complication ([place])   . Gout   . Hypertension   . Schizophrenic disorder [facility])     Patient Active Problem List   Diagnosis Date Noted  . Angioedema 09/13/2015  . Anaphylaxis   . Hypertension, essential   . Acute  hypoxemic respiratory failure ([place])   . Type 2 diabetes mellitus without complication, without long-term current use of insulin ([place])   . DIAB W/O MENTION COMP TYPE II/UNS TYPE UNCNTRL 01/19/2010  . FOOT ULCER, RIGHT 01/19/2010  . MENOPAUSE-RELATED VASOMOTOR SYMPTOMS, HOT FLASHES 10/04/2009  . GOUT, UNSPECIFIED 06/07/2009  . LICHEN SIMPLEX CHRONICUS 11/30/2008  . OTITIS EXTERNA, CHRONIC 04/16/2008  . OBESITY, NOS 05/02/2006  . SCHIZOPHRENIA 05/02/2006  . HYPERTENSION, BENIGN SYSTEMIC 05/02/2006  . GASTROESOPHAGEAL REFLUX, NO ESOPHAGITIS 05/02/2006    Past Surgical History:  Procedure Laterality Date  . BREAST BIOPSY Left    core biopsy  . CESAREAN SECTION     x2  . NASAL ENDOSCOPY Left 09/13/2015   Procedure: NASAL ENDOSCOPY WITH NASAL INTABATION;  Surgeon: Jerrell Belfast, MD;  Location: [facility];  Service: ENT;  Laterality: Left;     OB History   No obstetric history on file.      Home Medications    Prior to Admission medications   Medication Sig Start Date End Date Taking? Authorizing Provider  acetaminophen (TYLENOL) 500 MG tablet Take 1,500 mg by mouth every 6 (six) hours as needed for mild pain.    [provider]  aspirin EC 325 MG tablet Take 325 mg by mouth daily.    [provider]  clonazePAM (KLONOPIN) 1 MG  tablet Take 1 mg by mouth 3 (three) times daily.     [provider]  CYCLOBENZAPRINE HCL PO Take by mouth.    [provider]  diclofenac (VOLTAREN) 75 MG EC tablet TAKE 1 TABLET BY MOUTH TWICE DAILY WITH FOOD AS NEEDED 09/17/18   Newt Minion, MD  diclofenac sodium (VOLTAREN) 1 % GEL Apply 2 g topically 4 (four) times daily. 07/27/18   Lamptey, Myrene Galas, MD  gabapentin (NEURONTIN) 300 MG capsule Take 2 capsules (600 mg total) by mouth 3 (three) times daily. 12/27/17   Newt Minion, MD  HYDROcodone-acetaminophen (NORCO) 10-325 MG tablet Take 1 tablet by mouth every 6 (six) hours as needed for pain. 10/16/17   [provider]  LINZESS 145 MCG CAPS capsule Take 145 mcg by mouth every 14 (fourteen) days. 09/14/17   [provider]  meloxicam (MOBIC) 15 MG tablet Take 1 tablet (15 mg total) by mouth daily. 07/13/18   Robyn Haber, MD  metFORMIN (GLUCOPHAGE) 500 MG tablet Take 500 mg by mouth 2 (two) times daily with a meal.    [provider]  potassium chloride SA (K-DUR,KLOR-CON) 20 MEQ tablet Take 1 tablet (20 mEq total) by mouth 2 (two) times daily for 3 days. 10/24/17 10/27/17  Langston Masker B, PA-C  predniSONE (DELTASONE) 10 MG tablet Take 1 tablet (10 mg total) by mouth daily with breakfast. 09/04/18   Rayburn, Neta Mends, PA-C  Rosuvastatin Calcium (CRESTOR PO) Take by mouth.    [provider]  spironolactone (ALDACTONE) 50 MG tablet Take 50 mg by mouth daily.    [provider]  tiZANidine (ZANAFLEX) 4 MG tablet Take 1 tablet (4 mg total) by mouth 3 (three) times daily. 09/19/18 09/19/19  Rayburn, Neta Mends, PA-C  ziprasidone (GEODON) 80 MG capsule Take 160 mg by mouth every evening.  07/13/15   [provider]    Family History Family History  Problem Relation Age of Onset  . Diabetes Mother     Social History Social History   Tobacco Use  . Smoking status: Current Every Day Smoker    Packs/day: 0.33    Types: Cigarettes  . Smokeless tobacco: Never Used  Substance Use Topics  . Alcohol use: No  . Drug use: No     Allergies   Lisinopril, Chantix [varenicline], Doxycycline, Ibuprofen, Lamisil [terbinafine], Cephalexin, and Sulfa antibiotics   Review of Systems Review of Systems  Musculoskeletal: Positive for arthralgias, back pain, gait problem and myalgias.       Crepitus   All other systems reviewed and are negative.    Physical Exam Updated Vital Signs BP 123/87 (BP Location: Right Arm)   Pulse 100   Temp 98.8 F (37.1 C) (Oral)   Resp 16   LMP 05/09/2010   SpO2 98%   Physical Exam Constitutional:       Appearance: She is well-developed.  HENT:     Head: Normocephalic.     Nose: Nose normal.  Eyes:     General: Lids are normal.  Neck:     Musculoskeletal: Normal range of motion.  Cardiovascular:     Rate and Rhythm: Normal rate.     Comments: 1+ radial and DP pulses bilaterally. No calf asymmetric edema or tenderness  Pulmonary:     Effort: Pulmonary effort is normal. No respiratory distress.  Abdominal:     General: Abdomen is flat.     Tenderness: There is no abdominal tenderness.  Comments: No suprapubic or CVA tenderness   Musculoskeletal: Normal range of motion.     Comments: TL spine: no midline or paraspinal muscle tenderness  Pelvis: no instability with AP/L compression. No tenderness to bony prominences of hips bilaterally. Limited ROM of hips bilaterally, pain with IR/ER bilaterally. Negative SLR bilaterally.  Left knee: diffuse medial/lateral joint line tenderness, no obvious joint edema, erythema, warmth, fluctuance. Crepitus and pain with any ROM of knee. Negative drawer. No significant varus/valgus laxity.   Skin:    Comments: Skin well perfused to lower extremities   Neurological:     Mental Status: She is alert.     Comments: Sensation intact in lower extremities bilaterally Strength against resistance 5/5 in lower extremities (hip flexion/extension/abd/add , knee flexion/extension, ankle plantar flexion/dorsiflexion)  Psychiatric:        Behavior: Behavior normal.      ED Treatments / Results  Labs (all labs ordered are listed, but only abnormal results are displayed) Labs Reviewed - No data to display  EKG None  Radiology Dg Knee 1-2 Views Left  Result Date: 09/29/2018 CLINICAL DATA:  NO INJURY,CHRONIC HIP/KNEE PAIN,UNABLE TO STRAIGHTEN OUT LEFT KNEE EXAM: LEFT KNEE - 1-2 VIEW COMPARISON:  None. FINDINGS: There are degenerative changes in the patellofemoral compartment. No acute fracture or subluxation. No joint effusion. IMPRESSION: No evidence for  acute abnormality. Electronically Signed   By: Nolon Nations M.D.   On: 09/29/2018 09:01   Dg Hip Unilat W Or Wo Pelvis 2-3 Views Left  Result Date: 09/29/2018 CLINICAL DATA:  Chronic left hip pain. EXAM: DG HIP (WITH OR WITHOUT PELVIS) 2-3V LEFT COMPARISON:  Plain films of the pelvis and left hip 12/13/2010. FINDINGS: The patient has severe bilateral hip osteoarthritis with bone-on-bone joint space narrowing, remodeling of the femoral heads and extensive subchondral sclerosis. 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 Degenerative change appears slightly worse on the left. The appearance is much worse than on the prior plain films. Mild degenerative disease about the SI joints is noted. IMPRESSION: Severe bilateral hip osteoarthritis has markedly worsened since 2012 and is slightly more advanced on the left. Electronically Signed   By: Inge Rise M.D.   On: 09/29/2018 09:20    Procedures Procedures (including critical care time)  Medications Ordered in ED Medications - No data to display   Initial Impression / Assessment and Plan / ED Course  I have reviewed the triage vital signs and the nursing notes.  Pertinent labs & imaging results that were available during my care of the patient were reviewed by me and considered in my medical decision making (see chart for details).  Clinical Course as of Sep 29 927  Mon Sep 29, 2018  0858 Spoke to tech Sharee Pimple who changed x-ray order from 4 view knee to 2 view, states pt could not straighten knee.    [CG]  [id number] The patient has severe bilateral hip osteoarthritis with bone-on-bone joint space narrowing, remodeling of the femoral heads and extensive subchondral sclerosis. 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 Degenerative change appears slightly worse on the left. The appearance is much worse than on the prior plain films. Mild degenerative disease about the SI joints is noted.  DG Hip Unilat W or Wo Pelvis 2-3 Views Left [CG]     Clinical Course User Index [CG] Kinnie Feil, PA-C    I have reviewed patient's EMR to obtained pertinent PMH. She is followed by Dr Sharol Given for bilateral leg pain. Last visit earlier this month.  Lumbar x-ray this month shows L5 on L5 anterior listhesis, bilateral hip arthritis.   ddx includes MSK strain vs spasm vs low back etiology such as pinched nerve.  She has left knee pain and crepitus with ROM on exam, pain in left hip with IR/ER and hip/knee etiology could be contributing.  She has no low back, SLR on exam. No saddle anesthesia, numbness, tingling, neuro or pulse deficits on exam and very unlikely to be cauda equina. Doubt epidural abscess, vascular pathology. No overlaying signs of knee infection. No asymmetric LE edema, calf tenderness and DVT unlikely.  Imaging reviewed by me. Arthritis changes noted without acute abnormalities. Severe worsening bilateral L>R hip and SI joint OA. This fits patient's pain and exam.   Pt given knee sleeve for support.  She states she has prednisone at home which could help nerve related etiology. She has f/u with orthopedist on Wednesday. Has done OP PT, already on pain management and further long term management of chronic pain referred to primary ortho/pain team. Recent rx for hydrocodone and tizanidine given to her. Return precautions given. Pt comfortable with this.  Final Clinical Impressions(s) / ED Diagnoses   Final diagnoses:  Left leg pain  Bilateral primary osteoarthritis of hip    ED Discharge Orders    None       Kinnie Feil, PA-C 09/29/18 1610    Blanchie Dessert, MD 09/29/18 2101

## 2018-09-29 NOTE — Telephone Encounter (Signed)
Patient aware of the below message  

## 2018-09-29 NOTE — Progress Notes (Signed)
Orthopedic Tech Progress Note Patient Details:  Jacqueline Reese 1955-12-10 [id number]  Ortho Devices Type of Ortho Device: Knee Sleeve Ortho Device/Splint Location: left Ortho Device/Splint Interventions: Application   Post Interventions Patient Tolerated: Well Instructions Provided: Care of device   [place] Pink 09/29/2018, 9:44 AM

## 2018-09-29 NOTE — Telephone Encounter (Signed)
FYI- We have repeatedly told the pt that we are going to defer on pain medication at this time. She has an appt for bilat hip to discuss joint replacements ( they are badly arthritic hips) just FYI there seems to be a cognitive issue she is a very sweet lady but is aware that we are not going to give rx for pain. She has rx for prednisone and zanaflex and tht is all we can do right now.

## 2018-09-29 NOTE — Telephone Encounter (Signed)
I have not seen this patient before but apparently will be seeing her soon.  Since I have not initiated contact with her I feel that it would be more appropriate for someone on yall's side to potentially provide a medication for pain for her.

## 2018-09-29 NOTE — Telephone Encounter (Signed)
Patient called wanting to know if Dr. Ninfa Linden could prescribe her a Rx for her leg pain before her appointment on Wednesday, 10/01/2018.   Stated that she will need a Rx in order to make it to her appt. Patient was seen at the ED today.  Cb# is [phone].  Please advise.  Thank you.

## 2018-09-29 NOTE — ED Triage Notes (Signed)
Left leg spasms x 1 week saw her dr  Last week and given tramdol  But it is not helping  She states  Entire leg knee, calf hurts to walk, uses a cane

## 2018-09-29 NOTE — Telephone Encounter (Signed)
Patient called and stated that she ended up in ED this weekend. She can;t walk at all. She is currently taking Tramadol and Prednisone and nothing is helping her. She states she needs something for her muscles they are giving out on her.  Please call her @ [phone]

## 2018-10-01 ENCOUNTER — Ambulatory Visit: Payer: Medicare HMO | Admitting: Orthopaedic Surgery

## 2018-10-02 ENCOUNTER — Other Ambulatory Visit: Payer: Self-pay

## 2018-10-02 ENCOUNTER — Telehealth: Payer: Self-pay | Admitting: Orthopedic Surgery

## 2018-10-02 ENCOUNTER — Ambulatory Visit: Payer: Medicare HMO | Admitting: Orthopedic Surgery

## 2018-10-02 NOTE — Telephone Encounter (Signed)
Patient called asked if she can get someone to come help her at home until she can walk again. Patient asked if the order can be faxed to [facility]. The fax# is [phone]   The number to contact patient is [phone]

## 2018-10-02 NOTE — Telephone Encounter (Signed)
Called pt to advise order has been faxed.

## 2018-10-02 NOTE — Telephone Encounter (Signed)
Order written I will fax with demographic sheet and see if Jacqueline Reese is able to pick up the pt as a new referral.

## 2018-10-06 ENCOUNTER — Telehealth: Payer: Self-pay | Admitting: Physician Assistant

## 2018-10-06 NOTE — Telephone Encounter (Signed)
Duda  °

## 2018-10-06 NOTE — Telephone Encounter (Signed)
Patient has called and stated in so much pain she can't walk and need something to take.  Please call patient to advise.  [phone]

## 2018-10-06 NOTE — Telephone Encounter (Signed)
Can you please advise?

## 2018-10-06 NOTE — Telephone Encounter (Signed)
Per note in chart, Jacqueline Reese has spoken with patient and advised they are not filling pain medication for her. Patient is scheduled to see Benita Stabile on 10/08/2018 for bilateral hip pain. He is unable to prescribe any pain medication for the patient as he has never seen her.   I called patient and advised.

## 2018-10-08 ENCOUNTER — Encounter: Payer: Self-pay | Admitting: Physician Assistant

## 2018-10-08 ENCOUNTER — Encounter (HOSPITAL_COMMUNITY): Payer: Self-pay | Admitting: Emergency Medicine

## 2018-10-08 ENCOUNTER — Other Ambulatory Visit: Payer: Self-pay

## 2018-10-08 ENCOUNTER — Ambulatory Visit (INDEPENDENT_AMBULATORY_CARE_PROVIDER_SITE_OTHER)
Admission: EM | Admit: 2018-10-08 | Discharge: 2018-10-08 | Disposition: A | Discharge status: Home / Self Care | Payer: Medicare Other | Source: Home / Self Care

## 2018-10-08 ENCOUNTER — Telehealth: Payer: Self-pay | Admitting: Orthopedic Surgery

## 2018-10-08 ENCOUNTER — Ambulatory Visit (INDEPENDENT_AMBULATORY_CARE_PROVIDER_SITE_OTHER): Payer: Medicare HMO | Admitting: Physician Assistant

## 2018-10-08 DIAGNOSIS — M1612 Unilateral primary osteoarthritis, left hip: Secondary | ICD-10-CM

## 2018-10-08 DIAGNOSIS — M25561 Pain in right knee: Secondary | ICD-10-CM | POA: Diagnosis not present

## 2018-10-08 DIAGNOSIS — M1611 Unilateral primary osteoarthritis, right hip: Secondary | ICD-10-CM | POA: Insufficient documentation

## 2018-10-08 DIAGNOSIS — M160 Bilateral primary osteoarthritis of hip: Secondary | ICD-10-CM | POA: Diagnosis not present

## 2018-10-08 DIAGNOSIS — M25562 Pain in left knee: Secondary | ICD-10-CM

## 2018-10-08 NOTE — ED Provider Notes (Signed)
[place]    CSN: [id number] Arrival date & time: 10/08/18  1030     History   Chief Complaint Chief Complaint  Patient presents with  . Leg Swelling    HPI Jacqueline Reese is a 63 y.o. female.   Patient presents with ongoing chronic bilateral leg pain.  She denies injury or fall.  She is here today requesting additional pain medication.  Patient is followed by a pain clinic in [place] and was seen by an orthopedic earlier today.  She has multiple visits at multiple providers over the past several months.  Patient denies saddle anesthesia, bowel or bladder incontinence, numbness.  Pertinent medical history: Schizophrenia, gout, arthritis.  The history is provided by the patient.    Past Medical History:  Diagnosis Date  . Arthritis   . Chronic back pain   . Depression   . Diabetes mellitus without complication ([place])   . Gout   . Hypertension   . Schizophrenic disorder [facility])     Patient Active Problem List   Diagnosis Date Noted  . Angioedema 09/13/2015  . Anaphylaxis   . Hypertension, essential   . Acute hypoxemic respiratory failure ([place])   . Type 2 diabetes mellitus without complication, without long-term current use of insulin ([place])   . DIAB W/O MENTION COMP TYPE II/UNS TYPE UNCNTRL 01/19/2010  . FOOT ULCER, RIGHT 01/19/2010  . MENOPAUSE-RELATED VASOMOTOR SYMPTOMS, HOT FLASHES 10/04/2009  . GOUT, UNSPECIFIED 06/07/2009  . LICHEN SIMPLEX CHRONICUS 11/30/2008  . OTITIS EXTERNA, CHRONIC 04/16/2008  . OBESITY, NOS 05/02/2006  . SCHIZOPHRENIA 05/02/2006  . HYPERTENSION, BENIGN SYSTEMIC 05/02/2006  . GASTROESOPHAGEAL REFLUX, NO ESOPHAGITIS 05/02/2006    Past Surgical History:  Procedure Laterality Date  . BREAST BIOPSY Left    core biopsy  . CESAREAN SECTION     x2  . NASAL ENDOSCOPY Left 09/13/2015   Procedure: NASAL ENDOSCOPY WITH NASAL INTABATION;  Surgeon: Jerrell Belfast, MD;  Location: [place];  Service: ENT;  Laterality: Left;    OB  History   No obstetric history on file.      Home Medications    Prior to Admission medications   Medication Sig Start Date End Date Taking? Authorizing Provider  acetaminophen (TYLENOL) 500 MG tablet Take 1,500 mg by mouth every 6 (six) hours as needed for mild pain.    [provider]  allopurinol (ZYLOPRIM) 100 MG tablet TAKE 1 TABLET(100 MG) BY MOUTH TWICE DAILY 09/29/18   Newt Minion, MD  aspirin EC 325 MG tablet Take 325 mg by mouth daily.    [provider]  clonazePAM (KLONOPIN) 1 MG tablet Take 1 mg by mouth 3 (three) times daily.     [provider]  CYCLOBENZAPRINE HCL PO Take by mouth.    [provider]  diclofenac (VOLTAREN) 75 MG EC tablet TAKE 1 TABLET BY MOUTH TWICE DAILY WITH FOOD AS NEEDED 09/17/18   Newt Minion, MD  diclofenac sodium (VOLTAREN) 1 % GEL Apply 2 g topically 4 (four) times daily. 07/27/18   Lamptey, Myrene Galas, MD  gabapentin (NEURONTIN) 300 MG capsule Take 2 capsules (600 mg total) by mouth 3 (three) times daily. 12/27/17   Newt Minion, MD  HYDROcodone-acetaminophen (NORCO) 10-325 MG tablet Take 1 tablet by mouth every 6 (six) hours as needed for pain. 10/16/17   [provider]  LINZESS 145 MCG CAPS capsule Take 145 mcg by mouth every 14 (fourteen) days. 09/14/17   [provider]  meloxicam (MOBIC) 15 MG tablet Take 1 tablet (15 mg total) by mouth daily. 07/13/18   Robyn Haber, MD  metFORMIN (GLUCOPHAGE) 500 MG tablet Take 500 mg by mouth 2 (two) times daily with a meal.    [provider]  potassium chloride SA (K-DUR,KLOR-CON) 20 MEQ tablet Take 1 tablet (20 mEq total) by mouth 2 (two) times daily for 3 days. 10/24/17 10/27/17  Langston Masker B, PA-C  predniSONE (DELTASONE) 10 MG tablet Take 1 tablet (10 mg total) by mouth daily with breakfast. 09/04/18   Rayburn, Neta Mends, PA-C  Rosuvastatin Calcium (CRESTOR PO) Take by mouth.    [provider]  spironolactone  (ALDACTONE) 50 MG tablet Take 50 mg by mouth daily.    [provider]  tiZANidine (ZANAFLEX) 4 MG tablet Take 1 tablet (4 mg total) by mouth 3 (three) times daily. 09/19/18 09/19/19  Rayburn, Neta Mends, PA-C  ziprasidone (GEODON) 80 MG capsule Take 160 mg by mouth every evening.  07/13/15   [provider]    Family History Family History  Problem Relation Age of Onset  . Diabetes Mother     Social History Social History   Tobacco Use  . Smoking status: Current Every Day Smoker    Packs/day: 0.33    Types: Cigarettes  . Smokeless tobacco: Never Used  Substance Use Topics  . Alcohol use: No  . Drug use: No     Allergies   Lisinopril, Chantix [varenicline], Doxycycline, Ibuprofen, Lamisil [terbinafine], Cephalexin, and Sulfa antibiotics   Review of Systems Review of Systems  Constitutional: Negative for chills and fever.  HENT: Negative for ear pain and sore throat.   Eyes: Negative for pain and visual disturbance.  Respiratory: Negative for cough and shortness of breath.   Cardiovascular: Negative for chest pain and palpitations.  Gastrointestinal: Negative for abdominal pain and vomiting.  Genitourinary: Negative for dysuria and hematuria.  Musculoskeletal: Positive for arthralgias. Negative for back pain.  Skin: Negative for color change and rash.  Neurological: Negative for seizures, syncope and numbness.  All other systems reviewed and are negative.    Physical Exam Triage Vital Signs ED Triage Vitals  Enc Vitals Group     BP      Pulse      Resp      Temp      Temp src      SpO2      Weight      Height      Head Circumference      Peak Flow      Pain Score      Pain Loc      Pain Edu?      Excl. in [place]?    No data found.  Updated Vital Signs BP 118/89 (BP Location: Right Arm)   Pulse 99   Temp 98.6 F (37 C) (Oral)   Resp 18   LMP 05/09/2010   SpO2 100%   Visual Acuity Right Eye Distance:   Left Eye Distance:    Bilateral Distance:    Right Eye Near:   Left Eye Near:    Bilateral Near:     Physical Exam Vitals signs and nursing note reviewed.  Constitutional:      General: She is not in acute distress.    Appearance: She is well-developed.  HENT:     Head: Normocephalic and atraumatic.  Eyes:     Conjunctiva/sclera: Conjunctivae normal.  Neck:     Musculoskeletal: Neck supple.  Cardiovascular:     Rate and Rhythm: Normal rate and regular rhythm.     Heart sounds: No murmur.  Pulmonary:     Effort: Pulmonary effort is normal. No respiratory distress.     Breath sounds: Normal breath sounds.  Abdominal:     Palpations: Abdomen is soft.     Tenderness: There is no abdominal tenderness.  Musculoskeletal:        General: No deformity.     Right lower leg: Edema present.     Left lower leg: Edema present.     Comments: Edema L>R.  Skin:    General: Skin is warm and dry.     Findings: No bruising, erythema, lesion or rash.  Neurological:     General: No focal deficit present.     Mental Status: She is alert.     Sensory: No sensory deficit.     Motor: No weakness.      UC Treatments / Results  Labs (all labs ordered are listed, but only abnormal results are displayed) Labs Reviewed - No data to display  EKG   Radiology No results found.  Procedures Procedures (including critical care time)  Medications Ordered in UC Medications - No data to display  Initial Impression / Assessment and Plan / UC Course  I have reviewed the triage vital signs and the nursing notes.  Pertinent labs & imaging results that were available during my care of the patient were reviewed by me and considered in my medical decision making (see chart for details).   Bilateral leg pain.  Patient is in no acute distress at this time.  She was seen by the orthopedic earlier this morning.  She is followed by a pain clinic.  Discussed with patient at length that she needed to follow-up with her PCP or  with her pain clinic or with her orthopedist if she needs additional pain medication.  Patient received #120 Norco on 09/17/2018 and #90 Zanaflex on 09/19/2018.  Discussed with patient that she should go to the emergency department if her pain becomes uncontrolled, if she develops saddle anesthesia, bowel or bladder incontinence, or other symptoms.     Final Clinical Impressions(s) / UC Diagnoses   Final diagnoses:  Arthralgia of both lower legs     Discharge Instructions     Follow-up with the pain clinic or with your orthopedist to discuss your ongoing leg pain.    Go to the emergency department if your pain is uncontrolled or if you develop numbness in your legs, lose control of your bowel or bladder, or have other concerning symptoms.        ED Prescriptions    None     Controlled Substance Prescriptions [place] Controlled Substance Registry consulted? Yes, I have consulted the [place] Controlled Substances Registry for this patient, and feel the risk/benefit ratio today is favorable for proceeding with this prescription for a controlled substance.   Sharion Balloon, NP 10/08/18 1121

## 2018-10-08 NOTE — Discharge Instructions (Addendum)
Follow-up with the pain clinic or with your orthopedist to discuss your ongoing leg pain.    Go to the emergency department if your pain is uncontrolled or if you develop numbness in your legs, lose control of your bowel or bladder, or have other concerning symptoms.

## 2018-10-08 NOTE — Telephone Encounter (Signed)
Patient left a voicemail message stating that she did not like Artis Delay and that she does not want to have surgery and that she needs pain medication.  EL#[phone].  Thank you.

## 2018-10-08 NOTE — Progress Notes (Signed)
Office Visit Note   Patient: Jacqueline Reese           Date of Birth: [birth year]           MRN: [id number] Visit Date: 10/08/2018              Requested by: Nolene Ebbs, MD [address] [place],  [place] [address] PCP: Nolene Ebbs, MD   Assessment & Plan: Visit Diagnoses:  1. Primary osteoarthritis of right hip   2. Primary osteoarthritis of left hip     Plan:  Patient does not wish to have any type of surgical intervention.  She will continue to get her pain medication from pain management .  A prescription is written  for a wheelchair.  I explained to her that both of her hips are severely arthritic and that she could benefit from total hip arthroplasties.  Also that there are concerns her hip arthritis could cause her to fall and fracture either hip or wrist.  She will follow-up with [place] on as-needed basis.  I do not feel is appropriate to keep her on long-term prednisone and told her this due to her diabetes and the fact that the treatment for bilateral hip arthritis is not chronic prednisone.  Questions were encouraged and answered.   Follow-Up Instructions: Return if symptoms worsen or fail to improve.   Orders:  No orders of the defined types were placed in this encounter.  No orders of the defined types were placed in this encounter.     Procedures: No procedures performed   Clinical Data: No additional findings.   Subjective: Chief Complaint  Patient presents with  . Left Hip - Pain    HPI Patient is a 63 year old female comes in today with bilateral hip pain.  States she has difficulty walking.  Seen by Dr. Sharol Given radiographs are available on epic and show severe end-stage arthritis of both hips.  Both hips are misshapen.  Chondrocalcinosis versus gouty changes involving both hip joints.  No acute fractures.  These films were interpreted by myself.  Patient is diabetic.  She is requesting prednisone she recently was given prednisone and is finished  that course.  She is also requesting pain medications but reports being in pain management.  She states she does not want any type of surgical intervention.  She states she is having severe pain in both legs extreme difficulty getting around due to her back and hips.  Medical history also pertinent for gout, depression, schizophrenia, gastric reflux and hypertension.  Review of Systems No fevers or chills.  Please see HPI otherwise negative  Objective: Vital Signs: LMP 05/09/2010   Physical Exam Constitutional:      Appearance: She is not ill-appearing or diaphoretic.  Pulmonary:     Effort: Pulmonary effort is normal.     Ortho Exam Bilateral hips any attempted range of motion causes pain she has guarding decreased range of motion of both hips.  Calves are supple nontender bilaterally. Specialty Comments:  No specialty comments available.  Imaging: No results found.   PMFS History: Patient Active Problem List   Diagnosis Date Noted  . Primary osteoarthritis of left hip 10/08/2018  . Primary osteoarthritis of right hip 10/08/2018  . Angioedema 09/13/2015  . Anaphylaxis   . Hypertension, essential   . Acute hypoxemic respiratory failure ([place])   . Type 2 diabetes mellitus without complication, without long-term current use of insulin ([place])   . DIAB W/O MENTION COMP TYPE II/UNS TYPE  UNCNTRL 01/19/2010  . FOOT ULCER, RIGHT 01/19/2010  . MENOPAUSE-RELATED VASOMOTOR SYMPTOMS, HOT FLASHES 10/04/2009  . GOUT, UNSPECIFIED 06/07/2009  . LICHEN SIMPLEX CHRONICUS 11/30/2008  . OTITIS EXTERNA, CHRONIC 04/16/2008  . OBESITY, NOS 05/02/2006  . SCHIZOPHRENIA 05/02/2006  . HYPERTENSION, BENIGN SYSTEMIC 05/02/2006  . GASTROESOPHAGEAL REFLUX, NO ESOPHAGITIS 05/02/2006   Past Medical History:  Diagnosis Date  . Arthritis   . Chronic back pain   . Depression   . Diabetes mellitus without complication ([place])   . Gout   . Hypertension   . Schizophrenic disorder ([place])     Family  History  Problem Relation Age of Onset  . Diabetes Mother     Past Surgical History:  Procedure Laterality Date  . BREAST BIOPSY Left    core biopsy  . CESAREAN SECTION     x2  . NASAL ENDOSCOPY Left 09/13/2015   Procedure: NASAL ENDOSCOPY WITH NASAL INTABATION;  Surgeon: Jerrell Belfast, MD;  Location: [facility];  Service: ENT;  Laterality: Left;   Social History   Occupational History  . Not on file  Tobacco Use  . Smoking status: Current Every Day Smoker    Packs/day: 0.33    Types: Cigarettes  . Smokeless tobacco: Never Used  Substance and Sexual Activity  . Alcohol use: No  . Drug use: No  . Sexual activity: Not on file

## 2018-10-08 NOTE — ED Triage Notes (Signed)
Pt here for leg pain and swelling x 2 weeks

## 2018-10-09 NOTE — Telephone Encounter (Signed)
Called pt and advised of message below.  she states that she wants to know how long the recovery will be from having surgery ( total hip) afraid that she is older and that she will not recover quickly. Wants to know what to expect.

## 2018-10-09 NOTE — Telephone Encounter (Signed)
I am not sure what we can do for this pt.  She was referred to [facility] for bilateral hip replacement and pt does not want surgery, she then went to the ER for pain medication and was denied due to being followed by pain clinic and receives monthly rx for Hydrocodone 7.5/325 #120  Overdose risk 600. not sure what else we can do for her?

## 2018-10-09 NOTE — Telephone Encounter (Signed)
Call patient the only way we can relieve the pain from her hips would be for a total hip replacement.  The narcotic pain medicine will not relieve these symptoms.  We could have her see Dr.Xu

## 2018-10-10 ENCOUNTER — Ambulatory Visit (HOSPITAL_COMMUNITY): Payer: Medicare Other

## 2018-10-10 ENCOUNTER — Emergency Department (HOSPITAL_COMMUNITY): Payer: Medicare Other

## 2018-10-10 ENCOUNTER — Inpatient Hospital Stay (HOSPITAL_COMMUNITY)
Admission: EM | Admit: 2018-10-10 | Discharge: 2018-10-12 | DRG: 872 | Disposition: A | Discharge status: Home Health | Payer: Medicare Other | Attending: Family Medicine | Admitting: Family Medicine

## 2018-10-10 ENCOUNTER — Other Ambulatory Visit: Payer: Self-pay

## 2018-10-10 DIAGNOSIS — L0390 Cellulitis, unspecified: Secondary | ICD-10-CM | POA: Diagnosis present

## 2018-10-10 DIAGNOSIS — Z7982 Long term (current) use of aspirin: Secondary | ICD-10-CM

## 2018-10-10 DIAGNOSIS — M109 Gout, unspecified: Secondary | ICD-10-CM | POA: Diagnosis present

## 2018-10-10 DIAGNOSIS — F329 Major depressive disorder, single episode, unspecified: Secondary | ICD-10-CM | POA: Diagnosis present

## 2018-10-10 DIAGNOSIS — M6281 Muscle weakness (generalized): Secondary | ICD-10-CM | POA: Diagnosis present

## 2018-10-10 DIAGNOSIS — Z833 Family history of diabetes mellitus: Secondary | ICD-10-CM | POA: Diagnosis not present

## 2018-10-10 DIAGNOSIS — F17210 Nicotine dependence, cigarettes, uncomplicated: Secondary | ICD-10-CM | POA: Diagnosis not present

## 2018-10-10 DIAGNOSIS — R198 Other specified symptoms and signs involving the digestive system and abdomen: Secondary | ICD-10-CM

## 2018-10-10 DIAGNOSIS — E119 Type 2 diabetes mellitus without complications: Secondary | ICD-10-CM | POA: Diagnosis present

## 2018-10-10 DIAGNOSIS — Z7952 Long term (current) use of systemic steroids: Secondary | ICD-10-CM

## 2018-10-10 DIAGNOSIS — Z7984 Long term (current) use of oral hypoglycemic drugs: Secondary | ICD-10-CM | POA: Diagnosis not present

## 2018-10-10 DIAGNOSIS — A419 Sepsis, unspecified organism: Secondary | ICD-10-CM | POA: Diagnosis not present

## 2018-10-10 DIAGNOSIS — R748 Abnormal levels of other serum enzymes: Secondary | ICD-10-CM | POA: Diagnosis not present

## 2018-10-10 DIAGNOSIS — F209 Schizophrenia, unspecified: Secondary | ICD-10-CM

## 2018-10-10 DIAGNOSIS — Z20828 Contact with and (suspected) exposure to other viral communicable diseases: Secondary | ICD-10-CM | POA: Diagnosis present

## 2018-10-10 DIAGNOSIS — R52 Pain, unspecified: Secondary | ICD-10-CM | POA: Diagnosis not present

## 2018-10-10 DIAGNOSIS — R609 Edema, unspecified: Secondary | ICD-10-CM | POA: Diagnosis not present

## 2018-10-10 DIAGNOSIS — F419 Anxiety disorder, unspecified: Secondary | ICD-10-CM | POA: Diagnosis not present

## 2018-10-10 DIAGNOSIS — L03116 Cellulitis of left lower limb: Secondary | ICD-10-CM | POA: Diagnosis not present

## 2018-10-10 DIAGNOSIS — I10 Essential (primary) hypertension: Secondary | ICD-10-CM

## 2018-10-10 LAB — BASIC METABOLIC PANEL
Anion gap: 15 (ref 5–15)
BUN: 17 mg/dL (ref 8–23)
CO2: 19 mmol/L — ABNORMAL LOW (ref 22–32)
Calcium: 9.2 mg/dL (ref 8.9–10.3)
Chloride: 104 mmol/L (ref 98–111)
Creatinine, Ser: 0.78 mg/dL (ref 0.44–1.00)
GFR calc Af Amer: >60 mL/min (ref >60)
GFR calc non Af Amer: >60 mL/min (ref >60)
Glucose, Bld: 161 mg/dL — ABNORMAL HIGH (ref 70–99)
Potassium: 3.9 mmol/L (ref 3.5–5.1)
Sodium: 138 mmol/L (ref 135–145)

## 2018-10-10 LAB — CBC WITH DIFFERENTIAL/PLATELET
Abs Immature Granulocytes: 0.78 10*3/uL — ABNORMAL HIGH (ref 0.00–0.07)
Basophils Absolute: 0 10*3/uL (ref 0.0–0.1)
Basophils Relative: 0 %
Eosinophils Absolute: 0 10*3/uL (ref 0.0–0.5)
Eosinophils Relative: 0 %
HCT: 32.7 % — ABNORMAL LOW (ref 36.0–46.0)
Hemoglobin: 9.8 g/dL — ABNORMAL LOW (ref 12.0–15.0)
Immature Granulocytes: 4 %
Lymphocytes Relative: 6 %
Lymphs Abs: 1.2 10*3/uL (ref 0.7–4.0)
MCH: 28.4 pg (ref 26.0–34.0)
MCHC: 30 g/dL (ref 30.0–36.0)
MCV: 94.8 fL (ref 80.0–100.0)
Monocytes Absolute: 0.6 10*3/uL (ref 0.1–1.0)
Monocytes Relative: 3 %
Neutro Abs: 19.5 10*3/uL — ABNORMAL HIGH (ref 1.7–7.7)
Neutrophils Relative %: 87 %
Platelets: 502 10*3/uL — ABNORMAL HIGH (ref 150–400)
RBC: 3.45 MIL/uL — ABNORMAL LOW (ref 3.87–5.11)
RDW: 21.6 % — ABNORMAL HIGH (ref 11.5–15.5)
WBC: 22 10*3/uL — ABNORMAL HIGH (ref 4.0–10.5)
nRBC: 1.7 % — ABNORMAL HIGH (ref 0.0–0.2)

## 2018-10-10 LAB — GLUCOSE, CAPILLARY: Glucose-Capillary: 100 mg/dL — ABNORMAL HIGH (ref 70–99)

## 2018-10-10 LAB — HEPATIC FUNCTION PANEL
ALT: 46 U/L — ABNORMAL HIGH (ref 0–44)
AST: 45 U/L — ABNORMAL HIGH (ref 15–41)
Albumin: 3 g/dL — ABNORMAL LOW (ref 3.5–5.0)
Alkaline Phosphatase: 75 U/L (ref 38–126)
Bilirubin, Direct: 0.2 mg/dL (ref 0.0–0.2)
Indirect Bilirubin: 0.5 mg/dL (ref 0.3–0.9)
Total Bilirubin: 0.7 mg/dL (ref 0.3–1.2)
Total Protein: 5.9 g/dL — ABNORMAL LOW (ref 6.5–8.1)

## 2018-10-10 LAB — LACTIC ACID, PLASMA
Lactic Acid, Venous: 2.3 mmol/L (ref 0.5–1.9)
Lactic Acid, Venous: 2.3 mmol/L (ref 0.5–1.9)

## 2018-10-10 LAB — HEMOGLOBIN A1C
Hgb A1c MFr Bld: 6.9 % — ABNORMAL HIGH (ref 4.8–5.6)
Mean Plasma Glucose: 151.33 mg/dL

## 2018-10-10 LAB — CK: Total CK: 889 U/L — ABNORMAL HIGH (ref 38–234)

## 2018-10-10 MED ORDER — VANCOMYCIN HCL IN DEXTROSE 1-5 GM/200ML-% IV SOLN
1000.0000 mg | Freq: Once | INTRAVENOUS | Status: AC
  Administered 2018-10-10: 17:00:00 1000 mg via INTRAVENOUS
  Filled 2018-10-10: qty 200

## 2018-10-10 MED ORDER — ACETAMINOPHEN 650 MG RE SUPP: 650.0000 mg | Freq: Four times a day (QID) | RECTAL | Status: DC | PRN

## 2018-10-10 MED ORDER — ENOXAPARIN SODIUM 40 MG/0.4ML ~~LOC~~ SOLN
40.0000 mg | SUBCUTANEOUS | Status: DC
  Administered 2018-10-10 – 2018-10-11 (×2): 40 mg via SUBCUTANEOUS
  Filled 2018-10-10 (×2): qty 0.4

## 2018-10-10 MED ORDER — ALLOPURINOL 100 MG PO TABS
100.0000 mg | ORAL_TABLET | Freq: Two times a day (BID) | ORAL | Status: DC
  Administered 2018-10-11 – 2018-10-12 (×3): 100 mg via ORAL
  Filled 2018-10-10 (×4): qty 1

## 2018-10-10 MED ORDER — MIRTAZAPINE 15 MG PO TABS
30.0000 mg | ORAL_TABLET | Freq: Every day | ORAL | Status: DC
  Administered 2018-10-10 – 2018-10-11 (×2): 30 mg via ORAL
  Filled 2018-10-10 (×2): qty 2

## 2018-10-10 MED ORDER — SODIUM CHLORIDE 0.9 % IV SOLN
1.0000 g | INTRAVENOUS | Status: DC
  Administered 2018-10-10 – 2018-10-12 (×3): 1 g via INTRAVENOUS
  Filled 2018-10-10 (×2): qty 1
  Filled 2018-10-10: qty 10

## 2018-10-10 MED ORDER — SPIRONOLACTONE 25 MG PO TABS
50.0000 mg | ORAL_TABLET | Freq: Every day | ORAL | Status: DC
  Administered 2018-10-11 – 2018-10-12 (×2): 50 mg via ORAL
  Filled 2018-10-10 (×2): qty 2

## 2018-10-10 MED ORDER — HYDROCODONE-ACETAMINOPHEN 7.5-325 MG PO TABS
1.0000 | ORAL_TABLET | Freq: Four times a day (QID) | ORAL | Status: DC | PRN
  Administered 2018-10-10: 2 via ORAL
  Administered 2018-10-11 (×2): 1 via ORAL
  Administered 2018-10-11 – 2018-10-12 (×3): 2 via ORAL
  Filled 2018-10-10 (×2): qty 2
  Filled 2018-10-10: qty 1
  Filled 2018-10-10 (×3): qty 2

## 2018-10-10 MED ORDER — IOHEXOL 300 MG/ML  SOLN
100.0000 mL | Freq: Once | INTRAMUSCULAR | Status: AC | PRN
  Administered 2018-10-10: 100 mL via INTRAVENOUS

## 2018-10-10 MED ORDER — CLONAZEPAM 1 MG PO TABS
1.0000 mg | ORAL_TABLET | Freq: Four times a day (QID) | ORAL | Status: DC
  Administered 2018-10-11 – 2018-10-12 (×7): 1 mg via ORAL
  Filled 2018-10-10 (×8): qty 1

## 2018-10-10 MED ORDER — ROSUVASTATIN CALCIUM 5 MG PO TABS
5.0000 mg | ORAL_TABLET | Freq: Every day | ORAL | Status: DC
  Administered 2018-10-11 – 2018-10-12 (×2): 5 mg via ORAL
  Filled 2018-10-10 (×2): qty 1

## 2018-10-10 MED ORDER — AMLODIPINE BESYLATE 5 MG PO TABS
5.0000 mg | ORAL_TABLET | Freq: Every day | ORAL | Status: DC
  Administered 2018-10-11 – 2018-10-12 (×2): 5 mg via ORAL
  Filled 2018-10-10 (×2): qty 1

## 2018-10-10 MED ORDER — SODIUM CHLORIDE 0.9 % IV BOLUS
1000.0000 mL | Freq: Once | INTRAVENOUS | Status: AC
  Administered 2018-10-10: 1000 mL via INTRAVENOUS

## 2018-10-10 MED ORDER — BUSPIRONE HCL 5 MG PO TABS
10.0000 mg | ORAL_TABLET | Freq: Every day | ORAL | Status: DC
  Filled 2018-10-10: qty 2

## 2018-10-10 MED ORDER — METHOCARBAMOL 750 MG PO TABS
750.0000 mg | ORAL_TABLET | Freq: Two times a day (BID) | ORAL | Status: DC
  Administered 2018-10-10 – 2018-10-12 (×4): 750 mg via ORAL
  Filled 2018-10-10 (×4): qty 1

## 2018-10-10 MED ORDER — ACETAMINOPHEN 325 MG PO TABS: 650.0000 mg | ORAL_TABLET | Freq: Four times a day (QID) | ORAL | Status: DC | PRN

## 2018-10-10 MED ORDER — ASPIRIN EC 325 MG PO TBEC
325.0000 mg | DELAYED_RELEASE_TABLET | Freq: Every day | ORAL | Status: DC
  Administered 2018-10-11 – 2018-10-12 (×2): 325 mg via ORAL
  Filled 2018-10-10 (×2): qty 1

## 2018-10-10 MED ORDER — LEVOFLOXACIN IN D5W 750 MG/150ML IV SOLN
750.0000 mg | Freq: Once | INTRAVENOUS | Status: DC
  Filled 2018-10-10: qty 150

## 2018-10-10 MED ORDER — OXYCODONE-ACETAMINOPHEN 5-325 MG PO TABS
2.0000 | ORAL_TABLET | Freq: Once | ORAL | Status: AC
  Administered 2018-10-10: 12:00:00 2 via ORAL

## 2018-10-10 MED ORDER — INSULIN ASPART 100 UNIT/ML ~~LOC~~ SOLN
0.0000 [IU] | Freq: Three times a day (TID) | SUBCUTANEOUS | Status: DC
  Administered 2018-10-12: 3 [IU] via SUBCUTANEOUS

## 2018-10-10 MED ORDER — GABAPENTIN 300 MG PO CAPS
600.0000 mg | ORAL_CAPSULE | Freq: Three times a day (TID) | ORAL | Status: DC
  Administered 2018-10-11 – 2018-10-12 (×5): 600 mg via ORAL
  Filled 2018-10-10 (×6): qty 2

## 2018-10-10 MED ORDER — ZIPRASIDONE HCL 40 MG PO CAPS
160.0000 mg | ORAL_CAPSULE | Freq: Every evening | ORAL | Status: DC
  Administered 2018-10-10 – 2018-10-11 (×2): 160 mg via ORAL
  Filled 2018-10-10: qty 4
  Filled 2018-10-10: qty 2
  Filled 2018-10-10 (×2): qty 4

## 2018-10-10 MED ORDER — LINACLOTIDE 145 MCG PO CAPS
145.0000 ug | ORAL_CAPSULE | Freq: Every day | ORAL | Status: DC
  Administered 2018-10-11 – 2018-10-12 (×2): 145 ug via ORAL
  Filled 2018-10-10 (×2): qty 1

## 2018-10-10 MED ORDER — VANCOMYCIN HCL 10 G IV SOLR: 1250.0000 mg | INTRAVENOUS | Status: DC

## 2018-10-10 NOTE — ED Notes (Signed)
PureWick placed.

## 2018-10-10 NOTE — Progress Notes (Addendum)
Pharmacy Antibiotic Note  Jacqueline Reese is a 63 y.o. female admitted on 10/10/2018 with cellulitis.  CT LLE shows cellulitis with no abscess. Lactate elevated 2.3. Received vanc 1 gm IV in ED, slightly lower than 20 mg/kg LD of 1250 mg. Pharmacy has been consulted for vancomycin dosing and investigation of cephalosporin allergy that would necessitate levofloxacin use instead of ceftriaxone.   After speaking to pt about Keflex allergy, she explained it gave her hives, but no anaphylactic reaction, and it occurred around 5 years ago. Pt agreed to trying another cephalosporin during this admission. Levofloxacin has been discontinued and ceftriaxone started per consult note.  WBC elevated at 22 with no recent steroid taken. Afebrile, Tmax 98.4. Renal fxn stable, Scr 0.78.   Plan: Vancomycin 1250 mg IV Q 24 hrs. Goal AUC 400-550. Expected AUC: 511.8 SCr used: 0.80 Ceftriaxone 1 gm IV q24h, monitor for allergic reactions F/u cxs, clinical improvement, renal fxn, and abx de-escalation as appropriate  Weight: 143 lb (64.9 kg)  Temp (24hrs), Avg:98.4 F (36.9 C), Min:98.4 F (36.9 C), Max:98.4 F (36.9 C)  Recent Labs  Lab 10/10/18 0844 10/10/18 1615  WBC 22.0*  --   CREATININE 0.78  --   LATICACIDVEN  --  2.3*    Estimated Creatinine Clearance: 64.5 mL/min (by C-G formula based on SCr of 0.78 mg/dL).    Allergies  Allergen Reactions  . Lisinopril Swelling    Angioedema   . Chantix [Varenicline]   . Doxycycline Other (See Comments)    Interactions with other medications  . Ibuprofen Nausea And Vomiting  . Lamisil [Terbinafine] Hives  . Cephalexin Rash  . Sulfa Antibiotics Anxiety    Antimicrobials this admission: 8/7 vanc >> 8/7 ceftriaxone >>  Microbiology results: 8/7 BCx: sent  Thank you for allowing pharmacy to be a part of this patient's care.  Mirian Capuchin, 10/10/2018 5:31 PM

## 2018-10-10 NOTE — ED Provider Notes (Signed)
63yo female with chronic hip/LE pain. Seen in the ER 09/29/2018- no assymetric edema. Today with diffuse edema to left leg, painful. DVT study negative. WBC 22k, no obvious cellulitis. Plan is to CT LLE to evaluate for infection.  Physical Exam  BP 122/74   Pulse 91   Temp 98.4 F (36.9 C) (Oral)   Resp (!) 30   Wt 64.9 kg   LMP 05/09/2010   SpO2 100%   BMI 26.16 kg/m   Physical Exam Erythema with induration and tenderness to the medial left lower thigh extending into the medial calf.  Noncircumferential, no streaking. NVI, DP pulses present. ED Course/Procedures     Procedures  MDM  CT left lower extremity shows cellulitis with developing phlegmon, no abscess.  CT findings consistent with exam.  Case discussed with Dr.-year-old, patient with antibiotics per sepsis protocol, plan is to consult hospitalist for admission.  Patient's white count is 22,000, and discussed with patient, she has not had an injection of steroids recently, states that she did take leftover prednisone last week. 1712hrs case discussed with [facility] who will consult for admission.       Tacy Learn, PA-C 10/10/18 1713    Drenda Freeze, MD 10/10/18 [phone]

## 2018-10-10 NOTE — ED Notes (Signed)
Vascular bedside

## 2018-10-10 NOTE — H&P (Addendum)
History and Physical    Jacqueline Reese HMC:[id number] DOB: [birth year] DOA: 10/10/2018  PCP: Nolene Ebbs, MD Patient coming from: Home  Chief Complaint: Tired of leg hurting  HPI: Jacqueline Reese is a 63 y.o. female with medical history significant of schizophrenia, diabetes mellitus, anxiety, depression, arthritis, hypertension, gout.  Patient presented secondary to 3 weeks of worsening leg pain, left greater than right.  She reports not being able to walk on her left leg more recently.  She has been using her home hydrocodone without much benefit.  ED Course: Vitals: Afebrile, mild tachycardia to 90s-100s, RR between 16-20, normotensive, on room air Labs: Albumin of 3, AST/ALT of 45/46, CK of 889, lactic acid os 2.3, WBC of 22 Imaging: CT leg significant for cellulitis with evidence of phlegmon in tib/fib area, no DVT on venous duplex Medications/Course: Vancomycin, ceftriaxone  Review of Systems: Review of Systems  Constitutional: Negative for chills and fever.  Cardiovascular: Negative for chest pain.  Gastrointestinal: Negative for nausea and vomiting.  All other systems reviewed and are negative.   Past Medical History:  Diagnosis Date   Arthritis    Chronic back pain    Depression    Diabetes mellitus without complication ([place])    Gout    Hypertension    Schizophrenic disorder ([place])     Past Surgical History:  Procedure Laterality Date   BREAST BIOPSY Left    core biopsy   CESAREAN SECTION     x2   NASAL ENDOSCOPY Left 09/13/2015   Procedure: NASAL ENDOSCOPY WITH NASAL INTABATION;  Surgeon: Jerrell Belfast, MD;  Location: [facility];  Service: ENT;  Laterality: Left;     reports that she has been smoking cigarettes. She has been smoking about 0.33 packs per day. She has never used smokeless tobacco. She reports that she does not drink alcohol or use drugs.  Allergies  Allergen Reactions   Lisinopril Swelling    Angioedema    Chantix  [Varenicline]    Doxycycline Other (See Comments)    Interactions with other medications   Ibuprofen Nausea And Vomiting   Lamisil [Terbinafine] Hives   Cephalexin Rash   Sulfa Antibiotics Anxiety    Family History  Problem Relation Age of Onset   Diabetes Mother    Prior to Admission medications   Medication Sig Start Date End Date Taking? Authorizing Provider  acetaminophen (TYLENOL) 500 MG tablet Take 1,500 mg by mouth every 6 (six) hours as needed for mild pain.   Yes [provider]  allopurinol (ZYLOPRIM) 100 MG tablet TAKE 1 TABLET(100 MG) BY MOUTH TWICE DAILY Patient taking differently: Take 100 mg by mouth 2 (two) times daily.  09/29/18  Yes Newt Minion, MD  amLODipine (NORVASC) 5 MG tablet Take 5 mg by mouth daily. 10/10/18  Yes [provider]  aspirin EC 325 MG tablet Take 325 mg by mouth daily.   Yes [provider]  busPIRone (BUSPAR) 10 MG tablet Take 10 mg by mouth daily. 09/11/18  Yes [provider]  clonazePAM (KLONOPIN) 1 MG tablet Take 1 mg by mouth 4 (four) times daily.    Yes [provider]  diclofenac sodium (VOLTAREN) 1 % GEL Apply 2 g topically 4 (four) times daily. 07/27/18  Yes Lamptey, Myrene Galas, MD  furosemide (LASIX) 40 MG tablet Take 40 mg by mouth daily. 08/19/18  Yes [provider]  gabapentin (NEURONTIN) 300 MG capsule Take 2 capsules (600 mg total) by mouth 3 (three) times  daily. 12/27/17  Yes Newt Minion, MD  HYDROcodone-acetaminophen (NORCO) 7.5-325 MG tablet Take 1 tablet by mouth every 6 (six) hours as needed. 09/19/18  Yes [provider]  LINZESS 145 MCG CAPS capsule Take 145 mcg by mouth daily.  09/14/17  Yes [provider]  meloxicam (MOBIC) 15 MG tablet Take 1 tablet (15 mg total) by mouth daily. Patient taking differently: Take 15 mg by mouth every 6 (six) hours as needed for pain.  07/13/18  Yes Robyn Haber, MD  metFORMIN (GLUCOPHAGE) 500 MG tablet Take 500 mg  by mouth 2 (two) times daily with a meal.   Yes [provider]  methocarbamol (ROBAXIN) 750 MG tablet Take 750 mg by mouth 2 (two) times daily. 08/06/18  Yes [provider]  mirtazapine (REMERON) 30 MG tablet Take 30 mg by mouth at bedtime.  08/06/18  Yes [provider]  naproxen (NAPROSYN) 500 MG tablet Take 500 mg by mouth daily. 06/20/18  Yes [provider]  potassium chloride (K-DUR) 10 MEQ tablet Take 10 mEq by mouth daily. 09/24/18  Yes [provider]  predniSONE (DELTASONE) 10 MG tablet Take 1 tablet (10 mg total) by mouth daily with breakfast. 09/04/18  Yes Rayburn, Neta Mends, PA-C  rosuvastatin (CRESTOR) 5 MG tablet Take 5 mg by mouth daily. 10/10/18  Yes [provider]  spironolactone (ALDACTONE) 50 MG tablet Take 50 mg by mouth daily.   Yes [provider]  ziprasidone (GEODON) 80 MG capsule Take 160 mg by mouth every evening.  07/13/15  Yes [provider]  diclofenac (VOLTAREN) 75 MG EC tablet TAKE 1 TABLET BY MOUTH TWICE DAILY WITH FOOD AS NEEDED Patient not taking: No sig reported 09/17/18   Newt Minion, MD  potassium chloride SA (K-DUR,KLOR-CON) 20 MEQ tablet Take 1 tablet (20 mEq total) by mouth 2 (two) times daily for 3 days. Patient not taking: Reported on 10/10/2018 10/24/17 10/27/17  Langston Masker B, PA-C  tiZANidine (ZANAFLEX) 4 MG tablet Take 1 tablet (4 mg total) by mouth 3 (three) times daily. Patient not taking: Reported on 10/10/2018 09/19/18 09/19/19  Rayburn, Neta Mends, PA-C    Physical Exam:  Physical Exam Constitutional:      General: She is not in acute distress.    Appearance: She is well-developed. She is not diaphoretic.  Eyes:     Conjunctiva/sclera: Conjunctivae normal.     Pupils: Pupils are equal, round, and reactive to light.  Neck:     Musculoskeletal: Normal range of motion.  Cardiovascular:     Rate and Rhythm: Normal rate and regular rhythm.     Pulses:           Dorsalis pedis pulses are 2+ on the right side and 2+ on the left side.     Heart sounds: Normal heart sounds. No murmur.  Pulmonary:     Effort: Pulmonary effort is normal. No respiratory distress.     Breath sounds: Normal breath sounds. No wheezing or rales.  Abdominal:     General: Bowel sounds are normal. There is no distension.     Palpations: Abdomen is soft.     Tenderness: There is no abdominal tenderness. There is no guarding or rebound.  Musculoskeletal: Normal range of motion.        General: No tenderness.     Right lower leg: Edema present.     Left lower leg: Edema present.  Lymphadenopathy:     Cervical: No cervical adenopathy.  Skin:    General: Skin is warm and dry.     Comments: Erythema without tenderness of left medial thigh. Area of ecchymosis of left lower leg. No fluctuance noted.  Neurological:     Mental Status: She is alert and oriented to person, place, and time.      Labs on Admission: I have personally reviewed following labs and imaging studies  CBC: Recent Labs  Lab 10/10/18 0844  WBC 22.0*  NEUTROABS 19.5*  HGB 9.8*  HCT 32.7*  MCV 94.8  PLT 502*    Basic Metabolic Panel: Recent Labs  Lab 10/10/18 0844  NA 138  K 3.9  CL 104  CO2 19*  GLUCOSE 161*  BUN 17  CREATININE 0.78  CALCIUM 9.2    GFR: CrCl cannot be calculated (Unknown ideal weight.).  Liver Function Tests: No results for input(s): AST, ALT, ALKPHOS, BILITOT, PROT, ALBUMIN in the last 168 hours. No results for input(s): LIPASE, AMYLASE in the last 168 hours. No results for input(s): AMMONIA in the last 168 hours.  Coagulation Profile: No results for input(s): INR, PROTIME in the last 168 hours.  Cardiac Enzymes: No results for input(s): CKTOTAL, CKMB, CKMBINDEX, TROPONINI in the last 168 hours.  BNP (last 3 results) No results for input(s): PROBNP in the last 8760 hours.  HbA1C: No results for input(s): HGBA1C in the last 72 hours.  CBG: No results for  input(s): GLUCAP in the last 168 hours.  Lipid Profile: No results for input(s): CHOL, HDL, LDLCALC, TRIG, CHOLHDL, LDLDIRECT in the last 72 hours.  Thyroid Function Tests: No results for input(s): TSH, T4TOTAL, FREET4, T3FREE, THYROIDAB in the last 72 hours.  Anemia Panel: No results for input(s): VITAMINB12, FOLATE, FERRITIN, TIBC, IRON, RETICCTPCT in the last 72 hours.  Urine analysis:    Component Value Date/Time   COLORURINE AMBER (A) 10/24/2017 1902   APPEARANCEUR HAZY (A) 10/24/2017 1902   LABSPEC 1.028 10/24/2017 1902   PHURINE 5.0 10/24/2017 1902   GLUCOSEU NEGATIVE 10/24/2017 1902   HGBUR NEGATIVE 10/24/2017 1902   HGBUR negative 04/27/2008 0825   BILIRUBINUR NEGATIVE 10/24/2017 1902   KETONESUR 5 (A) 10/24/2017 1902   PROTEINUR NEGATIVE 10/24/2017 1902   UROBILINOGEN 0.2 09/13/2013 0926   NITRITE NEGATIVE 10/24/2017 1902   LEUKOCYTESUR NEGATIVE 10/24/2017 1902     Radiological Exams on Admission: Ct Extremity Lower Left W Contrast  Result Date: 10/10/2018 CLINICAL DATA:  Leg swelling and leukocytosis EXAM: CT OF THE LOWER LEFT EXTREMITY WITH CONTRAST TECHNIQUE: Multidetector CT imaging of the lower left extremity was performed according to the standard protocol following intravenous contrast administration. COMPARISON:  None. CONTRAST:  17mL OMNIPAQUE IOHEXOL 300 MG/ML  SOLN FINDINGS: Bones/Joint/Cartilage No fracture or dislocation. There is mild medial and patellofemoral compartment osteoarthritis with joint space loss and marginal osteophyte formation. Enthesophytes seen at the superior patellar pole. There is also calcaneal enthesophytes seen. Normal bone mineralization seen throughout. No areas of cortical destruction or erosion are seen. Ligaments Suboptimally assessed by CT. Muscles and Tendons There is mild fatty atrophy noted within the musculature surrounding the lower extremity. However the muscles appear to be intact. The tendons are intact. Soft tissues There  is diffuse subcutaneous edema seen surrounding the lower extremity with areas of skin thickening. There is mild loculated fluid seen surrounding the mid tibia/fibula within the subcutaneous tissues. No loculated fluid collection or extension into the deep fascial layers is seen. IMPRESSION: Findings of cellulitis with phlegmon in the mid tibia/fibula subcutaneous tissues. No evidence  of abscess or osteomyelitis. Electronically Signed   By: Prudencio Pair M.D.   On: 10/10/2018 16:22   Vas [place] Lower Extremity Venous (dvt) (only [facility])  Result Date: 10/10/2018  Lower Venous Study Indications: Pain, and Edema.  Limitations: Body habitus and Edema. Comparison Study: Previous study 07/27/2017 negative bilaterally Performing Technologist: Toma Copier RVS  Examination Guidelines: A complete evaluation includes B-mode imaging, spectral Doppler, color Doppler, and power Doppler as needed of all accessible portions of each vessel. Bilateral testing is considered an integral part of a complete examination. Limited examinations for reoccurring indications may be performed as noted.  +-----+---------------+---------+-----------+----------+-------+  RIGHT Compressibility Phasicity Spontaneity Properties Summary  +-----+---------------+---------+-----------+----------+-------+  CFV   Full            Yes       Yes                             +-----+---------------+---------+-----------+----------+-------+  SFJ   Full                                                      +-----+---------------+---------+-----------+----------+-------+   +---------+---------------+---------+-----------+----------+-------------------+  LEFT      Compressibility Phasicity Spontaneity Properties Summary              +---------+---------------+---------+-----------+----------+-------------------+  CFV       Full                                                                   +---------+---------------+---------+-----------+----------+-------------------+  SFJ       Full                                                                  +---------+---------------+---------+-----------+----------+-------------------+  FV Prox   Full                                                                  +---------+---------------+---------+-----------+----------+-------------------+  FV Mid    Full                                                                  +---------+---------------+---------+-----------+----------+-------------------+  FV Distal Full  Techncially                                                                      difficult to image   +---------+---------------+---------+-----------+----------+-------------------+  PFV       Full                                                                  +---------+---------------+---------+-----------+----------+-------------------+  POP       Full                                                                  +---------+---------------+---------+-----------+----------+-------------------+  PTV       Full                                                                  +---------+---------------+---------+-----------+----------+-------------------+  PERO                                                       Techncially                                                                      difficult to image                                                               fully                +---------+---------------+---------+-----------+----------+-------------------+   Left Technical Findings: Not visualized segments include Peroneal and distal femoral. Techncially difficult to image the distal femoral and peroneal veins due to body habitus and edema   Summary: Right: There is no evidence of a common femoral vein obstruction. Left: There is no evidence of deep vein thrombosis in the lower  extremity. However, portions of this examination were limited- see technologist comments above. No cystic structure found in the popliteal fossa.  *See table(s) above for measurements and observations.    Preliminary  EKG: Independently reviewed. Sinus tachycardia  Assessment/Plan Active Problems:   Cellulitis   Cellulitis Unknown etiology. No evidence of abscess. Patient with significant pain and unable to ambulate well. No prior treatment as an outpatient -Continue ceftriaxone, discontinue vancomycin -Blood cultures pending -Repeat CBC in AM  Sepsis Present on admission. Secondary to leukocytosis and tachycardia. Likely secondary to above. Blood culture pending. Afebrile. -CBC in AM  Elevated CK Secondary to cellulitis. Mild elevation -Recheck CK in AM  Elevated AST/ALT In setting of elevated CK. Mild elevation.  Diabetes mellitus, type 2 Patient is on metformin. -SSI qAC -Hemoglobin A1C  Gout No flare -Continue allopurinol  Essential hypertension -Continue amlodipine, spironolactone  Mood disorder Schizophrenia -Continue Klonopin, Remeron, Buspar -Continue Geodon   DVT prophylaxis: Lovenox Code Status: Full code Family Communication: None Disposition Plan: Discharge pending improvement of cellulitis Consults called: None Admission status: Observation   Cordelia Poche, MD [facility] 10/10/2018, 5:12 PM

## 2018-10-10 NOTE — Progress Notes (Signed)
Left lower extremity venous duplex completed. Preliminary results in Chart review CV Proc. [place] Sueo Cullen,RVS 10/10/2018, 10:45 AM

## 2018-10-10 NOTE — ED Notes (Signed)
Pt is asking for pain meds.

## 2018-10-10 NOTE — ED Notes (Signed)
ED TO INPATIENT HANDOFF REPORT  ED Nurse Name and Phone #: [name]/Age/Gender Jacqueline Reese 63 y.o. female Room/Bed: 027C/027C  Code Status  Full code Home/SNF/Other Dc home AO x 4   Triage Complete: Triage complete  Chief Complaint LEG PAIN AND EDEMA  Triage Note Presents with 1 week hx of edema to LLE with inability to WB.  Pain noted an 8.  3+ eedma noted to LLE , 1+ to RLE.   Denies any injury, SOB or chest pain.     Allergies Allergies  Allergen Reactions  . Lisinopril Swelling    Angioedema   . Chantix [Varenicline]   . Doxycycline Other (See Comments)    Interactions with other medications  . Ibuprofen Nausea And Vomiting  . Lamisil [Terbinafine] Hives  . Cephalexin Rash  . Sulfa Antibiotics Anxiety    Level of Care/Admitting Diagnosis ED Disposition    ED Disposition Condition [facility] Area: [place] [100100]  Level of Care: Med-Surg [16]  I expect the patient will be discharged within 24 hours: Yes  LOW acuity---Tx typically complete <24 hrs---ACUTE conditions typically can be evaluated <24 hours---LABS likely to return to acceptable levels <24 hours---IS near functional baseline---EXPECTED to return to current living arrangement---NOT newly hypoxic: Meets criteria for 5C-Observation unit  Covid Evaluation: Asymptomatic Screening Protocol (No Symptoms)  Diagnosis: Cellulitis [295621]  Admitting Physician: [name] [phone]  Attending Physician: Cordelia Poche A [phone]  PT Class (Do Not Modify): Observation [104]  PT Acc Code (Do Not Modify): Observation [10022]       B Medical/Surgery History Past Medical History:  Diagnosis Date  . Arthritis   . Chronic back pain   . Depression   . Diabetes mellitus without complication ([place])   . Gout   . Hypertension   . Schizophrenic disorder [facility])    Past Surgical History:  Procedure Laterality Date  . BREAST BIOPSY Left    core biopsy  .  CESAREAN SECTION     x2  . NASAL ENDOSCOPY Left 09/13/2015   Procedure: NASAL ENDOSCOPY WITH NASAL INTABATION;  Surgeon: Jerrell Belfast, MD;  Location: [facility];  Service: ENT;  Laterality: Left;     A IV Location/Drains/Wounds Patient Lines/Drains/Airways Status   Active Line/Drains/Airways    Name:   Placement date:   Placement time:   Site:   Days:   Peripheral IV 10/10/18 Left Antecubital   10/10/18    0830    Antecubital   less than 1          Intake/Output Last 24 hours  Intake/Output Summary (Last 24 hours) at 10/10/2018 1936 Last data filed at 10/10/2018 1830 Gross per 24 hour  Intake 200 ml  Output -  Net 200 ml    Labs/Imaging Results for orders placed or performed during the hospital encounter of 10/10/18 (from the past 48 hour(s))  CBC with Differential     Status: Abnormal   Collection Time: 10/10/18  8:44 AM  Result Value Ref Range   WBC 22.0 (H) 4.0 - 10.5 K/uL   RBC 3.45 (L) 3.87 - 5.11 MIL/uL   Hemoglobin 9.8 (L) 12.0 - 15.0 g/dL   HCT 32.7 (L) 36.0 - 46.0 %   MCV 94.8 80.0 - 100.0 fL   MCH 28.4 26.0 - 34.0 pg   MCHC 30.0 30.0 - 36.0 g/dL   RDW 21.6 (H) 11.5 - 15.5 %   Platelets 502 (H) 150 - 400 K/uL  nRBC 1.7 (H) 0.0 - 0.2 %   Neutrophils Relative % 87 %   Neutro Abs 19.5 (H) 1.7 - 7.7 K/uL   Lymphocytes Relative 6 %   Lymphs Abs 1.2 0.7 - 4.0 K/uL   Monocytes Relative 3 %   Monocytes Absolute 0.6 0.1 - 1.0 K/uL   Eosinophils Relative 0 %   Eosinophils Absolute 0.0 0.0 - 0.5 K/uL   Basophils Relative 0 %   Basophils Absolute 0.0 0.0 - 0.1 K/uL   RBC Morphology MORPHOLOGY UNREMARKABLE    Immature Granulocytes 4 %   Abs Immature Granulocytes 0.78 (H) 0.00 - 0.07 K/uL    Comment: Performed at [facility] [address], [place], [place] [address]  Basic metabolic panel     Status: Abnormal   Collection Time: 10/10/18  8:44 AM  Result Value Ref Range   Sodium 138 135 - 145 mmol/L   Potassium 3.9 3.5 - 5.1 mmol/L   Chloride 104 98 - 111  mmol/L   CO2 19 (L) 22 - 32 mmol/L   Glucose, Bld 161 (H) 70 - 99 mg/dL   BUN 17 8 - 23 mg/dL   Creatinine, Ser 0.78 0.44 - 1.00 mg/dL   Calcium 9.2 8.9 - 10.3 mg/dL   GFR calc non Af Amer >60 >60 mL/min   GFR calc Af Amer >60 >60 mL/min   Anion gap 15 5 - 15    Comment: Performed at [facility], [place] [address], [place], [place] [address]  Lactic acid, plasma     Status: Abnormal   Collection Time: 10/10/18  4:15 PM  Result Value Ref Range   Lactic Acid, Venous 2.3 (HH) 0.5 - 1.9 mmol/L    Comment: CRITICAL RESULT CALLED TO, READ BACK BY AND VERIFIED WITH: M CEFFEY RN AT [phone] ON [id number] BY Marcos Eke Performed at [facility], [place] [address], [place], [place] [address]   CK     Status: Abnormal   Collection Time: 10/10/18  4:38 PM  Result Value Ref Range   Total CK 889 (H) 38 - 234 U/L    Comment: Performed at [facility], [place] [address] [place], [place] [address]  Hepatic function panel     Status: Abnormal   Collection Time: 10/10/18  4:38 PM  Result Value Ref Range   Total Protein 5.9 (L) 6.5 - 8.1 g/dL   Albumin 3.0 (L) 3.5 - 5.0 g/dL   AST 45 (H) 15 - 41 U/L   ALT 46 (H) 0 - 44 U/L   Alkaline Phosphatase 75 38 - 126 U/L   Total Bilirubin 0.7 0.3 - 1.2 mg/dL   Bilirubin, Direct 0.2 0.0 - 0.2 mg/dL   Indirect Bilirubin 0.5 0.3 - 0.9 mg/dL    Comment: Performed at [facility] [address], [place], [place] [address]   Ct Extremity Lower Left W Contrast  Result Date: 10/10/2018 CLINICAL DATA:  Leg swelling and leukocytosis EXAM: CT OF THE LOWER LEFT EXTREMITY WITH CONTRAST TECHNIQUE: Multidetector CT imaging of the lower left extremity was performed according to the standard protocol following intravenous contrast administration. COMPARISON:  None. CONTRAST:  175mL OMNIPAQUE IOHEXOL 300 MG/ML  SOLN FINDINGS: Bones/Joint/Cartilage No fracture or dislocation. There is mild medial and patellofemoral compartment osteoarthritis with joint space loss and  marginal osteophyte formation. Enthesophytes seen at the superior patellar pole. There is also calcaneal enthesophytes seen. Normal bone mineralization seen throughout. No areas of cortical destruction  or erosion are seen. Ligaments Suboptimally assessed by CT. Muscles and Tendons There is mild fatty atrophy noted within the musculature surrounding the lower extremity. However the muscles appear to be intact. The tendons are intact. Soft tissues There is diffuse subcutaneous edema seen surrounding the lower extremity with areas of skin thickening. There is mild loculated fluid seen surrounding the mid tibia/fibula within the subcutaneous tissues. No loculated fluid collection or extension into the deep fascial layers is seen. IMPRESSION: Findings of cellulitis with phlegmon in the mid tibia/fibula subcutaneous tissues. No evidence of abscess or osteomyelitis. Electronically Signed   By: Prudencio Pair M.D.   On: 10/10/2018 16:22   Vas [place] Lower Extremity Venous (dvt) (only [facility])  Result Date: 10/10/2018  Lower Venous Study Indications: Pain, and Edema.  Limitations: Body habitus and Edema. Comparison Study: Previous study 07/27/2017 negative bilaterally Performing Technologist: Toma Copier RVS  Examination Guidelines: A complete evaluation includes B-mode imaging, spectral Doppler, color Doppler, and power Doppler as needed of all accessible portions of each vessel. Bilateral testing is considered an integral part of a complete examination. Limited examinations for reoccurring indications may be performed as noted.  +-----+---------------+---------+-----------+----------+-------+ RIGHTCompressibilityPhasicitySpontaneityPropertiesSummary +-----+---------------+---------+-----------+----------+-------+ CFV  Full           Yes      Yes                          +-----+---------------+---------+-----------+----------+-------+ SFJ  Full                                                  +-----+---------------+---------+-----------+----------+-------+   +---------+---------------+---------+-----------+----------+-------------------+ LEFT     CompressibilityPhasicitySpontaneityPropertiesSummary             +---------+---------------+---------+-----------+----------+-------------------+ CFV      Full                                                             +---------+---------------+---------+-----------+----------+-------------------+ SFJ      Full                                                             +---------+---------------+---------+-----------+----------+-------------------+ FV Prox  Full                                                             +---------+---------------+---------+-----------+----------+-------------------+ FV Mid   Full                                                             +---------+---------------+---------+-----------+----------+-------------------+ FV DistalFull  Techncially                                                               difficult to image  +---------+---------------+---------+-----------+----------+-------------------+ PFV      Full                                                             +---------+---------------+---------+-----------+----------+-------------------+ POP      Full                                                             +---------+---------------+---------+-----------+----------+-------------------+ PTV      Full                                                             +---------+---------------+---------+-----------+----------+-------------------+ PERO                                                  Techncially                                                               difficult to image                                                        fully                +---------+---------------+---------+-----------+----------+-------------------+   Left Technical Findings: Not visualized segments include Peroneal and distal femoral. Techncially difficult to image the distal femoral and peroneal veins due to body habitus and edema   Summary: Right: There is no evidence of a common femoral vein obstruction. Left: There is no evidence of deep vein thrombosis in the lower extremity. However, portions of this examination were limited- see technologist comments above. No cystic structure found in the popliteal fossa.  *See table(s) above for measurements and observations.    Preliminary     Pending Labs Unresulted Labs (From admission, onward)    Start     Ordered   10/10/18 1657  SARS CORONAVIRUS 2 Nasal Swab Aptima Multi Swab  (Asymptomatic/Tier 2 Patients Labs)  Once,   STAT    Question Answer Comment  Is this test  for diagnosis or screening Screening   Symptomatic for COVID-19 as defined by CDC No   Hospitalized for COVID-19 No   Admitted to ICU for COVID-19 No   Previously tested for COVID-19 No   Resident in a congregate (group) care setting No   Employed in healthcare setting No   Pregnant No      10/10/18 1657   10/10/18 1615  Lactic acid, plasma  Now then every 2 hours,   STAT     10/10/18 1614   10/10/18 1615  Blood culture (routine x 2)  BLOOD CULTURE X 2,   STAT     10/10/18 1614   Signed and Held  HIV antibody (Routine Testing)  Once,   R     Signed and Held   Signed and Held  Creatinine, serum  (enoxaparin (LOVENOX)    CrCl >/= 30 ml/min)  Weekly,   R    Comments: while on enoxaparin therapy    Signed and Held   Signed and Held  Comprehensive metabolic panel  Tomorrow morning,   R     Signed and Held   Signed and Held  CBC  Tomorrow morning,   R     Signed and Held          Vitals/Pain Today's Vitals   10/10/18 1430 10/10/18 1645 10/10/18 1700 10/10/18 1915  BP: 122/74 124/79 (!) 141/77 107/70  Pulse:   82   Resp: (!) 30 20 (!)  30 (!) 22  Temp:      TempSrc:      SpO2:   97%   Weight:   64.9 kg   PainSc:        Isolation Precautions No active isolations  Medications Medications  cefTRIAXone (ROCEPHIN) 1 g in sodium chloride 0.9 % 100 mL IVPB (has no administration in time range)  vancomycin (VANCOCIN) 1,250 mg in sodium chloride 0.9 % 250 mL IVPB (has no administration in time range)  oxyCODONE-acetaminophen (PERCOCET/ROXICET) 5-325 MG per tablet 2 tablet (2 tablets Oral Given 10/10/18 1137)  iohexol (OMNIPAQUE) 300 MG/ML solution 100 mL (100 mLs Intravenous Contrast Given 10/10/18 1546)  vancomycin (VANCOCIN) IVPB 1000 mg/200 mL premix (0 mg Intravenous Stopped 10/10/18 1830)  sodium chloride 0.9 % bolus 1,000 mL (1,000 mLs Intravenous New Bag/Given 10/10/18 1704)    Mobility Complete care High fall risk   Focused Assessments AO x 4, swollen left leg painful to touch. Skin intact   R Recommendations: See Admitting Provider Note  Report given to:   Additional Notes:

## 2018-10-10 NOTE — ED Notes (Signed)
Pt given meal & juice per MD.

## 2018-10-10 NOTE — ED Notes (Signed)
Labs drawn without difficulty.  Pt remains pleasant and jovial without acute pain responses.  Denies need for any additional pain meds.  Requesting dinner at this time.

## 2018-10-10 NOTE — ED Provider Notes (Signed)
[facility] EMERGENCY DEPARTMENT Provider Note   CSN: [id number] Arrival date & time: 10/10/18  0825    History   Chief Complaint No chief complaint on file.   HPI Jacqueline Reese is a 63 y.o. female.     The history is provided by the patient and medical records. No language interpreter was used.   Jacqueline Reese is a 63 y.o. female  with a PMH as listed below including HTN, DM, chronic pain who presents to the Emergency Department complaining of left leg swelling x 1 week.  She reports history of chronic pain, but left leg is much more painful than normal.  She is not getting much improvement with her home hydrocodone.  She denies any known injury.  No chest pain or shortness of breath.  She does report due to her chronic bilateral hip pain, she is not very ambulatory.  She is not on any blood thinner medications.  No history of DVT or PE in the past.  While she has struggled with pain to her legs before, she denies history of swelling.  Denies any overlying skin changes.  No fevers.   Past Medical History:  Diagnosis Date  . Arthritis   . Chronic back pain   . Depression   . Diabetes mellitus without complication ([place])   . Gout   . Hypertension   . Schizophrenic disorder [facility])     Patient Active Problem List   Diagnosis Date Noted  . Primary osteoarthritis of left hip 10/08/2018  . Primary osteoarthritis of right hip 10/08/2018  . Angioedema 09/13/2015  . Anaphylaxis   . Hypertension, essential   . Acute hypoxemic respiratory failure ([place])   . Type 2 diabetes mellitus without complication, without long-term current use of insulin ([place])   . DIAB W/O MENTION COMP TYPE II/UNS TYPE UNCNTRL 01/19/2010  . FOOT ULCER, RIGHT 01/19/2010  . MENOPAUSE-RELATED VASOMOTOR SYMPTOMS, HOT FLASHES 10/04/2009  . GOUT, UNSPECIFIED 06/07/2009  . LICHEN SIMPLEX CHRONICUS 11/30/2008  . OTITIS EXTERNA, CHRONIC 04/16/2008  . OBESITY, NOS 05/02/2006  .  SCHIZOPHRENIA 05/02/2006  . HYPERTENSION, BENIGN SYSTEMIC 05/02/2006  . GASTROESOPHAGEAL REFLUX, NO ESOPHAGITIS 05/02/2006    Past Surgical History:  Procedure Laterality Date  . BREAST BIOPSY Left    core biopsy  . CESAREAN SECTION     x2  . NASAL ENDOSCOPY Left 09/13/2015   Procedure: NASAL ENDOSCOPY WITH NASAL INTABATION;  Surgeon: Jerrell Belfast, MD;  Location: [facility];  Service: ENT;  Laterality: Left;     OB History   No obstetric history on file.      Home Medications    Prior to Admission medications   Medication Sig Start Date End Date Taking? Authorizing Provider  acetaminophen (TYLENOL) 500 MG tablet Take 1,500 mg by mouth every 6 (six) hours as needed for mild pain.   Yes [provider]  allopurinol (ZYLOPRIM) 100 MG tablet TAKE 1 TABLET(100 MG) BY MOUTH TWICE DAILY Patient taking differently: Take 100 mg by mouth 2 (two) times daily.  09/29/18  Yes Newt Minion, MD  amLODipine (NORVASC) 5 MG tablet Take 5 mg by mouth daily. 10/10/18  Yes [provider]  aspirin EC 325 MG tablet Take 325 mg by mouth daily.   Yes [provider]  busPIRone (BUSPAR) 10 MG tablet Take 10 mg by mouth daily. 09/11/18  Yes [provider]  clonazePAM (KLONOPIN) 1 MG tablet Take 1 mg by mouth 4 (four) times daily.  Yes [provider]  diclofenac sodium (VOLTAREN) 1 % GEL Apply 2 g topically 4 (four) times daily. 07/27/18  Yes Lamptey, Myrene Galas, MD  furosemide (LASIX) 40 MG tablet Take 40 mg by mouth daily. 08/19/18  Yes [provider]  gabapentin (NEURONTIN) 300 MG capsule Take 2 capsules (600 mg total) by mouth 3 (three) times daily. 12/27/17  Yes Newt Minion, MD  HYDROcodone-acetaminophen (NORCO) 7.5-325 MG tablet Take 1 tablet by mouth every 6 (six) hours as needed. 09/19/18  Yes [provider]  LINZESS 145 MCG CAPS capsule Take 145 mcg by mouth daily.  09/14/17  Yes [provider]  meloxicam (MOBIC) 15 MG tablet  Take 1 tablet (15 mg total) by mouth daily. Patient taking differently: Take 15 mg by mouth every 6 (six) hours as needed for pain.  07/13/18  Yes Robyn Haber, MD  metFORMIN (GLUCOPHAGE) 500 MG tablet Take 500 mg by mouth 2 (two) times daily with a meal.   Yes [provider]  methocarbamol (ROBAXIN) 750 MG tablet Take 750 mg by mouth 2 (two) times daily. 08/06/18  Yes [provider]  mirtazapine (REMERON) 30 MG tablet Take 30 mg by mouth at bedtime.  08/06/18  Yes [provider]  naproxen (NAPROSYN) 500 MG tablet Take 500 mg by mouth daily. 06/20/18  Yes [provider]  potassium chloride (K-DUR) 10 MEQ tablet Take 10 mEq by mouth daily. 09/24/18  Yes [provider]  predniSONE (DELTASONE) 10 MG tablet Take 1 tablet (10 mg total) by mouth daily with breakfast. 09/04/18  Yes Rayburn, Neta Mends, PA-C  rosuvastatin (CRESTOR) 5 MG tablet Take 5 mg by mouth daily. 10/10/18  Yes [provider]  spironolactone (ALDACTONE) 50 MG tablet Take 50 mg by mouth daily.   Yes [provider]  ziprasidone (GEODON) 80 MG capsule Take 160 mg by mouth every evening.  07/13/15  Yes [provider]  diclofenac (VOLTAREN) 75 MG EC tablet TAKE 1 TABLET BY MOUTH TWICE DAILY WITH FOOD AS NEEDED Patient not taking: No sig reported 09/17/18   Newt Minion, MD  potassium chloride SA (K-DUR,KLOR-CON) 20 MEQ tablet Take 1 tablet (20 mEq total) by mouth 2 (two) times daily for 3 days. Patient not taking: Reported on 10/10/2018 10/24/17 10/27/17  Langston Masker B, PA-C  tiZANidine (ZANAFLEX) 4 MG tablet Take 1 tablet (4 mg total) by mouth 3 (three) times daily. Patient not taking: Reported on 10/10/2018 09/19/18 09/19/19  Rayburn, Neta Mends, PA-C    Family History Family History  Problem Relation Age of Onset  . Diabetes Mother     Social History Social History   Tobacco Use  . Smoking status: Current Every Day Smoker    Packs/day: 0.33     Types: Cigarettes  . Smokeless tobacco: Never Used  Substance Use Topics  . Alcohol use: No  . Drug use: No     Allergies   Lisinopril, Chantix [varenicline], Doxycycline, Ibuprofen, Lamisil [terbinafine], Cephalexin, and Sulfa antibiotics   Review of Systems Review of Systems  Respiratory: Negative for shortness of breath.   Cardiovascular: Positive for leg swelling. Negative for chest pain and palpitations.  Musculoskeletal: Positive for arthralgias and myalgias.  Skin: Negative for color change.  All other systems reviewed and are negative.    Physical Exam Updated Vital Signs BP 121/80   Pulse 91   Temp 98.4 F (36.9 C) (Oral)   Resp 16   LMP 05/09/2010   SpO2 100%  Physical Exam Vitals signs and nursing note reviewed.  Constitutional:      General: She is not in acute distress.    Appearance: She is well-developed.  HENT:     Head: Normocephalic and atraumatic.  Neck:     Musculoskeletal: Neck supple.  Cardiovascular:     Heart sounds: Normal heart sounds. No murmur.     Comments: Mildly tachycardic, but regular. Pulmonary:     Effort: Pulmonary effort is normal. No respiratory distress.     Breath sounds: Normal breath sounds.  Abdominal:     General: There is no distension.     Palpations: Abdomen is soft.     Tenderness: There is no abdominal tenderness.  Musculoskeletal:     Comments: Asymmetric left lower extremity swelling.  Skin:    General: Skin is warm and dry.  Neurological:     Mental Status: She is alert and oriented to person, place, and time.     Comments: Bilateral lower extremities neurovascularly intact.      ED Treatments / Results  Labs (all labs ordered are listed, but only abnormal results are displayed) Labs Reviewed  CBC WITH DIFFERENTIAL/PLATELET - Abnormal; Notable for the following components:      Result Value   WBC 22.0 (*)    RBC 3.45 (*)    Hemoglobin 9.8 (*)    HCT 32.7 (*)    RDW 21.6 (*)    Platelets 502  (*)    nRBC 1.7 (*)    Neutro Abs 19.5 (*)    Abs Immature Granulocytes 0.78 (*)    All other components within normal limits  BASIC METABOLIC PANEL - Abnormal; Notable for the following components:   CO2 19 (*)    Glucose, Bld 161 (*)    All other components within normal limits  CK  HEPATIC FUNCTION PANEL    EKG EKG Interpretation  Date/Time:  Friday October 10 2018 08:34:15 EDT Ventricular Rate:  101 PR Interval:    QRS Duration: 71 QT Interval:  325 QTC Calculation: 422 R Axis:   5 Text Interpretation:  Age not entered, assumed to be  63 years old for purpose of ECG interpretation Sinus tachycardia Probable left atrial enlargement Low voltage, precordial leads ST elevation, consider inferior injury Confirmed by Gerlene Fee [phone]) on 10/10/2018 11:29:20 AM   Radiology Vas [place] Lower Extremity Venous (dvt) (only [facility])  Result Date: 10/10/2018  Lower Venous Study Indications: Pain, and Edema.  Limitations: Body habitus and Edema. Comparison Study: Previous study 07/27/2017 negative bilaterally Performing Technologist: Toma Copier RVS  Examination Guidelines: A complete evaluation includes B-mode imaging, spectral Doppler, color Doppler, and power Doppler as needed of all accessible portions of each vessel. Bilateral testing is considered an integral part of a complete examination. Limited examinations for reoccurring indications may be performed as noted.  +-----+---------------+---------+-----------+----------+-------+ RIGHTCompressibilityPhasicitySpontaneityPropertiesSummary +-----+---------------+---------+-----------+----------+-------+ CFV  Full           Yes      Yes                          +-----+---------------+---------+-----------+----------+-------+ SFJ  Full                                                 +-----+---------------+---------+-----------+----------+-------+    +---------+---------------+---------+-----------+----------+-------------------+ LEFT     CompressibilityPhasicitySpontaneityPropertiesSummary             +---------+---------------+---------+-----------+----------+-------------------+  CFV      Full                                                             +---------+---------------+---------+-----------+----------+-------------------+ SFJ      Full                                                             +---------+---------------+---------+-----------+----------+-------------------+ FV Prox  Full                                                             +---------+---------------+---------+-----------+----------+-------------------+ FV Mid   Full                                                             +---------+---------------+---------+-----------+----------+-------------------+ FV DistalFull                                         Techncially                                                               difficult to image  +---------+---------------+---------+-----------+----------+-------------------+ PFV      Full                                                             +---------+---------------+---------+-----------+----------+-------------------+ POP      Full                                                             +---------+---------------+---------+-----------+----------+-------------------+ PTV      Full                                                             +---------+---------------+---------+-----------+----------+-------------------+ PERO  Techncially                                                               difficult to image                                                        fully               +---------+---------------+---------+-----------+----------+-------------------+    Left Technical Findings: Not visualized segments include Peroneal and distal femoral. Techncially difficult to image the distal femoral and peroneal veins due to body habitus and edema   Summary: Right: There is no evidence of a common femoral vein obstruction. Left: There is no evidence of deep vein thrombosis in the lower extremity. However, portions of this examination were limited- see technologist comments above. No cystic structure found in the popliteal fossa.  *See table(s) above for measurements and observations.    Preliminary     Procedures Procedures (including critical care time)  Medications Ordered in ED Medications  oxyCODONE-acetaminophen (PERCOCET/ROXICET) 5-325 MG per tablet 2 tablet (2 tablets Oral Given 10/10/18 1137)  iohexol (OMNIPAQUE) 300 MG/ML solution 100 mL (100 mLs Intravenous Contrast Given 10/10/18 1546)     Initial Impression / Assessment and Plan / ED Course  I have reviewed the triage vital signs and the nursing notes.  Pertinent labs & imaging results that were available during my care of the patient were reviewed by me and considered in my medical decision making (see chart for details).       Jacqueline Reese is a 63 y.o. female who presents to ED for progressively worsening left lower extremity swelling over the last week.  On exam, she does have asymmetric unilateral left lower extremity swelling as well as tenderness to the calf and inner thigh.  Per chart review, she was seen in the emergency department for lower extremity pain on 7/27 which seemed more consistent with her chronic pain.  Very clear documentation at that time that there was no asymmetry or lower extremity edema.  This does appear to be an acute change.  She has no history of DVT/PE in the past, however does report that due to her progressively worsening arthritis in her hip, she has been very sedentary recently as she has significant amount of pain with ambulation.  Her orthopedist actually  did order her a prescription for wheelchair as well. Will obtain vascular ultrasound to assess for DVT and check basic labs.     DVT study negative. Labs with leukocytosis of 22.0. Given the cytosis with asymmetric leg swelling and leg pain, will proceed with further imaging of her LLE to further evaluate.   CT pending at shift change. Care assumed by oncoming provider PA [facility] pending CT. Case discussed, plan agreed upon.   Patient discussed with Dr. Sedonia Small who agrees with treatment plan.   Final Clinical Impressions(s) / ED Diagnoses   Final diagnoses:  None    ED Discharge Orders    None       Ward, Ozella Almond, PA-C 10/10/18 1555    Maudie Flakes, MD  10/13/18 0450  

## 2018-10-10 NOTE — ED Triage Notes (Signed)
Presents with 1 week hx of edema to LLE with inability to WB.  Pain noted an 8.  3+ eedma noted to LLE , 1+ to RLE.   Denies any injury, SOB or chest pain.

## 2018-10-11 ENCOUNTER — Encounter (HOSPITAL_COMMUNITY): Payer: Self-pay

## 2018-10-11 DIAGNOSIS — F329 Major depressive disorder, single episode, unspecified: Secondary | ICD-10-CM | POA: Diagnosis present

## 2018-10-11 DIAGNOSIS — E119 Type 2 diabetes mellitus without complications: Secondary | ICD-10-CM | POA: Diagnosis present

## 2018-10-11 DIAGNOSIS — I10 Essential (primary) hypertension: Secondary | ICD-10-CM | POA: Diagnosis present

## 2018-10-11 DIAGNOSIS — Z7982 Long term (current) use of aspirin: Secondary | ICD-10-CM | POA: Diagnosis not present

## 2018-10-11 DIAGNOSIS — L03116 Cellulitis of left lower limb: Secondary | ICD-10-CM | POA: Diagnosis present

## 2018-10-11 DIAGNOSIS — F17210 Nicotine dependence, cigarettes, uncomplicated: Secondary | ICD-10-CM | POA: Diagnosis present

## 2018-10-11 DIAGNOSIS — Z7984 Long term (current) use of oral hypoglycemic drugs: Secondary | ICD-10-CM | POA: Diagnosis not present

## 2018-10-11 DIAGNOSIS — M6281 Muscle weakness (generalized): Secondary | ICD-10-CM | POA: Diagnosis not present

## 2018-10-11 DIAGNOSIS — R748 Abnormal levels of other serum enzymes: Secondary | ICD-10-CM | POA: Diagnosis present

## 2018-10-11 DIAGNOSIS — F209 Schizophrenia, unspecified: Secondary | ICD-10-CM | POA: Diagnosis present

## 2018-10-11 DIAGNOSIS — M109 Gout, unspecified: Secondary | ICD-10-CM | POA: Diagnosis present

## 2018-10-11 DIAGNOSIS — Z20828 Contact with and (suspected) exposure to other viral communicable diseases: Secondary | ICD-10-CM | POA: Diagnosis present

## 2018-10-11 DIAGNOSIS — Z833 Family history of diabetes mellitus: Secondary | ICD-10-CM | POA: Diagnosis not present

## 2018-10-11 DIAGNOSIS — Z7952 Long term (current) use of systemic steroids: Secondary | ICD-10-CM | POA: Diagnosis not present

## 2018-10-11 DIAGNOSIS — A419 Sepsis, unspecified organism: Secondary | ICD-10-CM | POA: Diagnosis present

## 2018-10-11 DIAGNOSIS — F419 Anxiety disorder, unspecified: Secondary | ICD-10-CM | POA: Diagnosis present

## 2018-10-11 LAB — COMPREHENSIVE METABOLIC PANEL
ALT: 39 U/L (ref 0–44)
AST: 33 U/L (ref 15–41)
Albumin: 2.8 g/dL — ABNORMAL LOW (ref 3.5–5.0)
Alkaline Phosphatase: 72 U/L (ref 38–126)
Anion gap: 11 (ref 5–15)
BUN: 10 mg/dL (ref 8–23)
CO2: 23 mmol/L (ref 22–32)
Calcium: 8.8 mg/dL — ABNORMAL LOW (ref 8.9–10.3)
Chloride: 103 mmol/L (ref 98–111)
Creatinine, Ser: 0.54 mg/dL (ref 0.44–1.00)
GFR calc Af Amer: >60 mL/min (ref >60)
GFR calc non Af Amer: >60 mL/min (ref >60)
Glucose, Bld: 91 mg/dL (ref 70–99)
Potassium: 3.5 mmol/L (ref 3.5–5.1)
Sodium: 137 mmol/L (ref 135–145)
Total Bilirubin: 0.6 mg/dL (ref 0.3–1.2)
Total Protein: 5.3 g/dL — ABNORMAL LOW (ref 6.5–8.1)

## 2018-10-11 LAB — CK: Total CK: 702 U/L — ABNORMAL HIGH (ref 38–234)

## 2018-10-11 LAB — HIV ANTIBODY (ROUTINE TESTING W REFLEX): HIV Screen 4th Generation wRfx: NONREACTIVE

## 2018-10-11 LAB — CBC
HCT: 28.7 % — ABNORMAL LOW (ref 36.0–46.0)
Hemoglobin: 8.7 g/dL — ABNORMAL LOW (ref 12.0–15.0)
MCH: 28.1 pg (ref 26.0–34.0)
MCHC: 30.3 g/dL (ref 30.0–36.0)
MCV: 92.6 fL (ref 80.0–100.0)
Platelets: 485 10*3/uL — ABNORMAL HIGH (ref 150–400)
RBC: 3.1 MIL/uL — ABNORMAL LOW (ref 3.87–5.11)
RDW: 21 % — ABNORMAL HIGH (ref 11.5–15.5)
WBC: 16.4 10*3/uL — ABNORMAL HIGH (ref 4.0–10.5)
nRBC: 1.6 % — ABNORMAL HIGH (ref 0.0–0.2)

## 2018-10-11 LAB — GLUCOSE, CAPILLARY
Glucose-Capillary: 90 mg/dL (ref 70–99)
Glucose-Capillary: 103 mg/dL — ABNORMAL HIGH (ref 70–99)
Glucose-Capillary: 115 mg/dL — ABNORMAL HIGH (ref 70–99)
Glucose-Capillary: 104 mg/dL — ABNORMAL HIGH (ref 70–99)

## 2018-10-11 LAB — SARS CORONAVIRUS 2 (TAT 6-24 HRS): SARS Coronavirus 2: NEGATIVE

## 2018-10-11 MED ORDER — FENTANYL CITRATE (PF) 100 MCG/2ML IJ SOLN
50.0000 ug | Freq: Once | INTRAMUSCULAR | Status: AC
  Administered 2018-10-11: 01:00:00 50 ug via INTRAVENOUS
  Filled 2018-10-11: qty 2

## 2018-10-11 MED ORDER — TRAZODONE HCL 50 MG PO TABS
50.0000 mg | ORAL_TABLET | Freq: Once | ORAL | Status: AC
  Administered 2018-10-11: 22:00:00 50 mg via ORAL
  Filled 2018-10-11: qty 1

## 2018-10-11 NOTE — Evaluation (Signed)
Physical Therapy Evaluation Patient Details Name: Jacqueline Reese MRN: [id number] DOB: 1955/06/25 Today's Date: 10/11/2018   History of Present Illness  63 yo admitted with LLE cellulitis. PMhx: chronic back pain, schizophrenia, gout, HTN, depression, DM  Clinical Impression  Pt supine on arrival reporting RLE pain. Pt states pain switches between bil LE and that she has a hard time moving. Pt currently demonstrating extremely limited mobility of bil LE with assist required to sit EOB and pivot to toilet. Pt unable to maintain standing or take significant steps this session. Pt lives alone and does not have 24hr assist at home. Pt with decreased strength, transfers, function and cognition who will benefit from acute therapy to maximize mobility and safety to decrease burden of care.      Follow Up Recommendations SNF;Supervision/Assistance - 24 hour    Equipment Recommendations  Rolling walker with 5" wheels;3in1 (PT)    Recommendations for Other Services OT consult     Precautions / Restrictions Precautions Precautions: Fall Restrictions Weight Bearing Restrictions: No      Mobility  Bed Mobility Overal bed mobility: Needs Assistance Bed Mobility: Supine to Sit     Supine to sit: Mod assist     General bed mobility comments: mod assist to pivot to EOB with increased time and use of rail with HOB 25 degrees  Transfers Overall transfer level: Needs assistance   Transfers: Squat Pivot Transfers     Squat pivot transfers: Mod assist     General transfer comment: mod assist to pivot to left from bed >BSC>recliner. pt with cues and assist to rise and pivot. Pt hesitant to put weight on RLE and denied standing fully due to pain  Ambulation/Gait             General Gait Details: unable due to pain  Stairs            Wheelchair Mobility    Modified Rankin (Stroke Patients Only)       Balance Overall balance assessment: Needs assistance   Sitting  balance-Leahy Scale: Fair     Standing balance support: Bilateral upper extremity supported Standing balance-Leahy Scale: Poor                               Pertinent Vitals/Pain Pain Assessment: 0-10 Pain Score: 5  Pain Location: RLE Pain Descriptors / Indicators: Aching;Constant;Pressure Pain Intervention(s): Limited activity within patient's tolerance;Repositioned;Monitored during session    Home Living Family/patient expects to be discharged to:: Private residence Living Arrangements: Alone   Type of Home: House Home Access: Stairs to enter Entrance Stairs-Rails: Psychiatric nurse of Steps: 5 Home Layout: One level Home Equipment: None      Prior Function Level of Independence: Independent               Hand Dominance        Extremity/Trunk Assessment   Upper Extremity Assessment Upper Extremity Assessment: Generalized weakness    Lower Extremity Assessment Lower Extremity Assessment: Generalized weakness    Cervical / Trunk Assessment Cervical / Trunk Assessment: Normal  Communication   Communication: No difficulties  Cognition Arousal/Alertness: Awake/alert Behavior During Therapy: WFL for tasks assessed/performed Overall Cognitive Status: Impaired/Different from baseline Area of Impairment: Safety/judgement;Following commands;Problem solving;Awareness                       Following Commands: Follows one step commands with increased time Safety/Judgement: Decreased awareness  of safety;Decreased awareness of deficits   Problem Solving: Slow processing;Decreased initiation;Requires verbal cues;Requires tactile cues        General Comments      Exercises     Assessment/Plan    PT Assessment Patient needs continued PT services  PT Problem List Decreased strength;Decreased mobility;Decreased safety awareness;Decreased activity tolerance;Decreased range of motion;Decreased cognition;Decreased  balance;Decreased knowledge of use of DME;Pain       PT Treatment Interventions Gait training;Therapeutic exercise;Patient/family education;Balance training;Functional mobility training;DME instruction;Therapeutic activities;Cognitive remediation;Stair training    PT Goals (Current goals can be found in the Care Plan section)  Acute Rehab PT Goals Patient Stated Goal: be able to walk PT Goal Formulation: With patient Time For Goal Achievement: 10/25/18 Potential to Achieve Goals: Fair    Frequency Min 3X/week   Barriers to discharge Decreased caregiver support      Co-evaluation               AM-PAC PT "6 Clicks" Mobility  Outcome Measure Help needed turning from your back to your side while in a flat bed without using bedrails?: A Lot Help needed moving from lying on your back to sitting on the side of a flat bed without using bedrails?: A Lot Help needed moving to and from a bed to a chair (including a wheelchair)?: A Lot Help needed standing up from a chair using your arms (e.g., wheelchair or bedside chair)?: A Lot Help needed to walk in hospital room?: Total Help needed climbing 3-5 steps with a railing? : Total 6 Click Score: 10    End of Session Equipment Utilized During Treatment: Gait belt Activity Tolerance: Patient limited by pain;Patient tolerated treatment well Patient left: in chair;with call bell/phone within reach;with nursing/sitter in room(chair alarm pad under pt, not box present, NT in room and aware) Nurse Communication: Mobility status;Precautions PT Visit Diagnosis: Other abnormalities of gait and mobility (R26.89);Muscle weakness (generalized) (M62.81);Unsteadiness on feet (R26.81);Pain Pain - Right/Left: Right Pain - part of body: Leg    Time: 6468-0321 PT Time Calculation (min) (ACUTE ONLY): 26 min   Charges:   PT Evaluation $PT Eval Moderate Complexity: 1 Mod PT Treatments $Therapeutic Activity: 8-22 mins        Mihika Surrette Pam Drown,  PT Acute Rehabilitation Services Pager: [phone] Office: [phone]   Fishel Wamble B Vara Mairena 10/11/2018, 1:02 PM

## 2018-10-11 NOTE — Progress Notes (Signed)
PROGRESS NOTE    [name]  LPF:[id number] DOB: 1955-08-14 DOA: 10/10/2018 PCP: Nolene Ebbs, MD   Brief Narrative: Jacqueline Reese is a 63 y.o. female with medical history significant of schizophrenia, diabetes mellitus, anxiety, depression, arthritis, hypertension, gout. Patient presented secondary cellulitis.   Assessment & Plan:   Active Problems:   Cellulitis   Cellulitis Unknown etiology. No evidence of abscess. Patient with significant pain and unable to ambulate well. No prior treatment as an outpatient. Blood culture no growth x <24 hours -Continue ceftriaxone -Blood cultures pending  Sepsis Present on admission. Secondary to leukocytosis and tachycardia. Likely secondary to above. Blood culture pending. Afebrile. -CBC in AM  Elevated CK Secondary to cellulitis. Mild elevation. Improved.  Elevated AST/ALT In setting of elevated CK. Mild elevation. Improved.  Diabetes mellitus, type 2 Patient is on metformin. Hemoglobin A1C of 6.9% -SSI qAC -Restart metformin at least two days after contrast imaging.  Gout No flare -Continue allopurinol  Essential hypertension -Continue amlodipine, spironolactone  Mood disorder Schizophrenia -Continue Klonopin, Remeron, Buspar -Continue Geodon   DVT prophylaxis: Lovenox Code Status:   Code Status: Full Code Family Communication: None Disposition Plan: Discharge to SNF   Consultants:   None  Procedures:   None  Antimicrobials:  Ceftriaxone   Vancomycin   Subjective: Leg feels better than yesterday. Still painful when moving.  Objective: Vitals:   10/10/18 1915 10/10/18 2046 10/11/18 0429 10/11/18 1438  BP: 107/70 98/64 102/66 111/70  Pulse:  75 78 98  Resp: (!) 22 17 16 18   Temp:  99.1 F (37.3 C) 98.1 F (36.7 C) 98.8 F (37.1 C)  TempSrc:  Oral Oral Oral  SpO2:  100% 90% 96%  Weight:  86.1 kg    Height:  5\' 2"  (1.575 m)      Intake/Output Summary (Last 24 hours)  at 10/11/2018 1551 Last data filed at 10/11/2018 1300 Gross per 24 hour  Intake 1108.98 ml  Output --  Net 1108.98 ml   Filed Weights   10/10/18 1700 10/10/18 2046  Weight: 64.9 kg 86.1 kg    Examination:  General exam: Appears calm and comfortable Respiratory system: Clear to auscultation. Respiratory effort normal. Cardiovascular system: S1 & S2 heard, RRR. No murmurs, rubs, gallops or clicks. Gastrointestinal system: Abdomen is nondistended, soft and nontender. No organomegaly or masses felt. Normal bowel sounds heard. Central nervous system: Alert and oriented. No focal neurological deficits. Extremities: No calf tenderness. Bilateral edema with left lower medial thigh tenderness Skin: No cyanosis. No rashes Psychiatry: Judgement and insight appear normal. Mood & affect appropriate.     Data Reviewed: I have personally reviewed following labs and imaging studies  CBC: Recent Labs  Lab 10/10/18 0844 10/11/18 0236  WBC 22.0* 16.4*  NEUTROABS 19.5*  --   HGB 9.8* 8.7*  HCT 32.7* 28.7*  MCV 94.8 92.6  PLT 502* 532*   Basic Metabolic Panel: Recent Labs  Lab 10/10/18 0844 10/11/18 0236  NA 138 137  K 3.9 3.5  CL 104 103  CO2 19* 23  GLUCOSE 161* 91  BUN 17 10  CREATININE 0.78 0.54  CALCIUM 9.2 8.8*   GFR: Estimated Creatinine Clearance: 74.2 mL/min (by C-G formula based on SCr of 0.54 mg/dL). Liver Function Tests: Recent Labs  Lab 10/10/18 1638 10/11/18 0236  AST 45* 33  ALT 46* 39  ALKPHOS 75 72  BILITOT 0.7 0.6  PROT 5.9* 5.3*  ALBUMIN 3.0* 2.8*   No results for input(s): LIPASE, AMYLASE in  the last 168 hours. No results for input(s): AMMONIA in the last 168 hours. Coagulation Profile: No results for input(s): INR, PROTIME in the last 168 hours. Cardiac Enzymes: Recent Labs  Lab 10/10/18 1638 10/11/18 0236  CKTOTAL 889* 702*   BNP (last 3 results) No results for input(s): PROBNP in the last 8760 hours. HbA1C: Recent Labs    10/10/18 2116   HGBA1C 6.9*   CBG: Recent Labs  Lab 10/10/18 2053 10/11/18 0747 10/11/18 1209  GLUCAP 100* 90 103*   Lipid Profile: No results for input(s): CHOL, HDL, LDLCALC, TRIG, CHOLHDL, LDLDIRECT in the last 72 hours. Thyroid Function Tests: No results for input(s): TSH, T4TOTAL, FREET4, T3FREE, THYROIDAB in the last 72 hours. Anemia Panel: No results for input(s): VITAMINB12, FOLATE, FERRITIN, TIBC, IRON, RETICCTPCT in the last 72 hours. Sepsis Labs: Recent Labs  Lab 10/10/18 1615 10/10/18 1815  LATICACIDVEN 2.3* 2.3*    Recent Results (from the past 240 hour(s))  Blood culture (routine x 2)     Status: None (Preliminary result)   Collection Time: 10/10/18  4:25 PM   Specimen: BLOOD RIGHT HAND  Result Value Ref Range Status   Specimen Description BLOOD RIGHT HAND  Final   Special Requests   Final    BOTTLES DRAWN AEROBIC AND ANAEROBIC Blood Culture results may not be optimal due to an inadequate volume of blood received in culture bottles   Culture   Final    NO GROWTH < 24 HOURS Performed at [facility], [place] [address] [place], [place] [address]    Report Status PENDING  Incomplete  Blood culture (routine x 2)     Status: None (Preliminary result)   Collection Time: 10/10/18  4:38 PM   Specimen: BLOOD  Result Value Ref Range Status   Specimen Description BLOOD L IV SITE  Final   Special Requests   Final    BOTTLES DRAWN AEROBIC AND ANAEROBIC Blood Culture results may not be optimal due to an inadequate volume of blood received in culture bottles   Culture   Final    NO GROWTH < 24 HOURS Performed at [facility], [place] [address], [place], [place] [address]    Report Status PENDING  Incomplete  SARS CORONAVIRUS 2 Nasal Swab Aptima Multi Swab     Status: None   Collection Time: 10/10/18  5:20 PM   Specimen: Aptima Multi Swab; Nasal Swab  Result Value Ref Range Status   SARS Coronavirus 2 NEGATIVE NEGATIVE Final    Comment: (NOTE) SARS-CoV-2 target nucleic  acids are NOT DETECTED. The SARS-CoV-2 RNA is generally detectable in upper and lower respiratory specimens during the acute phase of infection. Negative results do not preclude SARS-CoV-2 infection, do not rule out co-infections with other pathogens, and should not be used as the sole basis for treatment or other patient management decisions. Negative results must be combined with clinical observations, patient history, and epidemiological information. The expected result is Negative. Fact Sheet for Patients: SugarRoll.be Fact Sheet for Healthcare Providers: https://www.woods-mathews.com/ This test is not yet approved or cleared by the [place] FDA and  has been authorized for detection and/or diagnosis of SARS-CoV-2 by FDA under an Emergency Use Authorization (EUA). This EUA will remain  in effect (meaning this test can be used) for the duration of the COVID-19 declaration under Section 56 4(b)(1) of the Act, 21 U.S.C. section 360bbb-3(b)(1), unless the authorization is terminated or revoked sooner. Performed at [facility], [place]  [address] [place], [place] [address]          Radiology Studies: Ct Extremity Lower Left W Contrast  Result Date: 10/10/2018 CLINICAL DATA:  Leg swelling and leukocytosis EXAM: CT OF THE LOWER LEFT EXTREMITY WITH CONTRAST TECHNIQUE: Multidetector CT imaging of the lower left extremity was performed according to the standard protocol following intravenous contrast administration. COMPARISON:  None. CONTRAST:  131mL OMNIPAQUE IOHEXOL 300 MG/ML  SOLN FINDINGS: Bones/Joint/Cartilage No fracture or dislocation. There is mild medial and patellofemoral compartment osteoarthritis with joint space loss and marginal osteophyte formation. Enthesophytes seen at the superior patellar pole. There is also calcaneal enthesophytes seen. Normal bone mineralization seen throughout. No areas of cortical destruction or  erosion are seen. Ligaments Suboptimally assessed by CT. Muscles and Tendons There is mild fatty atrophy noted within the musculature surrounding the lower extremity. However the muscles appear to be intact. The tendons are intact. Soft tissues There is diffuse subcutaneous edema seen surrounding the lower extremity with areas of skin thickening. There is mild loculated fluid seen surrounding the mid tibia/fibula within the subcutaneous tissues. No loculated fluid collection or extension into the deep fascial layers is seen. IMPRESSION: Findings of cellulitis with phlegmon in the mid tibia/fibula subcutaneous tissues. No evidence of abscess or osteomyelitis. Electronically Signed   By: Prudencio Pair M.D.   On: 10/10/2018 16:22   Vas [place] Lower Extremity Venous (dvt) (only [facility])  Result Date: 10/10/2018  Lower Venous Study Indications: Pain, and Edema.  Limitations: Body habitus and Edema. Comparison Study: Previous study 07/27/2017 negative bilaterally Performing Technologist: Toma Copier RVS  Examination Guidelines: A complete evaluation includes B-mode imaging, spectral Doppler, color Doppler, and power Doppler as needed of all accessible portions of each vessel. Bilateral testing is considered an integral part of a complete examination. Limited examinations for reoccurring indications may be performed as noted.  +-----+---------------+---------+-----------+----------+-------+  RIGHT Compressibility Phasicity Spontaneity Properties Summary  +-----+---------------+---------+-----------+----------+-------+  CFV   Full            Yes       Yes                             +-----+---------------+---------+-----------+----------+-------+  SFJ   Full                                                      +-----+---------------+---------+-----------+----------+-------+   +---------+---------------+---------+-----------+----------+-------------------+  LEFT       Compressibility Phasicity Spontaneity Properties Summary              +---------+---------------+---------+-----------+----------+-------------------+  CFV       Full                                                                  +---------+---------------+---------+-----------+----------+-------------------+  SFJ       Full                                                                  +---------+---------------+---------+-----------+----------+-------------------+  FV Prox   Full                                                                  +---------+---------------+---------+-----------+----------+-------------------+  FV Mid    Full                                                                  +---------+---------------+---------+-----------+----------+-------------------+  FV Distal Full                                             Techncially                                                                      difficult to image   +---------+---------------+---------+-----------+----------+-------------------+  PFV       Full                                                                  +---------+---------------+---------+-----------+----------+-------------------+  POP       Full                                                                  +---------+---------------+---------+-----------+----------+-------------------+  PTV       Full                                                                  +---------+---------------+---------+-----------+----------+-------------------+  [employer]  difficult to image                                                               fully                +---------+---------------+---------+-----------+----------+-------------------+   Left Technical Findings: Not visualized segments include Peroneal and distal femoral.  Techncially difficult to image the distal femoral and peroneal veins due to body habitus and edema   Summary: Right: There is no evidence of a common femoral vein obstruction. Left: There is no evidence of deep vein thrombosis in the lower extremity. However, portions of this examination were limited- see technologist comments above. No cystic structure found in the popliteal fossa.  *See table(s) above for measurements and observations.    Preliminary         Scheduled Meds:  allopurinol  100 mg Oral BID   amLODipine  5 mg Oral Daily   aspirin EC  325 mg Oral Daily   busPIRone  10 mg Oral Daily   clonazePAM  1 mg Oral QID   enoxaparin (LOVENOX) injection  40 mg Subcutaneous Q24H   gabapentin  600 mg Oral TID   insulin aspart  0-15 Units Subcutaneous TID WC   linaclotide  145 mcg Oral Daily   methocarbamol  750 mg Oral BID   mirtazapine  30 mg Oral QHS   rosuvastatin  5 mg Oral Daily   spironolactone  50 mg Oral Daily   ziprasidone  160 mg Oral QPM   Continuous Infusions:  cefTRIAXone (ROCEPHIN)  IV Stopped (10/10/18 2052)     LOS: 0 days     Cordelia Poche, MD [facility] 10/11/2018, 3:51 PM  If 7PM-7AM, please contact night-coverage [email or web address]

## 2018-10-11 NOTE — Plan of Care (Signed)
  Problem: Education: Goal: Knowledge of General Education information will improve Description: Including pain rating scale, medication(s)/side effects and non-pharmacologic comfort measures Outcome: Progressing   Problem: Health Behavior/Discharge Planning: Goal: Ability to manage health-related needs will improve Outcome: Progressing   Problem: Clinical Measurements: Goal: Ability to maintain clinical measurements within normal limits will improve Outcome: Progressing Goal: Will remain free from infection Outcome: Progressing   Problem: Activity: Goal: Risk for activity intolerance will decrease Outcome: Progressing   Problem: Nutrition: Goal: Adequate nutrition will be maintained Outcome: Progressing   Problem: Coping: Goal: Level of anxiety will decrease Outcome: Progressing   Problem: Elimination: Goal: Will not experience complications related to urinary retention Outcome: Progressing   Problem: Pain Managment: Goal: General experience of comfort will improve Outcome: Progressing   Problem: Safety: Goal: Ability to remain free from injury will improve Outcome: Progressing   Problem: Clinical Measurements: Goal: Ability to avoid or minimize complications of infection will improve Outcome: Progressing   Problem: Skin Integrity: Goal: Skin integrity will improve Outcome: Progressing

## 2018-10-12 ENCOUNTER — Inpatient Hospital Stay (HOSPITAL_COMMUNITY): Payer: Medicare Other

## 2018-10-12 LAB — GLUCOSE, CAPILLARY
Glucose-Capillary: 159 mg/dL — ABNORMAL HIGH (ref 70–99)
Glucose-Capillary: 105 mg/dL — ABNORMAL HIGH (ref 70–99)
Glucose-Capillary: 103 mg/dL — ABNORMAL HIGH (ref 70–99)

## 2018-10-12 MED ORDER — MELOXICAM 15 MG PO TABS: 15.0000 mg | ORAL_TABLET | Freq: Every day | ORAL | Status: AC

## 2018-10-12 MED ORDER — METHOCARBAMOL 750 MG PO TABS: 750.0000 mg | ORAL_TABLET | Freq: Two times a day (BID) | ORAL | 0 refills | Status: AC

## 2018-10-12 MED ORDER — CEFDINIR 300 MG PO CAPS: 300.0000 mg | ORAL_CAPSULE | Freq: Two times a day (BID) | ORAL | 0 refills | Status: AC

## 2018-10-12 MED ORDER — METFORMIN HCL 500 MG PO TABS: 500.0000 mg | ORAL_TABLET | Freq: Two times a day (BID) | ORAL | Status: AC

## 2018-10-12 MED ORDER — DICLOFENAC SODIUM 1 % TD GEL
4.0000 g | Freq: Four times a day (QID) | TRANSDERMAL | Status: DC
  Administered 2018-10-12 (×2): 4 g via TOPICAL
  Filled 2018-10-12: qty 100

## 2018-10-12 NOTE — TOC Initial Note (Signed)
Transition of Care [facility]) - Initial/Assessment Note    Patient Details  Name: Jacqueline Reese MRN: [id number] Date of Birth: January 30, 1956  [facility]) CM/SW Contact:    Benard Halsted, LCSW Phone Number: 10/12/2018, 11:32 AM  Clinical Narrative:                 CSW received consult for possible SNF at time of discharge. CSW spoke with patient regarding PT recommendation of SNF at time of discharge. Patient reported that she does not want to go to SNF and would like home health services. She states her daughter will be assisting her at home. Patient reports preference for [facility] since she has used them before and liked their care. CSW will send referral for review. CSW provided Medicare ratings list. CSW discussed equipment needs with patient and she requested a rolling walker and 3in1. CSW will reach out to [employer] for delivery to the room. CSW confirmed PCP and address with patient. Patient states she will require PTAR for transportation at discharge. She also requests assistance getting her medications before she leaves. CSW will inquire if [facility] pharmacy can assist. No further questions reported at this time. CSW to continue to follow and assist with discharge planning needs.   Expected Discharge Plan: [place] Barriers to Discharge: Continued Medical Work up   Patient Goals and CMS Choice Patient states their goals for this hospitalization and ongoing recovery are:: Return home CMS Medicare.gov Compare Post Acute Care list provided to:: Patient Choice offered to / list presented to : Patient  Expected Discharge Plan and Services Expected Discharge Plan: [place] In-house Referral: Clinical Social Work Discharge Planning Services: CM Consult, Medication Assistance Post Acute Care Choice: [place] arrangements for the past 2 months: Single Family Home                                      Prior Living  Arrangements/Services Living arrangements for the past 2 months: Single Family Home Lives with:: Self Patient language and need for interpreter reviewed:: Yes Do you feel safe going back to the place where you live?: Yes      Need for Family Participation in Patient Care: Yes (Comment) Care giver support system in place?: Yes (comment)   Criminal Activity/Legal Involvement Pertinent to Current Situation/Hospitalization: No - Comment as needed  Activities of Daily Living Home Assistive Devices/Equipment: None ADL Screening (condition at time of admission) Patient's cognitive ability adequate to safely complete daily activities?: Yes Is the patient deaf or have difficulty hearing?: No Does the patient have difficulty seeing, even when wearing glasses/contacts?: No Does the patient have difficulty concentrating, remembering, or making decisions?: No Patient able to express need for assistance with ADLs?: No Does the patient have difficulty dressing or bathing?: No Independently performs ADLs?: Yes (appropriate for developmental age) Does the patient have difficulty walking or climbing stairs?: Yes Weakness of Legs: Both Weakness of Arms/Hands: None  Permission Sought/Granted Permission sought to share information with : Facility Sport and exercise psychologist, Family Supports Permission granted to share information with : Yes, Verbal Permission Granted     Permission granted to share info w AGENCY: Home Health        Emotional Assessment Appearance:: Appears stated age Attitude/Demeanor/Rapport: Gracious, Engaged Affect (typically observed): Accepting, Adaptable, Pleasant, Appropriate Orientation: : Oriented to Self, Oriented to Place, Oriented to  Time, Oriented to Situation Alcohol / Substance Use: Not Applicable Psych Involvement: No (comment)  Admission diagnosis:  Cellulitis of left leg [L03.116] Patient Active Problem List   Diagnosis Date Noted  . Cellulitis 10/10/2018  .  Primary osteoarthritis of left hip 10/08/2018  . Primary osteoarthritis of right hip 10/08/2018  . Angioedema 09/13/2015  . Anaphylaxis   . Hypertension, essential   . Acute hypoxemic respiratory failure ([place])   . Type 2 diabetes mellitus without complication, without long-term current use of insulin ([place])   . DIAB W/O MENTION COMP TYPE II/UNS TYPE UNCNTRL 01/19/2010  . FOOT ULCER, RIGHT 01/19/2010  . MENOPAUSE-RELATED VASOMOTOR SYMPTOMS, HOT FLASHES 10/04/2009  . GOUT, UNSPECIFIED 06/07/2009  . LICHEN SIMPLEX CHRONICUS 11/30/2008  . OTITIS EXTERNA, CHRONIC 04/16/2008  . OBESITY, NOS 05/02/2006  . SCHIZOPHRENIA 05/02/2006  . HYPERTENSION, BENIGN SYSTEMIC 05/02/2006  . GASTROESOPHAGEAL REFLUX, NO ESOPHAGITIS 05/02/2006   PCP:  Nolene Ebbs, MD Pharmacy:   [place], [place] [address] Phone: [phone] Fax: [phone]  [place], [place] [address] [place] [address] Phone: [phone] Fax: [phone]     Social Determinants of Health (SDOH) Interventions    Readmission Risk Interventions No flowsheet data found.

## 2018-10-12 NOTE — Progress Notes (Signed)
Marisue Leandro Reasoner to be D/C'd  per MD order. Discussed with the patient and all questions fully answered.  VSS, Skin clean, dry and intact without evidence of skin break down, no evidence of skin tears noted.  IV catheter discontinued intact. Site without signs and symptoms of complications. Dressing and pressure applied.  An After Visit Summary was printed and given to the patient. Patient informed where to pick up prescriptions and home health equipment ordered.  D/c education completed with patient including follow up instructions, medication list, d/c activities limitations if indicated, with other d/c instructions as indicated by MD - patient able to verbalize understanding, all questions fully answered.   Patient instructed to return to ED, call 911, or call MD for any changes in condition.   Patient D/C home via PTAR.

## 2018-10-12 NOTE — Discharge Instructions (Signed)
[place] were in the hospital with cellulitis. You will be discharged with antibiotics.

## 2018-10-12 NOTE — TOC Transition Note (Signed)
Transition of Care [facility]) - CM/SW Discharge Note   Patient Details  Name: Jacqueline Reese MRN: [id number] Date of Birth: 1955-12-28  Transition of Care [facility]) CM/SW Contact:  Jacqueline Leach, Jacqueline Reese Phone Number: 10/12/2018, 5:01 PM   Clinical Narrative:    Pt to d/c home with family and friends to assist.  Patient requests PT, Home health aide, RW and 3n1.  Patient also needs PTAR, which will not take DME.  D/W daughter, who states patient's friend is at her house awaiting her arrival and will stay with her tonight.  Daughter states no one can come to bring DME home.  Attempted to arrange delivery by [facility], but they do not take [employer].  Daughter advised family will need to retrieve RW and 3n1 from DME store tomorrow.  Daughter states they have an old RW and can get by without DME until tomorrow.  Orders printed and sent home with patient to take to store tomorrow.    PTAR transport arranged with Jacqueline Reese for later this evening after IV antibiotics.     Final next level of care: [place] Barriers to Discharge: Continued Medical Work up   Patient Goals and CMS Choice Patient states their goals for this hospitalization and ongoing recovery are:: Return home CMS Medicare.gov Compare Post Acute Care list provided to:: Patient Choice offered to / list presented to : Adult Children   Discharge Plan and Services In-house Referral: Clinical Social Work Discharge Planning Services: CM Consult, Medication Assistance Post Acute Care Choice: Home Health          DME Arranged: Walker rolling, 3-N-1 DME Agency: [employer]       HH Arranged: PT, Nurse's Aide [place] Agency: [place] Date HH Agency Contacted: 10/12/18 Time [place]: 88 Representative spoke with at [place]: ON call service, faxed orders/info  The intake coordinator for Janeece Riggers will assess information tomorrow and call back to CM office if unable to accept  patient.

## 2018-10-12 NOTE — Evaluation (Signed)
Occupational Therapy Evaluation Patient Details Name: Jacqueline Reese MRN: [id number] DOB: 10-Aug-1955 Today's Date: 10/12/2018    History of Present Illness 63 yo admitted with LLE cellulitis. PMhx: chronic back pain, schizophrenia, gout, HTN, depression, DM   Clinical Impression   PTA, pt was living with her boyfriend, daughter, and two grandchildren (73 and 76 yo). Pt reports she was independent with ADLs and IADLs prior to admission. Pt currently requiring Min A for UB ADLs, Mod-Max A for LB ADLs, and Mod A for squat pivot transfer with RW. Pt presenting with poor awareness, problem solving, balance, strength, and activity tolerance impacting her functional performance and safety. Pt excited to dc later today. Discussed obstacles with dc home such as stairs to enter house and pt demonstrating decreased awareness of functional status. Pt would benefit from further acute OT to facilitate safe dc. Recommend dc to SNF for further OT to optimize safety, independence with ADLs, and return to PLOF. However, pt will require HHOT, HHPT, and [employer] aide if dc home.      Follow Up Recommendations  SNF;Supervision/Assistance - 24 hour(Planning for home; refusing SNF; will require HHOT and aide)    Equipment Recommendations  3 in 1 bedside commode    Recommendations for Other Services PT consult     Precautions / Restrictions Precautions Precautions: Fall      Mobility Bed Mobility Overal bed mobility: Needs Assistance Bed Mobility: Supine to Sit     Supine to sit: Min assist     General bed mobility comments: Min A to inititate bringing BLEs towards EOB. Then Max cues for sequencing. Pt requiring SIGNIFICANT time due to poor problem solving  Transfers Overall transfer level: Needs assistance Equipment used: Rolling walker (2 wheeled) Transfers: Squat Pivot Transfers     Squat pivot transfers: Mod assist     General transfer comment: Pt attempting sit<>stand ~15 times at EOB and  each time sitting back down stating the pain is too great. Pt requiring Mod A to power up into squat position with UE on RW. Pt then pivoting to recliner by swing her hips towards chair but maintaining her feet in prior position    Balance Overall balance assessment: Needs assistance Sitting-balance support: No upper extremity supported;Feet supported Sitting balance-Leahy Scale: Good Sitting balance - Comments: Bending forward to apply lotion   Standing balance support: Bilateral upper extremity supported;During functional activity Standing balance-Leahy Scale: Poor                             ADL either performed or assessed with clinical judgement   ADL Overall ADL's : Needs assistance/impaired Eating/Feeding: Independent;Sitting   Grooming: Set up;Sitting   Upper Body Bathing: Minimal assistance;Sitting   Lower Body Bathing: Moderate assistance;Sit to/from stand Lower Body Bathing Details (indicate cue type and reason): Pt bending forward to apply lotion to her BLEs.  Upper Body Dressing : Minimal assistance;Sitting   Lower Body Dressing: Maximal assistance;Sit to/from stand Lower Body Dressing Details (indicate cue type and reason): Max A to doff socks Toilet Transfer: Moderate assistance;Squat-pivot;RW Toilet Transfer Details (indicate cue type and reason): Pt attempting sit<>stand ~15 times at EOB and each time sitting back down stating the pain is too great. Pt requiring Mod A to power up into squat position with UE on RW. Pt then pivoting to recliner by swing her hips towards chair but maintaining her feet in prior position  Functional mobility during ADLs: Moderate assistance;Rolling walker(squat pivot only) General ADL Comments: Pt presenting with poor strength, balance, and activity tolerance     Vision         Perception     Praxis      Pertinent Vitals/Pain Pain Assessment: Faces Faces Pain Scale: Hurts little more Pain Location:  RLE Pain Descriptors / Indicators: Aching;Constant;Pressure Pain Intervention(s): Monitored during session;Limited activity within patient's tolerance;Repositioned     Hand Dominance     Extremity/Trunk Assessment Upper Extremity Assessment Upper Extremity Assessment: Generalized weakness   Lower Extremity Assessment Lower Extremity Assessment: Defer to PT evaluation   Cervical / Trunk Assessment Cervical / Trunk Assessment: Other exceptions Cervical / Trunk Exceptions: Increased bosy habitus   Communication Communication Communication: No difficulties   Cognition Arousal/Alertness: Awake/alert Behavior During Therapy: WFL for tasks assessed/performed Overall Cognitive Status: History of cognitive impairments - at baseline Area of Impairment: Safety/judgement;Following commands;Problem solving;Awareness                       Following Commands: Follows one step commands with increased time Safety/Judgement: Decreased awareness of safety;Decreased awareness of deficits Awareness: Emergent Problem Solving: Slow processing;Decreased initiation;Requires verbal cues;Requires tactile cues General Comments: Pt with baseline schizophrenia. Pt presenting with child life affect and poor awareness of functional deficits. Despite poor fucntional performance during session, pt stating she wants to go home. When presented with "problem areas" for returning home, pt unable to providing unrealistic solution suchs as her 62yo and 14yo grandchildren assisting her up the front stairs to enter house   General Comments       Exercises     Shoulder Instructions      Home Living Family/patient expects to be discharged to:: Private residence Living Arrangements: Spouse/significant other;Children(Grandchildren) Available Help at Discharge: Family;Available 24 hours/day Type of Home: House Home Access: Stairs to enter [employer] of Steps: 5 Entrance Stairs-Rails:  Right;Left Home Layout: One level     Bathroom Shower/Tub: Teacher, early years/pre: Standard     Home Equipment: None          Prior Functioning/Environment Level of Independence: Independent                 OT Problem List: Decreased strength;Decreased range of motion;Decreased activity tolerance;Impaired balance (sitting and/or standing);Decreased knowledge of use of DME or AE;Decreased knowledge of precautions;Decreased safety awareness;Pain      OT Treatment/Interventions: Self-care/ADL training;Therapeutic exercise;Energy conservation;DME and/or AE instruction;Therapeutic activities;Patient/family education    OT Goals(Current goals can be found in the care plan section) Acute Rehab OT Goals Patient Stated Goal: "Go home. I don't want to go to those rehabs. I am scared of COVID." OT Goal Formulation: With patient Time For Goal Achievement: 10/26/18 Potential to Achieve Goals: Good  OT Frequency: Min 2X/week   Barriers to D/C:            Co-evaluation              AM-PAC OT "6 Clicks" Daily Activity     Outcome Measure Help from another person eating meals?: None Help from another person taking care of personal grooming?: A Little Help from another person toileting, which includes using toliet, bedpan, or urinal?: A Lot Help from another person bathing (including washing, rinsing, drying)?: A Lot Help from another person to put on and taking off regular upper body clothing?: A Little Help from another person to put on and taking off regular lower body clothing?:  A Lot 6 Click Score: 16   End of Session Equipment Utilized During Treatment: Gait belt;Rolling walker Nurse Communication: Mobility status  Activity Tolerance: Patient limited by pain Patient left: in chair;with call bell/phone within reach  OT Visit Diagnosis: Unsteadiness on feet (R26.81);Other abnormalities of gait and mobility (R26.89);Muscle weakness (generalized)  (M62.81);Pain Pain - Right/Left: Left Pain - part of body: Leg                Time: 3354-5625 OT Time Calculation (min): 31 min Charges:  OT General Charges $OT Visit: 1 Visit OT Evaluation $OT Eval Moderate Complexity: 1 Mod OT Treatments $Self Care/Home Management : 8-22 mins  Maiah Sinning MSOT, OTR/L Acute Rehab Pager: [phone] Office: [place] 10/12/2018, 4:52 PM

## 2018-10-12 NOTE — Discharge Summary (Signed)
Physician Discharge Summary  [name] DOB: [birth year] DOA: 10/10/2018  PCP: Nolene Ebbs, MD  Admit date: 10/10/2018 Discharge date: 10/12/2018  Admitted From: Home Disposition: Home  Recommendations for Outpatient Follow-up:  1. Follow up with PCP in 1 week 2. Please obtain BMP/CBC in one week 3. Please follow up on the following pending results: None  Home Health: PT (patient declines SNF) Equipment/Devices: Rolling walker  Discharge Condition: Stable CODE STATUS: Full code Diet recommendation: Heart healthy   Brief/Interim Summary:  Admission HPI  Chief Complaint: Tired of leg hurting  HPI: Jacqueline Reese is a 63 y.o. female with medical history significant of schizophrenia, diabetes mellitus, anxiety, depression, arthritis, hypertension, gout.  Patient presented secondary to 3 weeks of worsening leg pain, left greater than right.  She reports not being able to walk on her left leg more recently.  She has been using her home hydrocodone without much benefit.    Hospital course:  Cellulitis Unknown etiology. No evidence of abscess. Patient with significant pain and unable to ambulate well. No prior treatment as an outpatient. Blood culture no growth x 2 days prior to discharge. Empirically treated with Ceftriaxone (after receiving one dose of Vancomycin in the ED) and transitioned to Cefdinir on discharge. Seven day course.  Sepsis Present on admission. Secondary to leukocytosis and tachycardia. Likely secondary to above. Blood culture pending. Afebrile. Resolved.  Elevated CK Secondary to cellulitis. Mild elevation. Improved.  Elevated AST/ALT In setting of elevated CK. Mild elevation. Improved.  Diabetes mellitus, type 2 Patient is on metformin. Hemoglobin A1C of 6.9%. Resume metformin in one day.  Gout No flare. Continue allopurinol.  Essential hypertension Continue amlodipine, spironolactone  Mood  disorder Schizophrenia Continue Klonopin, Remeron, Buspar, Geodon  Discharge Diagnoses:  Active Problems:   Cellulitis    Discharge Instructions  Discharge Instructions    Call MD for:  difficulty breathing, headache or visual disturbances   Complete by: As directed    Call MD for:  severe uncontrolled pain   Complete by: As directed    Call MD for:  temperature >100.4   Complete by: As directed    Increase activity slowly   Complete by: As directed      Allergies as of 10/12/2018      Reactions   Lisinopril Swelling   Angioedema   Chantix [varenicline]    Doxycycline Other (See Comments)   Interactions with other medications   Ibuprofen Nausea And Vomiting   Lamisil [terbinafine] Hives   Cephalexin Rash   Sulfa Antibiotics Anxiety      Medication List    STOP taking these medications   acetaminophen 500 MG tablet Commonly known as: TYLENOL   diclofenac 75 MG EC tablet Commonly known as: VOLTAREN   naproxen 500 MG tablet Commonly known as: NAPROSYN   potassium chloride 10 MEQ tablet Commonly known as: K-DUR   potassium chloride SA 20 MEQ tablet Commonly known as: K-DUR   tiZANidine 4 MG tablet Commonly known as: Zanaflex     TAKE these medications   allopurinol 100 MG tablet Commonly known as: ZYLOPRIM TAKE 1 TABLET(100 MG) BY MOUTH TWICE DAILY What changed: See the new instructions.   amLODipine 5 MG tablet Commonly known as: NORVASC Take 5 mg by mouth daily.   aspirin EC 325 MG tablet Take 325 mg by mouth daily.   busPIRone 10 MG tablet Commonly known as: BUSPAR Take 10 mg by mouth daily.   cefdinir 300 MG capsule Commonly  known as: OMNICEF Take 1 capsule (300 mg total) by mouth 2 (two) times daily for 4 days. Start taking on: October 13, 2018   clonazePAM 1 MG tablet Commonly known as: KLONOPIN Take 1 mg by mouth 4 (four) times daily.   diclofenac sodium 1 % Gel Commonly known as: VOLTAREN Apply 2 g topically 4 (four) times  daily.   furosemide 40 MG tablet Commonly known as: LASIX Take 40 mg by mouth daily.   gabapentin 300 MG capsule Commonly known as: NEURONTIN Take 2 capsules (600 mg total) by mouth 3 (three) times daily.   HYDROcodone-acetaminophen 7.5-325 MG tablet Commonly known as: NORCO Take 1 tablet by mouth every 6 (six) hours as needed.   Linzess 145 MCG Caps capsule Generic drug: linaclotide Take 145 mcg by mouth daily.   meloxicam 15 MG tablet Commonly known as: MOBIC Take 1 tablet (15 mg total) by mouth daily. What changed:   when to take this  reasons to take this   metFORMIN 500 MG tablet Commonly known as: GLUCOPHAGE Take 1 tablet (500 mg total) by mouth 2 (two) times daily with a meal. Start taking on: October 14, 2018 What changed: These instructions start on October 14, 2018. If you are unsure what to do until then, ask your doctor or other care provider.   methocarbamol 750 MG tablet Commonly known as: ROBAXIN Take 1 tablet (750 mg total) by mouth 2 (two) times daily.   mirtazapine 30 MG tablet Commonly known as: REMERON Take 30 mg by mouth at bedtime.   predniSONE 10 MG tablet Commonly known as: DELTASONE Take 1 tablet (10 mg total) by mouth daily with breakfast.   rosuvastatin 5 MG tablet Commonly known as: CRESTOR Take 5 mg by mouth daily.   spironolactone 50 MG tablet Commonly known as: ALDACTONE Take 50 mg by mouth daily.   ziprasidone 80 MG capsule Commonly known as: GEODON Take 160 mg by mouth every evening.            Durable Medical Equipment  (From admission, onward)         Start     Ordered   10/12/18 1557  For home use only DME 3 n 1  Once    Comments: Length of need: lifetime   10/12/18 1557   10/12/18 1055  For home use only DME Walker rolling  Once    Question:  Patient needs a walker to treat with the following condition  Answer:  Cellulitis   10/12/18 1055          Allergies  Allergen Reactions   Lisinopril Swelling     Angioedema    Chantix [Varenicline]    Doxycycline Other (See Comments)    Interactions with other medications   Ibuprofen Nausea And Vomiting   Lamisil [Terbinafine] Hives   Cephalexin Rash   Sulfa Antibiotics Anxiety    Consultations:  None   Procedures/Studies: Dg Knee 1-2 Views Left  Result Date: 09/29/2018 CLINICAL DATA:  NO INJURY,CHRONIC HIP/KNEE PAIN,UNABLE TO STRAIGHTEN OUT LEFT KNEE EXAM: LEFT KNEE - 1-2 VIEW COMPARISON:  None. FINDINGS: There are degenerative changes in the patellofemoral compartment. No acute fracture or subluxation. No joint effusion. IMPRESSION: No evidence for acute abnormality. Electronically Signed   By: Nolon Nations M.D.   On: 09/29/2018 09:01   Dg Abd Portable 1v  Result Date: 10/12/2018 CLINICAL DATA:  Abdominal fullness. EXAM: PORTABLE ABDOMEN - 1 VIEW COMPARISON:  September 14, 2015 FINDINGS: The bowel gas pattern is  normal. No radio-opaque calculi or other significant radiographic abnormality are seen. Post cholecystectomy clips. Average stool burden. IMPRESSION: Negative. Electronically Signed   By: Fidela Salisbury M.D.   On: 10/12/2018 14:51   Ct Extremity Lower Left W Contrast  Result Date: 10/10/2018 CLINICAL DATA:  Leg swelling and leukocytosis EXAM: CT OF THE LOWER LEFT EXTREMITY WITH CONTRAST TECHNIQUE: Multidetector CT imaging of the lower left extremity was performed according to the standard protocol following intravenous contrast administration. COMPARISON:  None. CONTRAST:  114mL OMNIPAQUE IOHEXOL 300 MG/ML  SOLN FINDINGS: Bones/Joint/Cartilage No fracture or dislocation. There is mild medial and patellofemoral compartment osteoarthritis with joint space loss and marginal osteophyte formation. Enthesophytes seen at the superior patellar pole. There is also calcaneal enthesophytes seen. Normal bone mineralization seen throughout. No areas of cortical destruction or erosion are seen. Ligaments Suboptimally assessed by CT. Muscles  and Tendons There is mild fatty atrophy noted within the musculature surrounding the lower extremity. However the muscles appear to be intact. The tendons are intact. Soft tissues There is diffuse subcutaneous edema seen surrounding the lower extremity with areas of skin thickening. There is mild loculated fluid seen surrounding the mid tibia/fibula within the subcutaneous tissues. No loculated fluid collection or extension into the deep fascial layers is seen. IMPRESSION: Findings of cellulitis with phlegmon in the mid tibia/fibula subcutaneous tissues. No evidence of abscess or osteomyelitis. Electronically Signed   By: Prudencio Pair M.D.   On: 10/10/2018 16:22   Dg Hip Unilat W Or Wo Pelvis 2-3 Views Left  Result Date: 09/29/2018 CLINICAL DATA:  Chronic left hip pain. EXAM: DG HIP (WITH OR WITHOUT PELVIS) 2-3V LEFT COMPARISON:  Plain films of the pelvis and left hip 12/13/2010. FINDINGS: The patient has severe bilateral hip osteoarthritis with bone-on-bone joint space narrowing, remodeling of the femoral heads and extensive subchondral sclerosis. 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 Degenerative change appears slightly worse on the left. The appearance is much worse than on the prior plain films. Mild degenerative disease about the SI joints is noted. IMPRESSION: Severe bilateral hip osteoarthritis has markedly worsened since 2012 and is slightly more advanced on the left. Electronically Signed   By: Inge Rise M.D.   On: 09/29/2018 09:20   Vas [place] Lower Extremity Venous (dvt) (only [facility])  Result Date: 10/12/2018  Lower Venous Study Indications: Pain, and Edema.  Limitations: Body habitus and Edema. Comparison Study: Previous study 07/27/2017 negative bilaterally Performing Technologist: Toma Copier RVS  Examination Guidelines: A complete evaluation includes B-mode imaging, spectral Doppler, color Doppler, and power Doppler as needed of all accessible portions of each vessel. Bilateral  testing is considered an integral part of a complete examination. Limited examinations for reoccurring indications may be performed as noted.  +-----+---------------+---------+-----------+----------+-------+  RIGHT Compressibility Phasicity Spontaneity Properties Summary  +-----+---------------+---------+-----------+----------+-------+  CFV   Full            Yes       Yes                             +-----+---------------+---------+-----------+----------+-------+  SFJ   Full                                                      +-----+---------------+---------+-----------+----------+-------+   +---------+---------------+---------+-----------+----------+-------------------+  LEFT  Compressibility Phasicity Spontaneity Properties Summary              +---------+---------------+---------+-----------+----------+-------------------+  CFV       Full                                                                  +---------+---------------+---------+-----------+----------+-------------------+  SFJ       Full                                                                  +---------+---------------+---------+-----------+----------+-------------------+  FV Prox   Full                                                                  +---------+---------------+---------+-----------+----------+-------------------+  FV Mid    Full                                                                  +---------+---------------+---------+-----------+----------+-------------------+  FV Distal Full                                             Techncially                                                                      difficult to image   +---------+---------------+---------+-----------+----------+-------------------+  PFV       Full                                                                  +---------+---------------+---------+-----------+----------+-------------------+  POP       Full                                                                   +---------+---------------+---------+-----------+----------+-------------------+  PTV       Full                                                                  +---------+---------------+---------+-----------+----------+-------------------+  PERO                                                       Techncially                                                                      difficult to image                                                               fully                +---------+---------------+---------+-----------+----------+-------------------+   Left Technical Findings: Not visualized segments include Peroneal and distal femoral. Techncially difficult to image the distal femoral and peroneal veins due to body habitus and edema   Summary: Right: There is no evidence of a common femoral vein obstruction. Left: There is no evidence of deep vein thrombosis in the lower extremity. However, portions of this examination were limited- see technologist comments above. No cystic structure found in the popliteal fossa.  *See table(s) above for measurements and observations. Electronically signed by Harold Barban MD on 10/12/2018 at 12:34:26 PM.    Final      Subjective: Some leg pain. Improved.  Discharge Exam: Vitals:   10/12/18 0404 10/12/18 1126  BP: 121/69 106/79  Pulse: 99 97  Resp: 20 18  Temp: 99.5 F (37.5 C) 98.2 F (36.8 C)  SpO2: 97% 93%   Vitals:   10/11/18 1438 10/11/18 2037 10/12/18 0404 10/12/18 1126  BP: 111/70 111/64 121/69 106/79  Pulse: 98 87 99 97  Resp: 18 (!) 22 20 18   Temp: 98.8 F (37.1 C) 98.3 F (36.8 C) 99.5 F (37.5 C) 98.2 F (36.8 C)  TempSrc: Oral Oral Oral Oral  SpO2: 96% 97% 97% 93%  Weight:      Height:        General: Pt is alert, awake, not in acute distress Cardiovascular: RRR, S1/S2 +, no rubs, no gallops Respiratory: CTA bilaterally, no wheezing, no rhonchi Abdominal: Soft, NT, ND, bowel sounds + Extremities:   no cyanosis. Mild medial thigh edema    The results of significant diagnostics from this hospitalization (including imaging, microbiology, ancillary and laboratory) are listed below for reference.     Microbiology: Recent Results (from the past 240 hour(s))  Blood culture (routine x 2)     Status: None (Preliminary result)   Collection Time: 10/10/18  4:25 PM   Specimen: BLOOD RIGHT HAND  Result Value Ref Range Status   Specimen Description BLOOD RIGHT HAND  Final   Special Requests   Final    BOTTLES DRAWN AEROBIC AND ANAEROBIC Blood Culture results may not be optimal due to an inadequate volume of blood received in culture bottles   Culture   Final    NO GROWTH 2 DAYS Performed at [facility], [place]  [address] [place], [place] [address]    Report Status PENDING  Incomplete  Blood culture (routine x 2)     Status: None (Preliminary result)   Collection Time: 10/10/18  4:38 PM   Specimen: BLOOD  Result Value Ref Range Status   Specimen Description BLOOD L IV SITE  Final   Special Requests   Final    BOTTLES DRAWN AEROBIC AND ANAEROBIC Blood Culture results may not be optimal due to an inadequate volume of blood received in culture bottles   Culture   Final    NO GROWTH 2 DAYS Performed at [facility], [place] [address], [place], [place] [address]    Report Status PENDING  Incomplete  SARS CORONAVIRUS 2 Nasal Swab Aptima Multi Swab     Status: None   Collection Time: 10/10/18  5:20 PM   Specimen: Aptima Multi Swab; Nasal Swab  Result Value Ref Range Status   SARS Coronavirus 2 NEGATIVE NEGATIVE Final    Comment: (NOTE) SARS-CoV-2 target nucleic acids are NOT DETECTED. The SARS-CoV-2 RNA is generally detectable in upper and lower respiratory specimens during the acute phase of infection. Negative results do not preclude SARS-CoV-2 infection, do not rule out co-infections with other pathogens, and should not be used as the sole basis for treatment or other patient  management decisions. Negative results must be combined with clinical observations, patient history, and epidemiological information. The expected result is Negative. Fact Sheet for Patients: SugarRoll.be Fact Sheet for Healthcare Providers: https://www.woods-mathews.com/ This test is not yet approved or cleared by the [place] FDA and  has been authorized for detection and/or diagnosis of SARS-CoV-2 by FDA under an Emergency Use Authorization (EUA). This EUA will remain  in effect (meaning this test can be used) for the duration of the COVID-19 declaration under Section 56 4(b)(1) of the Act, 21 U.S.C. section 360bbb-3(b)(1), unless the authorization is terminated or revoked sooner. Performed at [facility], [place] [address], [place], [place] [address]      Labs: BNP (last 3 results) No results for input(s): BNP in the last 8760 hours. Basic Metabolic Panel: Recent Labs  Lab 10/10/18 0844 10/11/18 0236  NA 138 137  K 3.9 3.5  CL 104 103  CO2 19* 23  GLUCOSE 161* 91  BUN 17 10  CREATININE 0.78 0.54  CALCIUM 9.2 8.8*   Liver Function Tests: Recent Labs  Lab 10/10/18 1638 10/11/18 0236  AST 45* 33  ALT 46* 39  ALKPHOS 75 72  BILITOT 0.7 0.6  PROT 5.9* 5.3*  ALBUMIN 3.0* 2.8*   No results for input(s): LIPASE, AMYLASE in the last 168 hours. No results for input(s): AMMONIA in the last 168 hours. CBC: Recent Labs  Lab 10/10/18 0844 10/11/18 0236  WBC 22.0* 16.4*  NEUTROABS 19.5*  --   HGB 9.8* 8.7*  HCT 32.7* 28.7*  MCV 94.8 92.6  PLT 502* 485*   Cardiac Enzymes: Recent Labs  Lab 10/10/18 1638 10/11/18 0236  CKTOTAL 889* 702*   BNP: Invalid input(s): POCBNP CBG: Recent Labs  Lab 10/11/18 1209 10/11/18 1725 10/11/18 2034 10/12/18 0827 10/12/18 1224  GLUCAP 103* 115* 104* 159* 105*   D-Dimer No results for input(s): DDIMER in the last 72 hours. Hgb A1c Recent Labs    10/10/18 2116    HGBA1C 6.9*   Lipid Profile No results for input(s): CHOL, HDL, LDLCALC, TRIG, CHOLHDL, LDLDIRECT in the last 72 hours. Thyroid function studies No results for input(s): TSH, T4TOTAL, T3FREE, THYROIDAB  in the last 72 hours.  Invalid input(s): FREET3 Anemia work up No results for input(s): VITAMINB12, FOLATE, FERRITIN, TIBC, IRON, RETICCTPCT in the last 72 hours. Urinalysis    Component Value Date/Time   COLORURINE AMBER (A) 10/24/2017 1902   APPEARANCEUR HAZY (A) 10/24/2017 1902   LABSPEC 1.028 10/24/2017 1902   PHURINE 5.0 10/24/2017 1902   GLUCOSEU NEGATIVE 10/24/2017 1902   HGBUR NEGATIVE 10/24/2017 1902   HGBUR negative 04/27/2008 0825   BILIRUBINUR NEGATIVE 10/24/2017 1902   KETONESUR 5 (A) 10/24/2017 1902   PROTEINUR NEGATIVE 10/24/2017 1902   UROBILINOGEN 0.2 09/13/2013 0926   NITRITE NEGATIVE 10/24/2017 1902   LEUKOCYTESUR NEGATIVE 10/24/2017 1902   Sepsis Labs Invalid input(s): PROCALCITONIN,  WBC,  LACTICIDVEN Microbiology Recent Results (from the past 240 hour(s))  Blood culture (routine x 2)     Status: None (Preliminary result)   Collection Time: 10/10/18  4:25 PM   Specimen: BLOOD RIGHT HAND  Result Value Ref Range Status   Specimen Description BLOOD RIGHT HAND  Final   Special Requests   Final    BOTTLES DRAWN AEROBIC AND ANAEROBIC Blood Culture results may not be optimal due to an inadequate volume of blood received in culture bottles   Culture   Final    NO GROWTH 2 DAYS Performed at [facility], [place] [address], [place], [place] [address]    Report Status PENDING  Incomplete  Blood culture (routine x 2)     Status: None (Preliminary result)   Collection Time: 10/10/18  4:38 PM   Specimen: BLOOD  Result Value Ref Range Status   Specimen Description BLOOD L IV SITE  Final   Special Requests   Final    BOTTLES DRAWN AEROBIC AND ANAEROBIC Blood Culture results may not be optimal due to an inadequate volume of blood received in culture bottles    Culture   Final    NO GROWTH 2 DAYS Performed at [facility], [place] [address], [place], [place] [address]    Report Status PENDING  Incomplete  SARS CORONAVIRUS 2 Nasal Swab Aptima Multi Swab     Status: None   Collection Time: 10/10/18  5:20 PM   Specimen: Aptima Multi Swab; Nasal Swab  Result Value Ref Range Status   SARS Coronavirus 2 NEGATIVE NEGATIVE Final    Comment: (NOTE) SARS-CoV-2 target nucleic acids are NOT DETECTED. The SARS-CoV-2 RNA is generally detectable in upper and lower respiratory specimens during the acute phase of infection. Negative results do not preclude SARS-CoV-2 infection, do not rule out co-infections with other pathogens, and should not be used as the sole basis for treatment or other patient management decisions. Negative results must be combined with clinical observations, patient history, and epidemiological information. The expected result is Negative. Fact Sheet for Patients: SugarRoll.be Fact Sheet for Healthcare Providers: https://www.woods-mathews.com/ This test is not yet approved or cleared by the [place] FDA and  has been authorized for detection and/or diagnosis of SARS-CoV-2 by FDA under an Emergency Use Authorization (EUA). This EUA will remain  in effect (meaning this test can be used) for the duration of the COVID-19 declaration under Section 56 4(b)(1) of the Act, 21 U.S.C. section 360bbb-3(b)(1), unless the authorization is terminated or revoked sooner. Performed at [facility], [place] [address] [place],  [address]      Time coordinating discharge: 35 minutes  SIGNED:   Cordelia Poche, MD [facility] 10/12/2018, 4:34 PM

## 2018-10-13 ENCOUNTER — Ambulatory Visit: Payer: Medicare HMO | Admitting: Orthopaedic Surgery

## 2018-10-13 NOTE — Telephone Encounter (Signed)
Is surgery something you would do?

## 2018-10-13 NOTE — Telephone Encounter (Signed)
She is actually on the younger side of things at age 64.  This would usually take about 4 to 6 weeks to recover from for most people.

## 2018-10-14 ENCOUNTER — Telehealth: Payer: Self-pay | Admitting: Orthopedic Surgery

## 2018-10-14 NOTE — Telephone Encounter (Signed)
  She does need clearance from her PCP and does need to also be seen in our office again since she was in the hospital recently for cellulitis of her leg and due to the fact that she refused surgery before.

## 2018-10-14 NOTE — Telephone Encounter (Signed)
I have emailed [facility] to see if they will accept the referral for the pt. I have another message on her and will sign off on this one and call once I hear back from home health office.

## 2018-10-14 NOTE — Telephone Encounter (Signed)
Patient called requesting a referral for her to get a home health aid.  She said that she tried [facility], but they are not able to do it.  XF#[phone].  Thank you.

## 2018-10-14 NOTE — Telephone Encounter (Signed)
Patient called advised she got out of the hospital 10/12/2018 and she is still not walking. Patient said she has swelling in her legs and feet. Patient asked if she can get some assistant at home like before. Patient asked if he Dr Sharol Given can send an order for her to get some help? The number to contact patient is [phone]

## 2018-10-14 NOTE — Telephone Encounter (Signed)
I mean with her medical history, would this be something you are comfortable with?

## 2018-10-14 NOTE — Telephone Encounter (Signed)
I have emailed [facility] to see if they can pick up the pt for home health physical therapy and home health aid. Will hold this message awaiting advisement

## 2018-10-15 ENCOUNTER — Telehealth: Payer: Self-pay | Admitting: Orthopedic Surgery

## 2018-10-15 ENCOUNTER — Telehealth: Payer: Self-pay | Admitting: *Deleted

## 2018-10-15 ENCOUNTER — Other Ambulatory Visit: Payer: Self-pay

## 2018-10-15 LAB — CULTURE, BLOOD (ROUTINE X 2)
Culture: NO GROWTH
Culture: NO GROWTH

## 2018-10-15 NOTE — Telephone Encounter (Signed)
TOC CM received call and states she has not received her 3n1 bedside commode and that she wants a wheelchair. States she is unable to walk. Has an older walker at home. Contacted [facility] and [facility] and they are not in network with her plan. Contacted [facility] with new referral. Able to accept referral. Will have 3n1 bedside commode shipped to home from [place]. Message sent to [facility], rep for [place]. The community [employer] DME rep will follow up with Dr Sharol Given office for order for wheelchair and they will deliver both wheelchair and 3n1 bedside commode to home. Message sent to Dr Sharol Given. Jonnie Finner RN CCM Case Mgmt phone [phone]

## 2018-10-15 NOTE — Telephone Encounter (Signed)
Patient called asked if she can get a bed side commode and a wheelchair. Patient said she weigh 185lb.  Patient asked if [facility] will call her. The number to contact patient is [phone]

## 2018-10-15 NOTE — Telephone Encounter (Signed)
Patient aware of the below  

## 2018-10-15 NOTE — Telephone Encounter (Signed)
Faxed over order for PT,OT and MSW to [facility] ( [facility] was unable to take patient) order also faxed to [facility] [place] services for wheelchair and bedside commode.i called and sw pt to advise.

## 2018-10-15 NOTE — Telephone Encounter (Signed)
Faxed order to [place] services for [facility] and bedside commode with demo sheet

## 2018-10-15 NOTE — Telephone Encounter (Signed)
Pt also asked about refill on pain medication. I advised that she receives pain medication from pain management in [place] and that she will have to call their office because we are not able to give any additional pain medications. She voiced understanding and will call their office to address this.

## 2018-10-17 ENCOUNTER — Telehealth: Payer: Self-pay | Admitting: Orthopedic Surgery

## 2018-10-17 ENCOUNTER — Telehealth: Payer: Self-pay | Admitting: Radiology

## 2018-10-17 NOTE — Telephone Encounter (Signed)
Patient called needing Rx refilled (Tramadol) and something for the swelling in her legs. The number to contact patient is [phone]

## 2018-10-17 NOTE — Telephone Encounter (Signed)
[name] from [facility] called and states that they will be out to start treatment on [facility] on 10/22/2018

## 2018-10-17 NOTE — Telephone Encounter (Signed)
I called pt and advised that we can not give any pain medication. She states that her legs are swelling and I advised that she needs call her PCP for an appt. Advised that the DME that she has requested an order has been faxed to [facility] and [facility] has gotten in touch with the pt and will see her on Tuesday.

## 2018-10-21 ENCOUNTER — Telehealth: Payer: Self-pay | Admitting: Orthopedic Surgery

## 2018-10-21 NOTE — Telephone Encounter (Signed)
Patient called advised she received the shower chair but it did not have a back on it.  The number to contact patient is [phone]

## 2018-10-22 ENCOUNTER — Telehealth: Payer: Self-pay | Admitting: Orthopedic Surgery

## 2018-10-22 NOTE — Telephone Encounter (Signed)
I called pt and she states that it looks like a piece is missing from the chair I advised that she can call [facility] to discuss and pt states that she is not near anything that she can write the number down. She also states that she has not received the wheelchair and I advised that I will check the status on this. [facility] # [id number]. I called and there was a form that was faxed to the office to request additional information why the pt was needing a wheelchair after insurance had already paid for a rollator for the pt. There needs to be a different diagnosis for this to be covered. I asked if he could refax the form to my attention and that I would fill this out and fax it back.

## 2018-10-22 NOTE — Telephone Encounter (Signed)
Patient called stating that she received her bedside, but has not received the wheelchair.  Thank you.

## 2018-10-23 NOTE — Telephone Encounter (Signed)
Patient was called and she stated that she was getting ready for a bathe and she is not near a pen and paper and if we could give her a call back later.

## 2018-10-28 NOTE — Telephone Encounter (Signed)
Patient called back to get the information in regards to her wheelchair.  CB#[phone].  Thank you.

## 2018-10-28 NOTE — Telephone Encounter (Signed)
I called pt and advised that insurance will not cover the wheelchair because she already received the rollator walker.

## 2018-10-29 ENCOUNTER — Telehealth: Payer: Self-pay | Admitting: Orthopedic Surgery

## 2018-10-29 NOTE — Telephone Encounter (Signed)
Patient called wanting to know if there was a referral for the patient to have an aid with her during the day.  [id number].  Thank you.

## 2018-10-30 ENCOUNTER — Telehealth: Payer: Self-pay | Admitting: Orthopedic Surgery

## 2018-10-30 NOTE — Telephone Encounter (Signed)
This is a duplicate message I have one in box pending return call will sign off on this message.

## 2018-10-30 NOTE — Telephone Encounter (Signed)
HH called back and pt is not receiving services with their office. I have tried [facility] and [facility] and neither agency is providing service to the pt. I tried to call the pt and the line is busy I tried to call the pt's daughter and that line is busy. This has been an ongoing problem with confusion on who is providing services. I will hold message and try pt back again later on today.

## 2018-10-30 NOTE — Telephone Encounter (Signed)
I called [facility] [phone] and had to leave a message on vm I am trying to see if they provide services for the pt and if so could we add order for an aid to come to the house several times a week. I will hold this message pending a return call.

## 2018-10-30 NOTE — Telephone Encounter (Signed)
I called and sw therapist with [facility] they are seeing the pt NOT [facility]. He is physical therapist that is seeing the pt twice a week and she already has an aid coming twice a week as well. The pt has been advised while the therapist is in the home that if she wants an aid to come out daily to sit with her then this will be an out of pocket expense. Advised that she is calling stating that she is not getting any services at all and in fact for the past three weeks they have been coming out twice a week. Not making goal and does not know if they will be able to continue to see her. Per therapist. To call with any questions again advise that an aid will not be covered for daily care and this will be an out of pocket expense.

## 2018-10-30 NOTE — Telephone Encounter (Signed)
Patient called and stated needed [facility] to start coming tomorrow because she needs someone there with her...daughter going to work. [facility] is wanting order faxed asap.  Please call patient @ [phone]

## 2018-12-01 ENCOUNTER — Other Ambulatory Visit (INDEPENDENT_AMBULATORY_CARE_PROVIDER_SITE_OTHER): Payer: Self-pay | Admitting: Orthopedic Surgery

## 2019-01-04 DEATH — deceased
